# Patient Record
Sex: Male | Born: 1940
Health system: Southern US, Community
[De-identification: ages and names within clinical notes are randomized; demographics above are authoritative.]

## PROBLEM LIST (undated history)

## (undated) ENCOUNTER — Ambulatory Visit (HOSPITAL_COMMUNITY): Admission: EM | Source: Home / Self Care

## (undated) DIAGNOSIS — E785 Hyperlipidemia, unspecified: Secondary | ICD-10-CM

## (undated) DIAGNOSIS — A048 Other specified bacterial intestinal infections: Secondary | ICD-10-CM

## (undated) DIAGNOSIS — D126 Benign neoplasm of colon, unspecified: Secondary | ICD-10-CM

## (undated) DIAGNOSIS — M1990 Unspecified osteoarthritis, unspecified site: Secondary | ICD-10-CM

## (undated) DIAGNOSIS — F329 Major depressive disorder, single episode, unspecified: Secondary | ICD-10-CM

## (undated) DIAGNOSIS — F32A Depression, unspecified: Secondary | ICD-10-CM

## (undated) DIAGNOSIS — I2510 Atherosclerotic heart disease of native coronary artery without angina pectoris: Secondary | ICD-10-CM

## (undated) DIAGNOSIS — I10 Essential (primary) hypertension: Secondary | ICD-10-CM

## (undated) DIAGNOSIS — A498 Other bacterial infections of unspecified site: Secondary | ICD-10-CM

## (undated) DIAGNOSIS — I219 Acute myocardial infarction, unspecified: Secondary | ICD-10-CM

## (undated) DIAGNOSIS — K5790 Diverticulosis of intestine, part unspecified, without perforation or abscess without bleeding: Secondary | ICD-10-CM

## (undated) HISTORY — DX: Atherosclerotic heart disease of native coronary artery without angina pectoris: I25.10

## (undated) HISTORY — DX: Benign neoplasm of colon, unspecified: D12.6

## (undated) HISTORY — DX: Other bacterial infections of unspecified site: A49.8

## (undated) HISTORY — DX: Diverticulosis of intestine, part unspecified, without perforation or abscess without bleeding: K57.90

## (undated) HISTORY — PX: JOINT REPLACEMENT: SHX530

## (undated) HISTORY — PX: COLONOSCOPY: SHX174

## (undated) HISTORY — DX: Hyperlipidemia, unspecified: E78.5

## (undated) HISTORY — DX: Unspecified osteoarthritis, unspecified site: M19.90

## (undated) HISTORY — DX: Major depressive disorder, single episode, unspecified: F32.9

## (undated) HISTORY — PX: TOTAL KNEE ARTHROPLASTY: SHX125

## (undated) HISTORY — DX: Other specified bacterial intestinal infections: A04.8

## (undated) HISTORY — DX: Essential (primary) hypertension: I10

## (undated) HISTORY — DX: Depression, unspecified: F32.A

## (undated) HISTORY — DX: Acute myocardial infarction, unspecified: I21.9

## (undated) HISTORY — PX: TONSILLECTOMY: SUR1361

## (undated) HISTORY — PX: CARDIAC CATHETERIZATION: SHX172

## (undated) HISTORY — PX: CORONARY ANGIOPLASTY WITH STENT PLACEMENT: SHX49

---
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
## 1994-06-06 HISTORY — PX: OTHER SURGICAL HISTORY: SHX169

## 1998-01-02 ENCOUNTER — Ambulatory Visit (HOSPITAL_COMMUNITY): Admission: RE | Admit: 1998-01-02 | Discharge: 1998-01-02 | Payer: Self-pay

## 1998-01-22 ENCOUNTER — Ambulatory Visit (HOSPITAL_COMMUNITY): Admission: RE | Admit: 1998-01-22 | Discharge: 1998-01-22 | Payer: Self-pay

## 1998-02-17 ENCOUNTER — Ambulatory Visit (HOSPITAL_BASED_OUTPATIENT_CLINIC_OR_DEPARTMENT_OTHER): Admission: RE | Admit: 1998-02-17 | Discharge: 1998-02-17 | Payer: Self-pay | Admitting: Orthopaedic Surgery

## 1998-03-17 ENCOUNTER — Emergency Department (HOSPITAL_COMMUNITY): Admission: EM | Admit: 1998-03-17 | Discharge: 1998-03-17 | Payer: Self-pay | Admitting: Emergency Medicine

## 1998-03-17 ENCOUNTER — Encounter: Payer: Self-pay | Admitting: Emergency Medicine

## 1998-09-09 ENCOUNTER — Inpatient Hospital Stay (HOSPITAL_COMMUNITY): Admission: RE | Admit: 1998-09-09 | Discharge: 1998-09-12 | Payer: Self-pay | Admitting: Orthopaedic Surgery

## 1998-09-23 ENCOUNTER — Ambulatory Visit (HOSPITAL_COMMUNITY): Admission: RE | Admit: 1998-09-23 | Discharge: 1998-09-23 | Payer: Self-pay | Admitting: Orthopaedic Surgery

## 1998-10-19 ENCOUNTER — Ambulatory Visit (HOSPITAL_COMMUNITY): Admission: RE | Admit: 1998-10-19 | Discharge: 1998-10-19 | Payer: Self-pay | Admitting: Orthopaedic Surgery

## 2000-05-24 ENCOUNTER — Ambulatory Visit (HOSPITAL_COMMUNITY): Admission: RE | Admit: 2000-05-24 | Discharge: 2000-05-25 | Payer: Self-pay | Admitting: Cardiology

## 2000-05-24 ENCOUNTER — Encounter: Payer: Self-pay | Admitting: Cardiology

## 2000-07-07 ENCOUNTER — Ambulatory Visit (HOSPITAL_COMMUNITY): Admission: RE | Admit: 2000-07-07 | Discharge: 2000-07-07 | Payer: Self-pay | Admitting: Orthopedic Surgery

## 2000-07-07 ENCOUNTER — Encounter: Payer: Self-pay | Admitting: Orthopedic Surgery

## 2002-10-07 ENCOUNTER — Encounter: Payer: Self-pay | Admitting: Orthopedic Surgery

## 2002-10-07 ENCOUNTER — Ambulatory Visit (HOSPITAL_COMMUNITY): Admission: RE | Admit: 2002-10-07 | Discharge: 2002-10-07 | Payer: Self-pay | Admitting: Orthopedic Surgery

## 2003-12-30 ENCOUNTER — Encounter (INDEPENDENT_AMBULATORY_CARE_PROVIDER_SITE_OTHER): Payer: Self-pay | Admitting: *Deleted

## 2003-12-30 ENCOUNTER — Ambulatory Visit (HOSPITAL_COMMUNITY): Admission: RE | Admit: 2003-12-30 | Discharge: 2003-12-30 | Payer: Self-pay | Admitting: *Deleted

## 2004-06-09 ENCOUNTER — Ambulatory Visit: Payer: Self-pay | Admitting: Cardiology

## 2004-06-09 ENCOUNTER — Inpatient Hospital Stay (HOSPITAL_COMMUNITY): Admission: EM | Admit: 2004-06-09 | Discharge: 2004-06-10 | Payer: Self-pay | Admitting: Emergency Medicine

## 2004-06-21 ENCOUNTER — Ambulatory Visit: Payer: Self-pay | Admitting: Cardiology

## 2005-04-28 ENCOUNTER — Emergency Department (HOSPITAL_COMMUNITY): Admission: EM | Admit: 2005-04-28 | Discharge: 2005-04-28 | Payer: Self-pay | Admitting: Emergency Medicine

## 2005-09-22 ENCOUNTER — Ambulatory Visit: Payer: Self-pay | Admitting: Cardiology

## 2005-09-28 ENCOUNTER — Ambulatory Visit: Payer: Self-pay | Admitting: Cardiology

## 2006-05-24 ENCOUNTER — Encounter: Admission: RE | Admit: 2006-05-24 | Discharge: 2006-05-24 | Payer: Self-pay | Admitting: *Deleted

## 2006-11-30 ENCOUNTER — Ambulatory Visit: Payer: Self-pay | Admitting: Cardiology

## 2006-11-30 LAB — CONVERTED CEMR LAB
ALT: 17 units/L (ref 0–53)
AST: 18 units/L (ref 0–37)
Albumin: 3.7 g/dL (ref 3.5–5.2)
Alkaline Phosphatase: 76 units/L (ref 39–117)
Bilirubin, Direct: 0.1 mg/dL (ref 0.0–0.3)
Cholesterol: 244 mg/dL (ref 0–200)
Direct LDL: 156.7 mg/dL
HDL: 35.9 mg/dL — ABNORMAL LOW (ref >39.0)
Total Bilirubin: 1.2 mg/dL (ref 0.3–1.2)
Total CHOL/HDL Ratio: 6.8
Total Protein: 6.8 g/dL (ref 6.0–8.3)
Triglycerides: 226 mg/dL (ref 0–149)
VLDL: 45 mg/dL — ABNORMAL HIGH (ref 0–40)

## 2007-09-27 ENCOUNTER — Ambulatory Visit (HOSPITAL_COMMUNITY): Admission: RE | Admit: 2007-09-27 | Discharge: 2007-09-27 | Payer: Self-pay | Admitting: *Deleted

## 2007-09-27 ENCOUNTER — Encounter (INDEPENDENT_AMBULATORY_CARE_PROVIDER_SITE_OTHER): Payer: Self-pay | Admitting: *Deleted

## 2007-12-04 ENCOUNTER — Ambulatory Visit: Payer: Self-pay | Admitting: Cardiology

## 2007-12-10 ENCOUNTER — Ambulatory Visit: Payer: Self-pay | Admitting: Cardiology

## 2007-12-10 LAB — CONVERTED CEMR LAB
ALT: 15 units/L (ref 0–53)
AST: 20 units/L (ref 0–37)
Albumin: 3.5 g/dL (ref 3.5–5.2)
Alkaline Phosphatase: 81 units/L (ref 39–117)
Bilirubin, Direct: 0.1 mg/dL (ref 0.0–0.3)
Cholesterol: 222 mg/dL (ref 0–200)
Direct LDL: 159.1 mg/dL
HDL: 37.9 mg/dL — ABNORMAL LOW (ref >39.0)
Total Bilirubin: 0.7 mg/dL (ref 0.3–1.2)
Total CHOL/HDL Ratio: 5.9
Total Protein: 6.6 g/dL (ref 6.0–8.3)
Triglycerides: 131 mg/dL (ref 0–149)
VLDL: 26 mg/dL (ref 0–40)

## 2008-12-25 DIAGNOSIS — M129 Arthropathy, unspecified: Secondary | ICD-10-CM | POA: Insufficient documentation

## 2008-12-25 DIAGNOSIS — E785 Hyperlipidemia, unspecified: Secondary | ICD-10-CM

## 2008-12-25 DIAGNOSIS — F329 Major depressive disorder, single episode, unspecified: Secondary | ICD-10-CM

## 2008-12-25 DIAGNOSIS — I252 Old myocardial infarction: Secondary | ICD-10-CM | POA: Insufficient documentation

## 2008-12-25 DIAGNOSIS — I2510 Atherosclerotic heart disease of native coronary artery without angina pectoris: Secondary | ICD-10-CM

## 2008-12-25 DIAGNOSIS — I10 Essential (primary) hypertension: Secondary | ICD-10-CM | POA: Insufficient documentation

## 2008-12-29 ENCOUNTER — Ambulatory Visit: Payer: Self-pay | Admitting: Cardiology

## 2008-12-29 DIAGNOSIS — R519 Headache, unspecified: Secondary | ICD-10-CM | POA: Insufficient documentation

## 2008-12-29 DIAGNOSIS — R51 Headache: Secondary | ICD-10-CM

## 2008-12-31 ENCOUNTER — Telehealth (INDEPENDENT_AMBULATORY_CARE_PROVIDER_SITE_OTHER): Payer: Self-pay | Admitting: *Deleted

## 2009-01-01 ENCOUNTER — Ambulatory Visit: Payer: Self-pay

## 2009-01-01 ENCOUNTER — Encounter: Payer: Self-pay | Admitting: Internal Medicine

## 2009-01-27 ENCOUNTER — Ambulatory Visit: Payer: Self-pay | Admitting: Cardiology

## 2009-03-11 ENCOUNTER — Encounter (INDEPENDENT_AMBULATORY_CARE_PROVIDER_SITE_OTHER): Payer: Self-pay | Admitting: *Deleted

## 2009-03-12 ENCOUNTER — Ambulatory Visit: Payer: Self-pay | Admitting: Cardiology

## 2009-03-13 ENCOUNTER — Encounter: Admission: RE | Admit: 2009-03-13 | Discharge: 2009-03-13 | Payer: Self-pay | Admitting: Orthopedic Surgery

## 2009-06-23 ENCOUNTER — Ambulatory Visit: Payer: Self-pay | Admitting: Cardiology

## 2009-06-24 LAB — CONVERTED CEMR LAB
ALT: 19 units/L (ref 0–53)
AST: 21 units/L (ref 0–37)
Albumin: 3.8 g/dL (ref 3.5–5.2)
Alkaline Phosphatase: 86 units/L (ref 39–117)
Bilirubin, Direct: 0.1 mg/dL (ref 0.0–0.3)
Cholesterol: 181 mg/dL (ref 0–200)
HDL: 41.4 mg/dL (ref >39.00)
LDL Cholesterol: 116 mg/dL — ABNORMAL HIGH (ref 0–99)
Total Bilirubin: 1 mg/dL (ref 0.3–1.2)
Total CHOL/HDL Ratio: 4
Total Protein: 6.8 g/dL (ref 6.0–8.3)
Triglycerides: 117 mg/dL (ref 0.0–149.0)
VLDL: 23.4 mg/dL (ref 0.0–40.0)

## 2009-06-25 ENCOUNTER — Ambulatory Visit: Payer: Self-pay | Admitting: Cardiology

## 2009-08-29 ENCOUNTER — Ambulatory Visit (HOSPITAL_COMMUNITY): Admission: EM | Admit: 2009-08-29 | Discharge: 2009-08-29 | Payer: Self-pay | Admitting: Emergency Medicine

## 2009-12-21 ENCOUNTER — Encounter: Payer: Self-pay | Admitting: Cardiology

## 2010-03-02 ENCOUNTER — Ambulatory Visit: Payer: Self-pay | Admitting: Cardiology

## 2010-05-07 ENCOUNTER — Encounter: Payer: Self-pay | Admitting: Cardiology

## 2010-05-21 ENCOUNTER — Encounter: Payer: Self-pay | Admitting: Physician Assistant

## 2010-05-21 ENCOUNTER — Ambulatory Visit: Payer: Self-pay | Admitting: Cardiology

## 2010-06-11 ENCOUNTER — Telehealth (INDEPENDENT_AMBULATORY_CARE_PROVIDER_SITE_OTHER): Payer: Self-pay | Admitting: *Deleted

## 2010-06-17 ENCOUNTER — Inpatient Hospital Stay (HOSPITAL_COMMUNITY)
Admission: RE | Admit: 2010-06-17 | Discharge: 2010-06-19 | Payer: Self-pay | Source: Home / Self Care | Attending: Orthopedic Surgery | Admitting: Orthopedic Surgery

## 2010-06-21 LAB — DIFFERENTIAL
Basophils Absolute: 0 10*3/uL (ref 0.0–0.1)
Basophils Relative: 1 % (ref 0–1)
Eosinophils Absolute: 0.1 10*3/uL (ref 0.0–0.7)
Eosinophils Relative: 1 % (ref 0–5)
Lymphocytes Relative: 31 % (ref 12–46)
Lymphs Abs: 2 10*3/uL (ref 0.7–4.0)
Monocytes Absolute: 0.6 10*3/uL (ref 0.1–1.0)
Monocytes Relative: 10 % (ref 3–12)
Neutro Abs: 3.7 10*3/uL (ref 1.7–7.7)
Neutrophils Relative %: 57 % (ref 43–77)

## 2010-06-21 LAB — BASIC METABOLIC PANEL
BUN: 18 mg/dL (ref 6–23)
BUN: 16 mg/dL (ref 6–23)
CO2: 29 mEq/L (ref 19–32)
CO2: 27 mEq/L (ref 19–32)
Calcium: 8.1 mg/dL — ABNORMAL LOW (ref 8.4–10.5)
Calcium: 8.4 mg/dL (ref 8.4–10.5)
Chloride: 101 mEq/L (ref 96–112)
Chloride: 107 mEq/L (ref 96–112)
Creatinine, Ser: 1.15 mg/dL (ref 0.4–1.5)
Creatinine, Ser: 0.97 mg/dL (ref 0.4–1.5)
GFR calc Af Amer: >60 mL/min (ref >60)
GFR calc Af Amer: >60 mL/min (ref >60)
GFR calc non Af Amer: >60 mL/min (ref >60)
GFR calc non Af Amer: >60 mL/min (ref >60)
Glucose, Bld: 161 mg/dL — ABNORMAL HIGH (ref 70–99)
Glucose, Bld: 138 mg/dL — ABNORMAL HIGH (ref 70–99)
Potassium: 4.3 mEq/L (ref 3.5–5.1)
Potassium: 4.3 mEq/L (ref 3.5–5.1)
Sodium: 137 mEq/L (ref 135–145)
Sodium: 141 mEq/L (ref 135–145)

## 2010-06-21 LAB — CBC
HCT: 46.1 % (ref 39.0–52.0)
HCT: 33.7 % — ABNORMAL LOW (ref 39.0–52.0)
HCT: 31.6 % — ABNORMAL LOW (ref 39.0–52.0)
Hemoglobin: 15.3 g/dL (ref 13.0–17.0)
Hemoglobin: 11.1 g/dL — ABNORMAL LOW (ref 13.0–17.0)
Hemoglobin: 10.6 g/dL — ABNORMAL LOW (ref 13.0–17.0)
MCH: 30.6 pg (ref 26.0–34.0)
MCH: 30.2 pg (ref 26.0–34.0)
MCH: 30.3 pg (ref 26.0–34.0)
MCHC: 33.2 g/dL (ref 30.0–36.0)
MCHC: 32.9 g/dL (ref 30.0–36.0)
MCHC: 33.5 g/dL (ref 30.0–36.0)
MCV: 92.2 fL (ref 78.0–100.0)
MCV: 91.8 fL (ref 78.0–100.0)
MCV: 90.3 fL (ref 78.0–100.0)
Platelets: 229 10*3/uL (ref 150–400)
Platelets: 200 10*3/uL (ref 150–400)
Platelets: 211 10*3/uL (ref 150–400)
RBC: 5 MIL/uL (ref 4.22–5.81)
RBC: 3.67 MIL/uL — ABNORMAL LOW (ref 4.22–5.81)
RBC: 3.5 MIL/uL — ABNORMAL LOW (ref 4.22–5.81)
RDW: 14.4 % (ref 11.5–15.5)
RDW: 14.3 % (ref 11.5–15.5)
RDW: 14.6 % (ref 11.5–15.5)
WBC: 6.5 10*3/uL (ref 4.0–10.5)
WBC: 12.7 10*3/uL — ABNORMAL HIGH (ref 4.0–10.5)
WBC: 13.2 10*3/uL — ABNORMAL HIGH (ref 4.0–10.5)

## 2010-06-21 LAB — COMPREHENSIVE METABOLIC PANEL
ALT: 20 U/L (ref 0–53)
AST: 18 U/L (ref 0–37)
Albumin: 3.8 g/dL (ref 3.5–5.2)
Alkaline Phosphatase: 101 U/L (ref 39–117)
BUN: 16 mg/dL (ref 6–23)
CO2: 29 mEq/L (ref 19–32)
Calcium: 9.2 mg/dL (ref 8.4–10.5)
Chloride: 106 mEq/L (ref 96–112)
Creatinine, Ser: 1.02 mg/dL (ref 0.4–1.5)
GFR calc Af Amer: >60 mL/min (ref >60)
GFR calc non Af Amer: >60 mL/min (ref >60)
Glucose, Bld: 97 mg/dL (ref 70–99)
Potassium: 4.2 mEq/L (ref 3.5–5.1)
Sodium: 142 mEq/L (ref 135–145)
Total Bilirubin: 0.7 mg/dL (ref 0.3–1.2)
Total Protein: 7.4 g/dL (ref 6.0–8.3)

## 2010-06-21 LAB — URINALYSIS, ROUTINE W REFLEX MICROSCOPIC
Bilirubin Urine: NEGATIVE
Hgb urine dipstick: NEGATIVE
Ketones, ur: NEGATIVE mg/dL
Nitrite: NEGATIVE
Protein, ur: NEGATIVE mg/dL
Specific Gravity, Urine: 1.025 (ref 1.005–1.030)
Urine Glucose, Fasting: NEGATIVE mg/dL
Urobilinogen, UA: 1 mg/dL (ref 0.0–1.0)
pH: 5.5 (ref 5.0–8.0)

## 2010-06-21 LAB — PROTIME-INR
INR: 0.95 (ref 0.00–1.49)
Prothrombin Time: 12.9 seconds (ref 11.6–15.2)

## 2010-06-21 LAB — TYPE AND SCREEN
ABO/RH(D): A POS
Antibody Screen: NEGATIVE

## 2010-06-21 LAB — APTT: aPTT: 31 seconds (ref 24–37)

## 2010-06-21 LAB — SURGICAL PCR SCREEN
MRSA, PCR: NEGATIVE
Staphylococcus aureus: NEGATIVE

## 2010-06-21 LAB — ABO/RH: ABO/RH(D): A POS

## 2010-06-27 ENCOUNTER — Encounter: Payer: Self-pay | Admitting: Orthopedic Surgery

## 2010-07-04 LAB — CONVERTED CEMR LAB
ALT: 17 units/L (ref 0–53)
ALT: 19 units/L (ref 0–53)
AST: 18 units/L (ref 0–37)
AST: 21 units/L (ref 0–37)
Albumin: 3.7 g/dL (ref 3.5–5.2)
Albumin: 3.7 g/dL (ref 3.5–5.2)
Alkaline Phosphatase: 72 units/L (ref 39–117)
Alkaline Phosphatase: 86 units/L (ref 39–117)
BUN: 23 mg/dL (ref 6–23)
Basophils Absolute: 0 10*3/uL (ref 0.0–0.1)
Basophils Relative: 0.7 % (ref 0.0–3.0)
Bilirubin, Direct: 0 mg/dL (ref 0.0–0.3)
Bilirubin, Direct: 0.1 mg/dL (ref 0.0–0.3)
CO2: 28 meq/L (ref 19–32)
Calcium: 8.9 mg/dL (ref 8.4–10.5)
Chloride: 106 meq/L (ref 96–112)
Cholesterol: 166 mg/dL (ref 0–200)
Cholesterol: 179 mg/dL (ref 0–200)
Creatinine, Ser: 1 mg/dL (ref 0.4–1.5)
Eosinophils Absolute: 0.1 10*3/uL (ref 0.0–0.7)
Eosinophils Relative: 1.8 % (ref 0.0–5.0)
GFR calc non Af Amer: 79.31 mL/min (ref >60)
Glucose, Bld: 119 mg/dL — ABNORMAL HIGH (ref 70–99)
HCT: 44.3 % (ref 39.0–52.0)
HDL: 35.4 mg/dL — ABNORMAL LOW (ref >39.00)
HDL: 47.1 mg/dL (ref >39.00)
Hemoglobin: 14.8 g/dL (ref 13.0–17.0)
LDL Cholesterol: 102 mg/dL — ABNORMAL HIGH (ref 0–99)
LDL Cholesterol: 113 mg/dL — ABNORMAL HIGH (ref 0–99)
Lymphocytes Relative: 29.4 % (ref 12.0–46.0)
Lymphs Abs: 1.6 10*3/uL (ref 0.7–4.0)
MCHC: 33.3 g/dL (ref 30.0–36.0)
MCV: 91.8 fL (ref 78.0–100.0)
Monocytes Absolute: 0.6 10*3/uL (ref 0.1–1.0)
Monocytes Relative: 11 % (ref 3.0–12.0)
Neutro Abs: 3.2 10*3/uL (ref 1.4–7.7)
Neutrophils Relative %: 57.1 % (ref 43.0–77.0)
Platelets: 219 10*3/uL (ref 150.0–400.0)
Potassium: 5 meq/L (ref 3.5–5.1)
RBC: 4.83 M/uL (ref 4.22–5.81)
RDW: 13.9 % (ref 11.5–14.6)
Sodium: 141 meq/L (ref 135–145)
Total Bilirubin: 0.6 mg/dL (ref 0.3–1.2)
Total Bilirubin: 0.9 mg/dL (ref 0.3–1.2)
Total CHOL/HDL Ratio: 4
Total CHOL/HDL Ratio: 5
Total CK: 94 units/L (ref 7–232)
Total Protein: 6.9 g/dL (ref 6.0–8.3)
Total Protein: 7.1 g/dL (ref 6.0–8.3)
Triglycerides: 152 mg/dL — ABNORMAL HIGH (ref 0.0–149.0)
Triglycerides: 83 mg/dL (ref 0.0–149.0)
VLDL: 16.6 mg/dL (ref 0.0–40.0)
VLDL: 30.4 mg/dL (ref 0.0–40.0)
WBC: 5.5 10*3/uL (ref 4.5–10.5)

## 2010-07-06 NOTE — Assessment & Plan Note (Signed)
Summary: f13m   Visit Type:  Follow-up Primary Provider:  none at present   History of Present Illness: Able to exercise without difficulty.  No chest pain.  No shortness of breath.  Overall feels ok.  HIs primary raised his Lipitor.  Now on higher doses of medication.   Current Medications (verified): 1)  Meloxicam 7.5 Mg Tabs (Meloxicam) .... Take 1 Tablet By Mouth Two Times A Day 2)  Nexium 40 Mg Cpdr (Esomeprazole Magnesium) .... As Needed 3)  Aspirin 81 Mg Tbec (Aspirin) .... Take One Tablet By Mouth Daily 4)  Nitroglycerin 0.4 Mg Subl (Nitroglycerin) .... One Tablet Under Tongue Every 5 Minutes As Needed For Chest Pain---May Repeat Times Three 5)  Metoprolol Tartrate 50 Mg Tabs (Metoprolol Tartrate) .... Take  1/4 of The Tablet Two Times A Day 6)  Lipitor 80 Mg Tabs (Atorvastatin Calcium) .... Take One Tablet By Mouth Daily.  Allergies (verified): No Known Drug Allergies  Past History:  Past Medical History: Last updated: 12/25/2008 Current Problems:  CAD (ICD-414.00) HYPERTENSION (ICD-401.9) HYPERLIPIDEMIA (ICD-272.4) MYOCARDIAL INFARCTION, HX OF (ICD-412) DEPRESSION (ICD-311) ARTHRITIS (ICD-716.90)  Vital Signs:  Patient profile:   70 year old male Height:      72 inches Weight:      220 pounds BMI:     29.95 Pulse rate:   60 / minute BP sitting:   122 / 80  (left arm)  Vitals Entered By: Laurance Flatten CMA (March 02, 2010 9:12 AM)  Physical Exam  General:  Well developed, well nourished, in no acute distress. Head:  normocephalic and atraumatic Eyes:  PERRLA/EOM intact; conjunctiva and lids normal. Lungs:  Clear bilaterally to auscultation and percussion. Heart:  PMI non displaced.  Normal S1 and S2.  No murmur or rub.   Abdomen:  Bowel sounds positive; abdomen soft and non-tender without masses, organomegaly, or hernias noted. No hepatosplenomegaly. Msk:  Back normal, normal gait. Muscle strength and tone normal. Pulses:  pulses normal in all 4  extremities Extremities:  No clubbing or cyanosis. Neurologic:  Alert and oriented x 3.   EKG  Procedure date:  03/02/2010  Findings:      NSR.  LAD.  LVH.  No acute changes.  Impression & Recommendations:  Problem # 1:  CAD (ICD-414.00) Continues to remain stable.  No chest pain.  His updated medication list for this problem includes:    Aspirin 81 Mg Tbec (Aspirin) .Marland Kitchen... Take one tablet by mouth daily    Nitroglycerin 0.4 Mg Subl (Nitroglycerin) ..... One tablet under tongue every 5 minutes as needed for chest pain---may repeat times three    Metoprolol Tartrate 50 Mg Tabs (Metoprolol tartrate) .Marland Kitchen... Take  1/4 of the tablet two times a day  Problem # 2:  HYPERTENSION (ICD-401.9) controlled on fairly low dose therapy. His updated medication list for this problem includes:    Aspirin 81 Mg Tbec (Aspirin) .Marland Kitchen... Take one tablet by mouth daily    Metoprolol Tartrate 50 Mg Tabs (Metoprolol tartrate) .Marland Kitchen... Take  1/4 of the tablet two times a day  Problem # 3:  HYPERLIPIDEMIA (ICD-272.4) Lipitor was increased.  It was lowered in the setting of leg discomfort.  Will defer to Dr. Novella Olive to manage since he is seeing him more often.   His updated medication list for this problem includes:    Lipitor 80 Mg Tabs (Atorvastatin calcium) .Marland Kitchen... Take one tablet by mouth daily.  Other Orders: EKG w/ Interpretation (93000)  Patient Instructions: 1)  Your physician  recommends that you continue on your current medications as directed. Please refer to the Current Medication list given to you today. 2)  Your physician wants you to follow-up in: 1 YEAR.   You will receive a reminder letter in the mail two months in advance. If you don't receive a letter, please call our office to schedule the follow-up appointment.

## 2010-07-06 NOTE — Assessment & Plan Note (Signed)
Summary: ROV   Visit Type:  Follow-up Primary Provider:  none at present  CC:  Pt. is taking Lipitor now istead of Crestor due to side effects.  History of Present Illness: Patient could not take Crestor.  Back on Lipitor with LDL 116.  Has taken multiple meds in past including Zetia.  We discussed this in detail.  Sometimes he will have a flutter, but no chet pain.  Has had back issues and been followed at [facility] with shots, which have not helped.  They have not mentioned surgery.   Also knee is bone on bone.  Remains on Meloxicam.  Drugs reviewed in detail.  Current Medications (verified): 1)  Meloxicam 7.5 Mg Tabs (Meloxicam) .... Take 1 Tablet By Mouth Two Times A Day 2)  Nexium 40 Mg Cpdr (Esomeprazole Magnesium) .... As Needed 3)  Aspirin Ec 325 Mg Tbec (Aspirin) .... Take One Tablet By Mouth Daily 4)  Nitroglycerin 0.4 Mg Subl (Nitroglycerin) .... One Tablet Under Tongue Every 5 Minutes As Needed For Chest Pain---May Repeat Times Three 5)  Metoprolol Tartrate 50 Mg Tabs (Metoprolol Tartrate) .... Take  1/4 of The Tablet Two Times A Day 6)  Lipitor 40 Mg Tabs (Atorvastatin Calcium) .... Take 1 Tablet By Mouth Once A Day  At Bedtime  Allergies (verified): No Known Drug Allergies  Vital Signs:  Patient profile:   70 year old male Height:      72 inches Weight:      221.50 pounds BMI:     30.15 Pulse rate:   62 / minute Pulse rhythm:   regular Resp:     18 per minute BP sitting:   128 / 84  (left arm) Cuff size:   large  Vitals Entered By: Vikki Ports (June 25, 2009 9:02 AM)  Physical Exam  General:  Well developed, well nourished, in no acute distress. Head:  normocephalic and atraumatic Eyes:  PERRLA/EOM intact; conjunctiva and lids normal. Ears:  TM's intact and clear with normal canals and hearing Neck:  Neck supple, no JVD. No masses, thyromegaly or abnormal cervical nodes. Lungs:  Clear bilaterally to auscultation and percussion. Heart:  PMI  nondisplaced.  Normal S1 and S2.  No def murmur. Extremities:  No clubbing or cyanosis. Neurologic:  Alert and oriented x 3.   EKG  Procedure date:  06/25/2009  Findings:      NSR.Marland Kitchen Left axis deviation.  No acute changes  Impression & Recommendations:  Problem # 1:  CAD (ICD-414.00)  Stable.  Should reduce ASA to 81 mg.  Meloxicam discussed but patient needs.  No GI symptoms. His updated medication list for this problem includes:    Aspirin 81 Mg Tbec (Aspirin) .Marland Kitchen... Take one tablet by mouth daily    Nitroglycerin 0.4 Mg Subl (Nitroglycerin) ..... One tablet under tongue every 5 minutes as needed for chest pain---may repeat times three    Metoprolol Tartrate 50 Mg Tabs (Metoprolol tartrate) .Marland Kitchen... Take  1/4 of the tablet two times a day  Orders: EKG w/ Interpretation (93000)  Problem # 2:  HYPERLIPIDEMIA (ICD-272.4)  Tolerates only atorvastatin, so will continue.  Cannot take Zetia. The following medications were removed from the medication list:    Crestor 40 Mg Tabs (Rosuvastatin calcium) .Marland Kitchen... Take one tablet by mouth daily at bedtime His updated medication list for this problem includes:    Lipitor 40 Mg Tabs (Atorvastatin calcium) .Marland Kitchen... Take 1 tablet by mouth once a day  at bedtime  Orders: EKG w/  Interpretation (93000)  Patient Instructions: 1)  Your physician has recommended you make the following change in your medication: DECREASE Aspirin to 81mg  once a day 2)  Your physician recommends that you return for a FASTING LIPID and LIVER Profile in 6 MONTHS.  3)  Your physician wants you to follow-up in:  6 MONTHS.  You will receive a reminder letter in the mail two months in advance. If you don't receive a letter, please call our office to schedule the follow-up appointment. Prescriptions: LIPITOR 40 MG TABS (ATORVASTATIN CALCIUM) Take 1 tablet by mouth once a day  at bedtime  #30 x 11   Entered by:   Julieta Gutting, RN, BSN   Authorized by:   Ronaldo Miyamoto, MD,  [facility]   Signed by:   Julieta Gutting, RN, BSN on 06/25/2009   Method used:   Electronically to        [facility] [phone]* (retail)       [address]       [place], [place]  [address]       Ph: [phone] or [phone]       Fax: [phone]   RxID:   [id number]

## 2010-07-08 NOTE — Assessment & Plan Note (Signed)
Summary: surgical clearence/per notes on file/lg   Referring Quanta Roher:  Dr. Erasmo Leventhal Primary Cheree Fowles:  none at present  CC:  surgical clearnce knee replacement.  History of Present Illness: Primary Cardiologist:  Dr. Shawnie Pons  [name] is a 70 year old male with a history of coronary artery disease, status post anterior wall myocardial infarction in 1996 treated with stenting x2 to the LAD.  His last heart catheterization was done in January 2006 and demonstrated 40% stenosis in the proximal stent, 50% mid LAD stenosis and minimal nonobstructive disease in the circumflex and RCA.  He had apical hypokinesis on left ventriculogram with an EF of 50%.  His last Myoview study was done in July 2010.  It was overall low risk with prior distal anterior and apical infarct with trivial peri-infarct ischemia.  Ejection fraction was 50%.  He presents to the office today for surgical clearance.  He needs a total knee replacement on the right.  He denies any recent chest discomfort or shortness of breath.  He denies exertional chest heaviness or tightness.  He denies orthopnea or PND.  He denies significant pedal edema.  He denies syncope or near-syncope.  He is somewhat limited by his right knee pain.  He is able to sweep or vacuum without chest pain or shortness of breath.  He repairs lawnmowers as a hobby and is able to lift heavy machinery without chest pain or shortness of breath.  Current Medications (verified): 1)  Meloxicam 7.5 Mg Tabs (Meloxicam) .... Take 1 Tablet By Mouth Two Times A Day 2)  Nexium 40 Mg Cpdr (Esomeprazole Magnesium) .... As Needed 3)  Aspirin 81 Mg Tbec (Aspirin) .... Take One Tablet By Mouth Daily 4)  Nitroglycerin 0.4 Mg Subl (Nitroglycerin) .... One Tablet Under Tongue Every 5 Minutes As Needed For Chest Pain---May Repeat Times Three 5)  Metoprolol Tartrate 50 Mg Tabs (Metoprolol Tartrate) .... Take  1/4 of The Tablet Two Times A Day 6)  Lipitor 80 Mg Tabs  (Atorvastatin Calcium) .... Take One Tablet By Mouth Daily.  Allergies: No Known Drug Allergies  Past History:  Past Medical History: CAD (ICD-414.00) HYPERTENSION (ICD-401.9) HYPERLIPIDEMIA (ICD-272.4) MYOCARDIAL INFARCTION, HX OF (ICD-412) DEPRESSION (ICD-311) ARTHRITIS (ICD-716.90)  Past Surgical History: Reviewed history from 12/25/2008 and no changes required.  stent x2 in the LAD in 1996 with intra-aortic balloon pump support.  stent to the LAD in 1997. Knee surgery  Social History: Reviewed history from 12/25/2008 and no changes required.  He lives in [place] with his wife, he is retired, he has  one adult son alive and well.  No tobacco use, he quit in 1996.  His  exercise includes walking.  He denies any ETOH, drug or herbal medication use.  Review of Systems       He has right knee pain.  Otherwise as per  the HPI.  All other systems reviewed and negative.   Vital Signs:  Patient profile:   70 year old male Height:      72 inches Weight:      217 pounds BMI:     29.54 Pulse rate:   70 / minute Resp:     14 per minute BP sitting:   138 / 80  (left arm)  Vitals Entered By: Kem Parkinson (May 21, 2010 11:25 AM)  Physical Exam  General:  Well nourished, well developed, in no acute distress HEENT: normal Neck: no JVD Cardiac:  normal S1, S2; RRR; no murmur Lungs:  dry bibasilar crackles;  no wheezes Abd: soft, nontender, no hepatomegaly Ext: no edema Vascular: no carotid  bruits Skin: warm and dry Neuro:  CNs 2-12 intact, no focal abnormalities noted    EKG  Procedure date:  05/21/2010  Findings:      Normal Sinus Rhythm Heart rate 70 Leftward axis nonconducted PAC Poor R-wave progression Nonspecific T wave abnormality No significant change since previous tracing September 2011  Impression & Recommendations:  Problem # 1:  PRE-OPERATIVE CARDIOVASCULAR EXAMINATION (ICD-V72.81)  He is not having any unstable cardiac conditions.    He is able to achieve 4 METS or greater without angina. He had a low risk nuclear study in July 2010. According to the [facility] and AHA guidelines, he does not require further cardiac workup prior her to his noncardiac surgery.  He should be at acceptable risk.  His beta blocker should be continued throughout the perioperative period.  His aspirin should be resumed postoperatively as soon as it is felt to be safe.  Our service will certainly be available in the perioperative period as necessary.  Problem # 2:  CAD (ICD-414.00)  No angina. As above. Continue ASA.  Orders: EKG w/ Interpretation (93000)  Problem # 3:  HYPERTENSION (ICD-401.9)  Fair control.  Problem # 4:  HYPERLIPIDEMIA (ICD-272.4)  His updated medication list for this problem includes:    Lipitor 80 Mg Tabs (Atorvastatin calcium) .Marland Kitchen... Take one tablet by mouth daily.  Problem # 5:  ARTHRITIS (ICD-716.90) Right TKR pending with Dr. Priscille Kluver.  Patient Instructions: 1)  Your physician recommends that you schedule a follow-up appointment in: September 2012 with Dr. Riley Kill 2)  Your physician recommends that you continue on your current medications as directed. Please refer to the Current Medication list given to you today.

## 2010-07-08 NOTE — Letter (Signed)
Summary: [facility] - Pre-Op Clearance  [facility] - Pre-Op Clearance   Imported By: Marylou Mccoy 06/08/2010 14:06:57  _____________________________________________________________________  External Attachment:    Type:   Image     Comment:   External Document

## 2010-07-08 NOTE — Progress Notes (Signed)
  Phone Note From Other Clinic   Caller: [facility] Initial call taken by: Km    12 Lead faxed to [phone] [facility]  June 11, 2010 1:59 PM

## 2010-07-19 ENCOUNTER — Ambulatory Visit: Payer: Self-pay | Admitting: Physical Therapy

## 2010-07-23 NOTE — Discharge Summary (Signed)
[name], [name] NO.:  [id number]  MEDICAL RECORD NO.:  [id number]          PATIENT TYPE:  INP  LOCATION:  1423                         FACILITY:  [facility]  PHYSICIAN:  Naylin Burkle L. Rendall, M.D.  DATE OF BIRTH:  03/30/1941  DATE OF ADMISSION:  06/17/2010 DATE OF DISCHARGE:  06/19/2010                              DISCHARGE SUMMARY   ADMISSION DIAGNOSES: 1. End-stage osteoarthritis, right knee, status post left total knee     arthroplasty. 2. Coronary artery disease with history of myocardial infarction. 3. Depression. 4. Hypercholesterolemia.  DISCHARGE DIAGNOSES: 1. End-stage osteoarthritis, right knee, status post right total knee     arthroplasty. 2. History of left total knee arthroplasty. 3. Acute blood loss anemia secondary to surgery. 4. Coronary artery disease with history of myocardial infarction. 5. Depression. 6. Hypercholesterolemia.  SURGICAL PROCEDURES:  On June 17, 2010, Mr. [name] underwent a right total knee arthroplasty with computer navigation by Dr. Jonny Ruiz L. Rendall, assisted by Arnoldo Morale PA-C.  He had a DePuy primary femoral component cemented size large right placed with a tibial tray rotating platform, MBT keel size 4 cemented.  An LCS complete metal backed patella cemented size large and LCS complete tibial insert RP rotating platform 15 mm thickness.  COMPLICATIONS:  None.  CONSULTS:  Physical therapy consult; June 18, 2010.  HISTORY OF PRESENT ILLNESS:  This 70 year old white male patient presented with a 1 plus year history of gradual onset of progressive right knee pain.  He has had no injury or prior surgery to the knee but a history of left total knee in 1995 by Dr. Ophelia Nichols.  The right knee pain is now intermittent sharp to burning sensation over the anterior knee without radiation.  It increases with working and nothing makes it better.  The knee catches, pops, grinds, locks, gives way and keeps him up at night.   He has failed conservative treatment.  Because of that, he is presenting for right knee replacement.  HOSPITAL COURSE:  [name] tolerated his surgical procedure well but required reintubation in the operating room after he was originally extubated.  They felt it might be due to his sleep apnea.  A cardiology consult was obtained.  He was transferred to telemetry floor overnight for observation.  He did well.  They did not feel a complete cardiac eval was needed at that time, they felt probably it was more sleep apnea.  So, they recommended an oxygen at night and he was okay to go to the orthopedic floor.  At that point, he was afebrile, vitals were stable, hemoglobin 11.1, hematocrit 33.7.  He was able to get up out of bed and tolerate therapy.  He was weaned off oxygen but was to use that in the pulse ox at night.  His PCA was discontinued and also the Foley. He was started on therapy per protocol.  On postop day #2, he had had some problems with urinary retention a day before, he required an I and O cath but that did resolve.  He was afebrile, vitals were stable, hemoglobin 10.6, hematocrit 31.6. Incision was well  approximated.  Hemovac was discontinued.  It was felt he was doing well enough for discharge home and was discharged home later that day.  DISCHARGE INSTRUCTIONS:  DIET:  He is to resume his regular prehospitalization diet.  MEDICATIONS:  Please see the home patient med rec sheet for complete documentation of his medications but  we did add Celebrex to help with his pain, Robaxin 500 mg, Percocet 5/325 and Xarelto 10 mg.  He was to stop his Mobic at that time and hold the aspirin until his Xarelto was completed.  ACTIVITY:  He can be out of bed weightbearing as tolerated on the right leg with use of a walker.  No lifting or driving for 6 weeks.  Please see the white total joint discharge sheet for further activity instructions.  WOUND CARE:  Please see the white  total joint discharge sheet for further wound care instructions.  FOLLOWUP:  He is to follow up Dr. Priscille Kluver in our office on Tuesday June 29, 2010.  He needs to call [phone] for that appointment.  He is to follow up with Dr. Ronne Binning for sleep apnea study several weeks after surgery.  LABORATORY DATA:  Hemoglobin and hematocrit ranged from 15.3 and 46.1 on June 14, 2010, to 10.6 and 31.6 on June 19, 2010.  White count went from 6.5 on June 14, 2010, to 13.2 on June 19, 2010.  Glucose ranged from 97 on June 14, 2010, to 161 on June 18, 2010, to 138 on June 19, 2010.  Calcium dropped low of 8.1 on June 18, 2010.  All other laboratory studies were within normal limits.     [name], P.A.   ______________________________ [name]. Priscille Kluver, M.D.    KED/MEDQ  D:  06/29/2010  T:  06/29/2010  Job:  [id number]  Electronically Signed by Otilio Jefferson. on 06/30/2010 02:50:14 PM Electronically Signed by Erasmo Leventhal M.D. on 07/23/2010 01:23:13 PM

## 2010-08-30 LAB — POCT I-STAT, CHEM 8
Calcium, Ion: 1.11 mmol/L — ABNORMAL LOW (ref 1.12–1.32)
Chloride: 107 mEq/L (ref 96–112)
Glucose, Bld: 99 mg/dL (ref 70–99)
HCT: 45 % (ref 39.0–52.0)
Hemoglobin: 15.3 g/dL (ref 13.0–17.0)
Potassium: 3.7 mEq/L (ref 3.5–5.1)

## 2010-10-19 NOTE — Assessment & Plan Note (Signed)
[facility]                            CARDIOLOGY OFFICE NOTE   NAME:[name], [name]                   MRN:          [id number]  DATE:12/04/2007                            DOB:          03/08/1941    [name] is in for a followup visit.  Overall, he is doing well.  He has had  no chest pain, syncope, or presyncope.  He feels good.   CURRENT MEDICATIONS:  1. Enteric-coated aspirin 325 mg daily.  2. Mobic 75 b.i.d.  3. Fluoxetine 20 mg daily.  4. Lipitor 40 mg daily.  5. Metoprolol 50 mg one-fourth tablet b.i.d.   PHYSICAL EXAMINATION:  VITAL SIGNS:  The blood pressure is 122/70.  The  pulse is 60.  LUNG:  Fields are clear.  CARDIAC:  Rhythm is really quite regular.   Pulse electrocardiogram demonstrates normal sinus rhythm, borderline  interventricular conduction delay and is essentially within normal  limits.  When compared to the previous tracings from June 2008, there is  not a significant interval change.   Last catheterization in 2006 suggested patent stents in the proximal  LAD, 50% distal lesion in the LAD, 30% circ, 30% RCA and ejection  fraction estimated 50%.   IMPRESSION:  1. Coronary artery disease with prior cardiac catheterization as noted      above.  2. Hypercholesterolemia, on lipid lowering therapy.  3. Arthritis on nonsteroidal anti-inflammatory drugs with the patient      understanding potential drug issues.  4. Significant arthritis with history of knee replacement.   PLAN:  Lipid and liver profile.  Return to clinic in 6 months to 1 year.     Arturo Morton. Riley Kill, MD, [facility]  Electronically Signed    TDS/MedQ  DD: 01/19/2008  DT: 01/20/2008  Job #: [id number]

## 2010-10-19 NOTE — Letter (Signed)
November 29, 2006    Georgiana Spinner, M.D.  [address]  [place], [place] [address]   RE:  [name], [name]  MRN:  [id number]  /  DOB:  [birth year]   Dear Greggory Stallion:   I had the pleasure of seeing [name] in the office today in follow-  up.  He has seen you previously for both upper endoscopy and  colonoscopy.  He has had some fairly significant esophageal symptoms.  When he eats, his chest feels entirely full.  He says it feels like  things get stuck.  This is no different for liquids or food.  I do not  provide his primary care but given the nature of his symptoms, I thought  it would be appropriate for him to see you back in follow-up.  This  gentleman has known coronary artery disease and has been followed for  multivessel intervention.  Finally, the patient is also on a  nonsteroidal, I believe from his primary care physician.  I have  instructed him to make an appointment to see you.    Sincerely,      Arturo Morton. Riley Kill, MD, [facility]  Electronically Signed    TDS/MedQ  DD: 11/29/2006  DT: 11/30/2006  Job #: [id number]

## 2010-10-19 NOTE — Assessment & Plan Note (Signed)
[facility]                            CARDIOLOGY OFFICE NOTE   NAME:[name], [name]                   MRN:          [id number]  DATE:11/29/2006                            DOB:          10/21/1940    One year followup for lipid profile.   Mr. [name] is in for followup.  Clinically he is doing well.  He and I  had a discussion today about his nonsteroidal inflammatories.  I  mentioned to him about the potential problem associated with this.  He  remains on aspirin, but he takes really about every other day.  With  regard to his lipids his last LDL was moderately elevated on Lipitor 40,  but he did not really want to change this dose.  He has had some  problems particularly with swallowing food.  He says it lodges in his  esophagus and does not go down.  He had an endoscopy done about three  years ago with Dr. Virginia Rochester, but has not had anything in the interim.   CURRENT MEDICATIONS:  1. Enteric-coated aspirin 325 mg probably every other day.  2. Nexium 40 mg daily.  3. Mobic 7.5 daily.  4. Fluoxetine 20 mg daily.  5. Lipitor 40 daily.  6. Metoprolol 50 mg 1/4 b.i.d.   PHYSICAL EXAMINATION:  VITAL SIGNS:  Blood pressure 122/76, pulse 59.  LUNGS:  Lung fields are clear.  CARDIAC:  Rhythm is regular.  I do not appreciate a significant murmur.   The patient's electrocardiogram demonstrates normal sinus rhythm/sinus  bradycardia with nonspecific T wave abnormality.   IMPRESSION:  1. Coronary artery disease with last diagnostic cardiac      catheterization in 2006 with 40% LAD, 30% circumflex, and 30% right      coronary artery with 50% distal LAD.  2. Hypercholesterolemia.  Lipid lowering therapy but not at target.  3. Arthritis on nonsteroidal anti-inflammatory drugs.  4. Significant arthritis with knee replacement.   RECOMMENDATIONS:  1. I discussed the possibility of doing exercise tolerance test, but      he does not think he can do this.  2.  We had a discussion today about his nonsteroidal anti-      inflammatories.  He needs them, although he said note he      potentially might be able to get along without them.  They are      associated with increased risk of heart attack, and I mentioned      this to him in detail.  3. We talked about his lipid profile.  We will get the liver checked.      We will call him with results.     Arturo Morton. Riley Kill, MD, [facility]  Electronically Signed    TDS/MedQ  DD: 11/29/2006  DT: 11/30/2006  Job #: [id number]

## 2010-10-19 NOTE — Op Note (Signed)
[name], [name] NO.:  [id number]   MEDICAL RECORD NO.:  [id number]          PATIENT TYPE:  AMB   LOCATION:  ENDO                         FACILITY:  [facility]   PHYSICIAN:  Georgiana Spinner, M.D.    DATE OF BIRTH:  08/27/1940   DATE OF PROCEDURE:  09/27/2007  DATE OF DISCHARGE:                               OPERATIVE REPORT   PROCEDURE:  Upper endoscopy.   INDICATIONS:  Foreign body sensation in the throat, odynophagia.   ANESTHESIA:  Fentanyl 75 mcg, Versed 5 mg.   PROCEDURE:  With the patient mildly sedated in the left lateral  decubitus position, the Pentax videoscopic endoscope was inserted and in  the mouth and passed under direct vision through the esophagus, which  appeared normal on first view.  We did not get a good view of the  squamocolumnar junction.  We entered into the stomach, fundus, body,  antrum, duodenal bulb, second portion duodenum appeared normal.  From  this point the endoscope was slowly withdrawn, taking circumferential  views of duodenal mucosa until the endoscope had been pulled back in the  stomach and placed in retroflexion to view the stomach from below.  The  endoscope was then straightened and withdrawn, taking circumferential  views of the remaining gastric and esophageal mucosa stopping in the  distal esophagus to biopsy the squamocolumnar junction.  The endoscope  was then withdrawn taking circumferential views of the remaining  esophageal mucosa, stopping to photograph, along they way, what appeared  to be normal mucosa and a small inlet patch in the most proximal  esophagus.  The endoscope was withdrawn.  The patient's vital signs and  pulse oximeter remained stable.  The patient tolerated procedure well  without apparent complications.   FINDINGS:  Essentially negative examination.  Biopsies taken of the  squamocolumnar junction to evaluate for esophagitis.  Await biopsy  report.  The patient will call me for results and  follow-up with me as  an outpatient.           ______________________________  Georgiana Spinner, M.D.     GMO/MEDQ  D:  09/27/2007  T:  09/27/2007  Job:  [id number]

## 2010-10-22 NOTE — Cardiovascular Report (Signed)
NAME:  GRANVILLE, WHITEFIELD NO.:  [id number]   MEDICAL RECORD NO.:  [id number]          PATIENT TYPE:  INP   LOCATION:  3731                         FACILITY:  [facility]   PHYSICIAN:  Charlies Constable, M.D. [facility] DATE OF BIRTH:  [birth year]   DATE OF PROCEDURE:  06/10/2004  DATE OF DISCHARGE:                              CARDIAC CATHETERIZATION   CLINICAL HISTORY:  Mr. Archuleta is 70 years old and suffered an anterior wall  infarction in 1996 treated with PCI of the LAD by Dr. Riley Kill.  He  subsequently had two Palmaz-Schatz stents placed in the proximal LAD.  He  has done well since that time.  Over the last few weeks, he has had  shortness of breath with exertion and some associated chest pain.  These  symptoms became worse yesterday and he is admitted by Dr. Andee Lineman with a  diagnosis of a possible acute coronary syndrome.   PROCEDURE:  The procedure was performed by the right femoral artery and  arterial sheath and 6 French preformed coronary catheters.  A femoral  arterial puncture was performed and non-opaque contrast was used.  A distal  aortogram was performed to rule out abdominal aortic aneurysm.  The right  femoral artery was closed with Angio-Seal at the end of the procedure.  The  patient tolerated the procedure well and left the laboratory in satisfactory  condition.   RESULTS:  Aortic pressure was 133/78 with a mean of 101.  The left  ventricular pressure was 133/14.   The left main coronary artery:  The left main coronary artery  is free of  significant disease.   The left anterior descending artery:  The left anterior descending gave rise  to two sets of perfs and two diagonal branches.  There were tan and non-  overlying stents in the proximal LAD.  There was 4% narrowing within the  first stent and less than 10% narrowing at the second stent.  There was 30%  narrowing in the proximal LAD after the two stents and there was 50%  narrowing in the mid LAD after  the second diagonal branch.   The circumflex:  The circumflex gave rise to an atrial branch, a small  marginal branch and a large posterolateral branch.  There was 30% narrowing  in the proximal portion of the circumflex.   The right coronary artery:  The right coronary artery was a dominant vessel  giving rise to a posterior descending and posterolateral branch.  There was  30% narrowing in the proximal vessel and irregularity in the proximal mid  vessel.  There was no major obstruction.   The left ventriculogram:  The left ventriculogram was performed in the RAO  projection, showed hypokinesis of the apex.  The overall wall motion was  good and the estimated ejection fraction was 50%.   Distal aortogram:  A distal aortogram showed patent renal arteries and no  significant aortoiliac obstruction.   CONCLUSION:  Coronary artery disease status post prior anterior wall  infarction, prior placement of non-overlapping stents in the proximal left  anterior descending with  40% narrowing at the proximal stent in the proximal  left anterior descending, 50% narrowing in the mid left anterior descending,  30% narrowing in the proximal circumflex right atrium, 30% narrowing in the  proximal right coronary artery and apical wall hypokinesis with an estimated  ejection fraction of 50%.   RECOMMENDATIONS:  The patient has nonobstructive coronary disease and I  doubt his admission symptoms are related to myocardial ischemia.  His left  ventricular function is fairly well preserved and his LVDP is normal.  I  will plan to evaluate him with a D-dimer and if it is positive, will  consider a spiral CT scan to evaluate the possibility of pulmonary embolus  to explain his shortness of breath.  If the D-dimer is negative, then will  plan discharge and arrange follow up with Dr. Riley Kill who can decide further  as an outpatient.       BB/MEDQ  D:  06/10/2004  T:  06/10/2004  Job:  [id number]   cc:   Rande Lawman, M.D.   Arturo Morton. Riley Kill, M.D. [facility]

## 2010-10-22 NOTE — Discharge Summary (Signed)
[place]  Patient:    [name], [name]                        MRN: [id number] Adm. Date:  [id number] Disc. Date: 05/25/00 Attending:  Ronaldo Miyamoto Dictator:   Joellyn Rued, P.A.-C. CC:         Helene Kelp, M.D.   Discharge Summary  DATE OF BIRTH:  [birth year]  HISTORY OF PRESENT ILLNESS:  Mr. [name] is a 70 year old white male who presented to the office on May 23, 2000, complaining of mild chest discomfort, shortness of breath, and generalized weakness/fatigue.  PAST MEDICAL HISTORY:  1. Anterior myocardial infarction with stenting in 1996, complicated     by a cardiac arrest.  2. Intra-aortic balloon pump in January 1997.  3. A relook cardiac catheterization showed a stenosis.  He underwent     stenting without complications.  His ejection fraction at that time     was 36%.  4. In May 1997, a cardiac catheterization showed no significant restenosis.  5. In 1998, no restenosis.  6. Last cardiac catheterization showed an ejection fraction of 52%.  7. A recent Cardiolyte study showed anteroapical scarring with some     peri-infarction ischemia, with the ejection fraction of 44%.  Thus     Dr. Arturo Morton. [name] recommended a cardiac catheterization .  8. He also has a history of hyperlipidemia.  9. Arthritis. 10. Family history. 11. Remote tobacco use.  LABORATORY DATA:  In the office on May 19, 2000, preadmission labs showed an H&H of 15.3, and 46.4, normal indices, platelets 280, wbcs 5.4.  PTT 23.1, PT 10.9.  Sodium 135, potassium 4.7, BUN 13, creatinine 1.2, glucose 97.  It is noted that his last liver function tests were checked in February 2001. The last lipids were done at that time too.  Cholesterol 187, triglycerides 183, HDL 49.8, LDL 101.  HOSPITAL COURSE:  A cardiac catheterization performed by Dr. Riley Kill on May 24, 2000, showed a 30% proximal RCA, irregularities to the proximal mid-RCA.  The  LAD at the prior stent showed a 30% restenosis, a 20%-30% stenosis post the stent, and a 50% mid-LAD.  Anteroapical hypokinesis to akinesis, with an ejection fraction of 48%.  Post-sheath removal and bedrest, the cardiac catheterization was intact.  After reviewing the films, Dr. Riley Kill felt that continued medical treatment was in order.  In regards to his fatigue and generalized weakness, and muscle cramps, Dr. Riley Kill felt that a trial of decreasing his Lopressor to 1/4 tablet b.i.d., and holding his Lipitor until being seen in the office was in order.  DISPOSITION:  After reviewing the chart on May 25, 2000, it was felt that he could be discharged home.  DISCHARGE DIAGNOSES: 1. Nonobstructive coronary artery disease. 2. Generalized fatigue. 3. Dyspnea on exertion. 4. Muscle cramping.  DISCHARGE MEDICATIONS: 1. Coated aspirin 325 mg q.d. 2. Tagamet 400 mg q.d. 3. __________ 75 mg b.i.d. 4. Diclofenac 75 mg b.i.d. 5. Celexa 40 mg q.d. 6. Sublingual nitroglycerin p.r.n. 7. Lopressor decreased to 12.5 mg b.i.d.  INSTRUCTIONS:  He was asked not to take his Lipitor until seen in the office. He was advised no lifting, driving, sexual activity, or heavy exertion for two days.  If he has any problems with his cardiac catheterization site, he is asked to call.  DIET:  To maintain a low-fat, low-salt, low-cholesterol diet.  FOLLOWUP:  He will see Irving Burton  Wilson, P.A.-C. on June 14, 2000, at 10 a.m. in the office for followup, in regards to muscle cramping and fatigue (to discover if there has been any improvement with his medication changes). DD:  05/25/00 TD:  05/25/00 Job: [id number]

## 2010-10-22 NOTE — H&P (Signed)
NAMEARJAY, JASKIEWICZ NO.:  [id number]   MEDICAL RECORD NO.:  [id number]          PATIENT TYPE:  EMS   LOCATION:  MAJO                         FACILITY:  [facility]   PHYSICIAN:  Learta Codding, M.D. LHCDATE OF BIRTH:  May 05, 1941   DATE OF ADMISSION:  06/09/2004  DATE OF DISCHARGE:                                HISTORY & PHYSICAL   CARDIOLOGIST:  Arturo Morton. Riley Kill, M.D. [facility]   PRIMARY CARE PHYSICIAN:  Chase Picket, M.D.   CHIEF COMPLAINT:  Chest pain and shortness of breath.   HISTORY OF PRESENT ILLNESS:  [name] presents to [facility] Emergency  Room with complaints of increased shortness of breath times several weeks.  He also complains of chest discomfort.  He describes it as intermittent  tightness in the midsternal area, localized.  He rated the pain a 6 on a  scale of 1-10 initially.  He is currently rating the pain a 5 on a scale of  1-10.  He states the shortness of breath has gradually gotten worse over the  last few weeks.  Mr. Liou is a walker, he has to stop he states at about  half a mile due to increased shortness of breath.  He also complains of  decreased energy level and increased diaphoresis with any activity.  However, his chest tightness does not change with any activities.  Positive  for shortness of breath, negative for nausea, positive for lightheadedness.   ALLERGIES:  NO KNOWN DRUG ALLERGIES.   MEDICATIONS:  1.  Crestor 20 mg p.o. daily.  2.  Mobic 7.5 mg p.o. daily.  3.  Lopressor 25 mg p.o. b.i.d.  4.  Nexium 40 mg p.o. b.i.d.  5.  Prozac 20 mg p.o. b.i.d.  6.  Aspirin 325 mg daily.   Mr. Sokolowski has a prior cardiac history, his last catheterization was in 2001  and he recently had a colonoscopy with biopsy and polypectomy along with an  endoscopy that was relatively negative.  His past cardiac history includes a  myocardial infarction which led to cardiac arrest in 1996 at which time he  had three stents to the LAD.  Mr. Tribby  also required an intra-aortic  balloon pump in January 1997 with a repeat cardiac catheterization in May  1997 and in 1998.  His last cardiac catheterization in 2001 showed an  ejection fraction of 52%.  Cardiolite study in 2001 showed an EF of 44% with  anterior apical scarring and some peri-infarction ischemia.  At that time,  Mr. Sweetser had his last cardiac catheterization which showed nonobstructive  coronary artery disease.  His other history includes hyperlipidemia,  hypertension, arthritis, depression.   SOCIAL HISTORY:  He lives in [place] with his wife, he is retired, he has  one adult son alive and well.  No tobacco use, he quit in 1996.  His  exercise includes walking.  He denies any ETOH, drug or herbal medication  use.   DIET:  He states he is supposed to be following a heart smart diet but he is  not compliant with that.  FAMILY HISTORY:  Mother deceased in her 74s from MI, father deceased at age  81, unknown cause.  He had a brother who is deceased at age 35 from MI and  sister deceased from cancer.   REVIEW OF SYSTEMS:  Positive for sweats.  CARDIOPULMONARY:  Positive for  chest pain, shortness of breath, dyspnea on exertion, orthopnea  occasionally, paroxysmal nocturnal dyspnea occasionally, palpitations, and a  dry cough.  GU:  Positive for nocturia two times a night.  NEURO PSYCH:  Positive for depression, controlled with Prozac.  MS:  He complains of  aching in bilateral legs.  GI:  Complains of recent abdominal pain prior to  negative colonoscopy.   PHYSICAL EXAMINATION:  VITAL SIGNS:  Temperature 96.8, pulse 81,  respirations 20, blood pressure 144/96, he is saturating 98% on room air.  GENERAL:  He is alert and oriented and in no acute distress.  HEENT:  Pupils equal round and reactive to light.  NECK:  Supple, without lymphadenopathy, negative bruit, negative JVD.  CVS:  Heart regular rate and rhythm and S1/S2.  LUNGS:  Crackles bilateral bases.   ABDOMEN:  Soft, nontender, positive bowel sounds.  EXTREMITIES:  No clubbing, cyanosis or edema.  Negative for petechiae or  rash.  MS:  No joint deformities, no CVA tenderness.  NEUROLOGIC:  Alert and oriented x3, cranial nerves II-XII grossly intact.   Chest x-ray:  Minimal left basilar __________ Orlan Leavens.  EKG:  Rate of 62,  sinus rhythm, intervals PR 171, QRS 100, QTC 474.   LABORATORY DATA:  Pending.   Dr. Andee Lineman in to see patient, plan of care as follows:   PROBLEM LIST:  1.  Chest pain, acute coronary syndrome.  Electrocardiogram negative,      troponin and enzymes are pending.  2.  Coronary artery disease.  Status post PCI stent x3 to the left anterior      descending.  3.  Hypertension.  4.  Bilateral leg pain.  5.  Dyslipidemia.  6.  Rule out metabolic syndrome.  7.  Arthritis, on Mobic.  8.  Mild left ventricular dysfunction.  9.  Physical exam:  Patient has bilateral bibasilar crackles, however, chest      x-ray showing minimal atelectasis.   PLAN:  Admit patient to telemetry unit, cycle cardiac enzymes, we will treat  him with Plavix 600  mg now and then 75 mg daily.  We will also initiate a  heparin drip, the patient is already on a beta-blocker.  We have started IV  nitroglycerin for pain control.  Also give him low-dose morphine IV.  We  will initiate an ACE inhibitor for CHF on exam/questionable LV dysfunction,  lisinopril 10 mg p.o. daily, we will also check a BNP level.  We will also  get a venous lower extremity Doppler and check ABIs for leg pain.  The  patient is already on Crestor, we will also check a hemoglobin A1c to rule  out metabolic syndrome.  The patient has been scheduled for cardiac  catheterization to reexamine coronary artery disease, he is the first case  scheduled for tomorrow morning.   ATTENDING ADDENDUM: I have seen and examined the patient. I agree with above  evaluation  and plan. Peyton Bottoms, MD, [facility]   MB/MEDQ  D:   06/09/2004  T:  06/09/2004  Job:  [id number]   cc:   Arturo Morton. Riley Kill, M.D. [facility]   Chase Picket, M.D.

## 2010-10-22 NOTE — Op Note (Signed)
NAME:  [name], [name] NO.:  [id number]   MEDICAL RECORD NO.:  [id number]                   PATIENT TYPE:  AMB   LOCATION:  ENDO                                 FACILITY:  [facility]   PHYSICIAN:  Georgiana Spinner, M.D.                 DATE OF BIRTH:  November 20, 1940   DATE OF PROCEDURE:  DATE OF DISCHARGE:                                 OPERATIVE REPORT   PROCEDURE:  Colonoscopy with biopsy and polypectomy.   INDICATIONS:  Colon polyps.   ANESTHESIA:  Demerol 20, Versed 2 mg.   DESCRIPTION OF PROCEDURE:  With the patient mildly sedated in the left  lateral decubitus position, the Olympus video scopic colonoscope was  inserted in the rectum and passed under direct vision, eventually with the  second scope.  The first scope, we could only get to the ascending colon  despite multiple positioning changes and pressure applied to the abdomen.  We had to withdraw this colonoscope.  It was a CF-140 and subsequently the  CF-160 AL variable stiffness scope was inserted in the rectum and passed  under direct vision with pressure applied and the patient turned multiply.  Finally, on her right side were able to reach the cecum identified by the  ileocecal valve and appendiceal orifice, both of which were photographed.  About one fold adjacent to the ileocecal valve was a polyp which was  photographed and biopsied or removed using hot biopsy forceps technique,  setting of 20/200 with the Erby pulse generator.  Tissue was retrieved.  The  endoscope was withdrawn, taking circumferential views of the remaining  colonic mucosa as we withdrew to the rectum, stopping at the sigmoid colon  to photograph diverticula along the way until we saw a polyp at 25 cm from  the anal verge which was photographed and removed using snare cautery  technique in a setting of 20/200 blended current.  In the rectum, the  endoscope was placed on retroflexion to view the anal canal from above.  Internal hemorrhoids were seen and photographed.  The endoscope was  straightened and withdrawn.  The patient's vital signs and pulse oximeter  remained stable.  The patient tolerated the procedure well without apparent  complications.   FINDINGS:  Polyps at 25 cm from the anal verge and in the cecum.  Diverticulosis of the sigmoid colon.  Internal hemorrhoids.   PLAN:  Await biopsy report.  The patient will call me for results and follow  up with me as an outpatient.                                               Georgiana Spinner, M.D.    GMO/MEDQ  D:  12/30/2003  T:  12/30/2003  Job:  [id number]

## 2010-10-22 NOTE — Op Note (Signed)
NAME:  DRAGO, HAMMONDS NO.:  [id number]   MEDICAL RECORD NO.:  [id number]                   PATIENT TYPE:  AMB   LOCATION:  ENDO                                 FACILITY:  [facility]   PHYSICIAN:  Georgiana Spinner, M.D.                 DATE OF BIRTH:  12-10-1940   DATE OF PROCEDURE:  DATE OF DISCHARGE:                                 OPERATIVE REPORT   PROCEDURE:  Upper endoscopy with biopsy.   INDICATIONS FOR PROCEDURE:  GERD.   ANESTHESIA:  Demerol 50, Versed 5 mg.   DESCRIPTION OF PROCEDURE:  With the patient mildly sedated in the left  lateral decubitus position, the Olympus videoscopic endoscope was inserted  in the mouth, passed under direct vision through the esophagus into the  stomach.  Fundus, body, antrum, duodenal bulb, and second portion of  duodenum were visualized.  From this point, the endoscope was slowly  withdrawn, taking circumferential views of the duodenal mucosa until the  endoscope was pulled back into the stomach, placed in retroflexion to view  the stomach from below.  The endoscope was then straightened and withdrawn,  taking circumferential views of the remaining gastric and esophageal mucosa.  Distal esophagus could never be well seen because of the folds.  I could not  tell for certain if there was Barrett's here so I elected to biopsy this  area as best I could around the squamocolumnar junction.  The endoscope was  then withdrawn.  The patient's vital signs and pulse still remained stable.  The patient tolerated the procedure well without apparent complications.   FINDINGS:  Unremarkable examination, limited at the squamocolumnar junction  because of the thickness of the folds.  Biopsies taken.  Await biopsy  report.  The patient will call me with results and follow up with me as an  outpatient.  Proceed to colonoscopy as planned.                                               Georgiana Spinner, M.D.    GMO/MEDQ  D:   12/30/2003  T:  12/30/2003  Job:  [id number]

## 2010-10-22 NOTE — Discharge Summary (Signed)
[name], [name] NO.:  [id number]   MEDICAL RECORD NO.:  [id number]          PATIENT TYPE:  INP   LOCATION:  3731                         FACILITY:  [facility]   PHYSICIAN:  [name], P.A. DATE OF BIRTH:  07/07/1940   DATE OF ADMISSION:  06/09/2004  DATE OF DISCHARGE:  06/10/2004                                 DISCHARGE SUMMARY   DISCHARGE DIAGNOSES:  1.  Admitted with chest pain and progressive, marked dyspnea which has been      intermittent for the last several weeks accompanied by diaphoresis      (There is no increase in dyspnea with activity.).  The patient has a      history of ventricular fibrillation cardiac arrest in 1996.  2.  Status post stent x2 in the LAD in 1996 with intra-aortic balloon pump      support.  3.  Status post stent to the LAD in 1997.  4.  Patient was ruled out for myocardial infarction by serial cardiac      enzymes this admission, 0.01, then less than 0.01, then less than 0.01.  5.  Left heart catheterization showing no source of ischemia.  There is a      40% instant stenosis in the first and second stent in the LAD.  There is      less than 105 stenosis in the third stent of the LAD.  6.  Ejection fraction 50% by left heart catheterization June 10, 2004.  7.  D. dimer this admission less than 0.22.  8.  Chest x-ray shows left lower lobe atelectasis.  9.  Possibly add ACE-1 as an outpatient.  10. Ankle brachial indexes greater than 1.0 bilaterally.   SECONDARY DIAGNOSES:  1.  History of myocardial infarction with ventricular fibrillation at rest,      placement of intra-aortic balloon pump in 1996.  2.  Status post stents x2 to the LAD in 1996.  3.  Status post stent to the LAD x1 in 1997.  4.  Hypertension.  5.  Dyslipidemia.  6.  Arthritis.  7.  Depression.   PROCEDURES:  1.  June 10, 2004, left heart catheterization.  Study shows that the LAD      had a 40% stenosis at the first stent placed in the LAD and  then a 50%      mid point stenosis after the third stent which itself has less than 10%      stenosis.  Left circumflex had a 30% mid point stenosis.  The right      coronary artery had a 30% proximal stenosis.  There is a 50% distal      stenosis in the LAD after the second diagonal, and the left ventricular      ejection fraction is about 50% with anterior hypokinesis.  2.  Ankle brachial indexes greater than 1.0 bilaterally.  This study was      done June 10, 2004.   DISPOSITION:  This patient has been ruled out for myocardial infarction.  On  admission, he has undergone left heart  catheterization which shows no  significant obstruction.  No source of ischemia in this patient who has had  prior stenting.  The D. dimer is less than 0.22 and the chest x-ray shows  only a left lower lobe atelectasis.  The patient does claim that he gets  short of breath at times with paroxysmal effect.  His dyspnea will come on  very suddenly and will last transiently, but during that time it is quite  worrisome.  He also has gotten to the point where he can only walk about one  half mile before becoming short of breath.  He stated that on admission he  was unable to walk from his bed to the bathroom in his hospital room without  feeling some dyspnea.  At this hospitalization, a cardiac source and an  acute pulmonary source have been ruled out.  If the patient has further  difficulties, these will be addressed as an outpatient, either when he sees  Dr. Riley Nichols in follow up or with his primary care giver.   DISCHARGE MEDICATIONS:  1.  Crestor 20 mg q.h.s.  2.  Mobic 7.5 mg daily.  3.  Metoprolol 50 mg tablet one half tablet in the morning, one half tablet      in the evening.  4.  Nexium 40 mg b.i.d.  5.  Prozac 20 mg daily.  6.  Enteric coated aspirin 325 mg daily.  7.  If he has pain at the catheterization site, Tylenol 325 mg 1-2 tablets      q.4-6h. p.r.n. pain.   DISCHARGE INSTRUCTIONS:  1.   Activity:  He is asked not to drive for the next two days.  He can      recommence driving Sunday, June 13, 2004.  2.  Diet:  Low-salt, low-cholesterol diet.  3.  He may shower.  4.  He is to call [phone] if he experiences pain or swelling at the      catheterization site.   FOLLOWUP:  Follow up at [facility] with Dr. Riley Nichols, Monday, June 21, 2004, at 3:15 in the afternoon.   BRIEF HISTORY:  [name] presents to [facility] Emergency Room with  complaint of increasing shortness of breath which has been ongoing for  several weeks.  He also complains of chest discomfort.  His symptoms he  described as intermittent tightness in the mid sternal area which are  localized there.  He rates his pain as a 6 on a scale of 1-10.  He is  currently rating the pain as a 5/1-10.  He states that his shortness of  breath has gradually gotten worse over the last few weeks.  [name] walks  for his health.  He says that he has to stop at about half a mile due to the  increased shortness of breath.  This is a fairly new onset for him.  He also  complains of decreased energy levels and also increasing diaphoresis with  any activity.  However, his chest tightness does not increase or change with  any activities.  It is not exacerbated by deep breathing.  Once again, he is  positive for shortness of breath, negative for nausea, posterior for  lightheadedness.  The patient will be admitted to a telemetry unit, will be  started on Plavix and heparin, continuing his beta blocker.  He will also be  started on IV nitroglycerin for pain control.  He will also have a low-dose  morphine IV.  He will also be started on ACE inhibitor for congestive heart  failure.  On examination, question left ventricular dysfunction.  This will  take the form of Lisinopril 10 mg daily.  A BNP will also be obtained.  In  addition, lower extremity Doppler studies with ABI's will be checked since the patient complains  of symptoms which may point toward claudication.  Because of the patient's past medical history including extensive stenting  of the culprit lesions in the left anterior descending, the patient will be  scheduled for left heart catheterization to be done on January 5 this  following day.   HOSPITAL COURSE:  Mr. Danielski was admitted through the emergency room at  [facility] with a several week experience with chest discomfort and  manifested as intermittent chest tightness as well as increasing dyspnea  which is becoming quite marked.  This is interfering with his activities of  daily living at the current time.  As stated before, the patient was placed  on IV heparin and also given Plavix orally 600 mg, continued on beta  blockers, started on IV nitroglycerin and scheduled for catheterization  study.  This study was done January 5 and demonstrated that there was no  ischemic source for his chest pain as dictated above.  A follow up D. dimer  was less than 0.22 effectively ruling out pulmonary embolus.  He also had  ankle brachial indexes which were greater than 1.0 bilaterally.  His BNP on  admission was less than 30.  His admission complete blood count on January  5:  White cells 7.4, hemoglobin 13.5, hematocrit 40.7, platelets 217,000.  Thyroid stimulating hormone 1.439.  As mentioned above, his troponin I  studies were 0.01 and less than 0.01 then less than 0.01.  His serum  electrolytes this admission:  Sodium 138, potassium 3.9, chloride 106, CO2  25, glucose 105, BUN 22, creatinine 1.1.  AS mentioned above, ejection  fraction was preserved.  This study at 69.  He also had no aortoiliac  occlusive disease.  Renal arteries were patent.  Mr. Hatchel goes home with  follow up and medications as dictated above.  Also medication will include  nitroglycerin 0.4 mg one tablet under the tongue every 5 minutes x3 doses as  needed for chest pain.       GM/MEDQ  D:  06/10/2004  T:   06/10/2004  Job:  [id number]   cc:   Arturo Morton. [name], M.D. [facility]   Areatha Keas, M.D.  [address]  [address]  [place]  [place] [address]  Fax: [phone]

## 2011-01-05 ENCOUNTER — Telehealth: Payer: Self-pay | Admitting: Cardiology

## 2011-01-05 NOTE — Telephone Encounter (Signed)
Per pt call, pt was called to jury duty. Pt had both knees replaced and a recent heart attack and cannot sit for longer than 45 mins. Pt would like MD to send note to pt to forward on to court to excuse pt from jury duty. Please return pt call with any questions.

## 2011-01-05 NOTE — Telephone Encounter (Signed)
Patient had an MI in 1996. He had knee surgery in January. Advised him to contact his orthopedic doctor to obtain a note for Jury Duty since his MI in 1996 would probably not keep him out of [employer].

## 2011-01-10 ENCOUNTER — Encounter: Payer: Self-pay | Admitting: Cardiology

## 2011-02-09 ENCOUNTER — Ambulatory Visit (INDEPENDENT_AMBULATORY_CARE_PROVIDER_SITE_OTHER): Payer: Medicare Other | Admitting: Cardiology

## 2011-02-09 ENCOUNTER — Encounter: Payer: Self-pay | Admitting: Cardiology

## 2011-02-09 DIAGNOSIS — I2510 Atherosclerotic heart disease of native coronary artery without angina pectoris: Secondary | ICD-10-CM

## 2011-02-09 DIAGNOSIS — I10 Essential (primary) hypertension: Secondary | ICD-10-CM

## 2011-02-09 DIAGNOSIS — M129 Arthropathy, unspecified: Secondary | ICD-10-CM

## 2011-02-09 DIAGNOSIS — E785 Hyperlipidemia, unspecified: Secondary | ICD-10-CM

## 2011-02-09 NOTE — Progress Notes (Signed)
HPI:  Doing well.  No chest pain.  Getting along without difficulty.  Denies any symptoms.  Primary is doing his lipid levels, and a monitoring.   Current Outpatient Prescriptions  Medication Sig Dispense Refill  . aspirin 81 MG tablet Take 81 mg by mouth daily.        Marland Kitchen atorvastatin (LIPITOR) 80 MG tablet Take 80 mg by mouth daily.        Marland Kitchen esomeprazole (NEXIUM) 40 MG capsule Take 40 mg by mouth as needed.        . meloxicam (MOBIC) 7.5 MG tablet Take 7.5 mg by mouth 2 (two) times daily.        . metoprolol (LOPRESSOR) 50 MG tablet Take 12.5 mg by mouth 2 (two) times daily.        . nitroGLYCERIN (NITROSTAT) 0.4 MG SL tablet Place 0.4 mg under the tongue every 5 (five) minutes as needed.          Allergies not on file  Past Medical History  Diagnosis Date  . CAD (coronary artery disease)   . Hyperlipidemia   . HTN (hypertension)   . MI (myocardial infarction)   . Depression   . Arthritis     Past Surgical History  Procedure Date  . Stent x2 in the lad  with intra-aortic balloon pump support 1996  . Stent to the lad 1997  . Knee surgery     Family History  Problem Relation Age of Onset  . Heart attack Mother     57s  . Heart attack Brother 72  . Cancer Sister     History   Social History  . Marital Status: Married    Spouse Name: N/A    Number of Children: 1  . Years of Education: N/A   Occupational History  . Retired    Social History Main Topics  . Smoking status: Former Smoker    Quit date: 06/06/1994  . Smokeless tobacco: Not on file  . Alcohol Use: No  . Drug Use: No  . Sexually Active: Not on file   Other Topics Concern  . Not on file   Social History Narrative  . No narrative on file    ROS: Please see the HPI.  All other systems reviewed and negative.  PHYSICAL EXAM:  BP 124/82  Pulse 56  Ht 5\' 9"  (1.753 m)  Wt 218 lb 12.8 oz (99.247 kg)  BMI 32.31 kg/m2  General: Well developed, well nourished, in no acute distress. Head:   Normocephalic and atraumatic. Neck: no JVD Lungs: Clear to auscultation and percussion. Heart: Normal S1 and S2.  No murmur, rubs or gallops.  Abdomen:  Normal bowel sounds; soft; non tender; no organomegaly Pulses: Pulses normal in all 4 extremities. Extremities: No clubbing or cyanosis. No edema. Neurologic: Alert and oriented x 3.  EKG:  NSR.  Nonspecific ST and T changes.   ASSESSMENT AND PLAN:f

## 2011-02-09 NOTE — Patient Instructions (Signed)
Your physician wants you to follow-up in:  12 months.  You will receive a reminder letter in the mail two months in advance. If you don't receive a letter, please call our office to schedule the follow-up appointment.   

## 2011-02-09 NOTE — Assessment & Plan Note (Signed)
On meloxicam.  Understands role of COX inhibition in CAD.  Reviewed but would not do well without it.

## 2011-02-09 NOTE — Assessment & Plan Note (Signed)
Followed by primary MD.  On lipitor.

## 2011-02-09 NOTE — Assessment & Plan Note (Signed)
No current symptoms

## 2011-02-09 NOTE — Assessment & Plan Note (Signed)
Controlled.  

## 2011-08-30 ENCOUNTER — Ambulatory Visit (INDEPENDENT_AMBULATORY_CARE_PROVIDER_SITE_OTHER): Payer: Medicare Other | Admitting: Gastroenterology

## 2011-08-30 ENCOUNTER — Encounter: Payer: Self-pay | Admitting: Gastroenterology

## 2011-08-30 DIAGNOSIS — K5900 Constipation, unspecified: Secondary | ICD-10-CM | POA: Diagnosis not present

## 2011-08-30 DIAGNOSIS — R198 Other specified symptoms and signs involving the digestive system and abdomen: Secondary | ICD-10-CM | POA: Diagnosis not present

## 2011-08-30 DIAGNOSIS — R194 Change in bowel habit: Secondary | ICD-10-CM

## 2011-08-30 MED ORDER — MOVIPREP 100 G PO SOLR: 1.0000 | ORAL | Status: DC

## 2011-08-30 NOTE — Progress Notes (Signed)
HPI: This is a    very pleasant 71 year old man whom I am meeting for the first time today.  he had colonoscopy July 2005 with Dr. Virginia Rochester, he found 2 small adenomas, diverticulosis, hemorrhoids.  No colonoscopy since then.  He tends to be constipated, especially the past 2-3 months.  Has a snsation to move his bowels but when he sits down to have a BM not much comes out. Has felt tight abdomen. He has gained small amount of weight.  No overt bleeding.  No dietary changes, no med changes recently.  Used to have BM every day, now he is about every 2-3 days.  Was started on a pain med for his low back recently,  Cyclobenzaprine   Review of systems: Pertinent positive and negative review of systems were noted in the above HPI section. Complete review of systems was performed and was otherwise normal.    Past Medical History  Diagnosis Date  . CAD (coronary artery disease)   . Hyperlipidemia   . HTN (hypertension)   . MI (myocardial infarction)   . Depression   . Arthritis     Past Surgical History  Procedure Date  . Stent x2 in the lad  with intra-aortic balloon pump support 1996  . Stent to the lad 1997  . Knee surgery     Current Outpatient Prescriptions  Medication Sig Dispense Refill  . aspirin 81 MG tablet Take 81 mg by mouth daily.        Marland Kitchen atorvastatin (LIPITOR) 80 MG tablet Take 80 mg by mouth daily.        . cyclobenzaprine (FLEXERIL) 10 MG tablet Take 10 mg by mouth 3 (three) times daily as needed.      Marland Kitchen esomeprazole (NEXIUM) 40 MG capsule Take 40 mg by mouth as needed.        . meloxicam (MOBIC) 7.5 MG tablet Take 7.5 mg by mouth 2 (two) times daily.        . metoprolol (LOPRESSOR) 50 MG tablet Take 12.5 mg by mouth 2 (two) times daily.        . nitroGLYCERIN (NITROSTAT) 0.4 MG SL tablet Place 0.4 mg under the tongue every 5 (five) minutes as needed.          Allergies as of 08/30/2011  . (No Known Allergies)    Family History  Problem Relation Age of Onset  .  Heart attack Mother     83s  . Heart attack Brother 72  . Cancer Sister     History   Social History  . Marital Status: Married    Spouse Name: N/A    Number of Children: 1  . Years of Education: N/A   Occupational History  . Retired    Social History Main Topics  . Smoking status: Former Smoker    Quit date: 06/06/1994  . Smokeless tobacco: Never Used  . Alcohol Use: No  . Drug Use: No  . Sexually Active: Not on file   Other Topics Concern  . Not on file   Social History Narrative  . No narrative on file       Physical Exam: BP 152/100  Pulse 60  Ht 5\' 9"  (1.753 m)  Wt 224 lb (101.606 kg)  BMI 33.08 kg/m2 Constitutional: generally well-appearing Psychiatric: alert and oriented x3 Eyes: extraocular movements intact Mouth: oral pharynx moist, no lesions Neck: supple no lymphadenopathy Cardiovascular: heart regular rate and rhythm Lungs: clear to auscultation bilaterally Abdomen: soft, nontender, nondistended,  no obvious ascites, no peritoneal signs, normal bowel sounds Extremities: no lower extremity edema bilaterally Skin: no lesions on visible extremities    Assessment and plan: 71 y.o. male with  change in bowel habits, relative constipation, personal history of adenomatous polyps  He will add fiber to his diet with Citrucel. We will proceed with colonoscopy at his soonest convenience.

## 2011-08-30 NOTE — Patient Instructions (Signed)
You will be set up for a colonoscopy for change in bowels, history of adenomatous polyps. Please start taking citrucel (orange flavored) powder fiber supplement.  This may cause some bloating at first but that usually goes away. Begin with a small spoonful and work your way up to a large, heaping spoonful daily over a week. Drink extra 20oz water once daily.

## 2011-09-16 ENCOUNTER — Encounter: Payer: Self-pay | Admitting: Gastroenterology

## 2011-09-16 ENCOUNTER — Ambulatory Visit (AMBULATORY_SURGERY_CENTER): Payer: Medicare Other | Admitting: Gastroenterology

## 2011-09-16 VITALS — BP 123/78 | HR 71 | Temp 96.7°F | Resp 16 | Ht 69.0 in | Wt 224.0 lb

## 2011-09-16 DIAGNOSIS — K5730 Diverticulosis of large intestine without perforation or abscess without bleeding: Secondary | ICD-10-CM

## 2011-09-16 DIAGNOSIS — Z860100 Personal history of colon polyps, unspecified: Secondary | ICD-10-CM

## 2011-09-16 DIAGNOSIS — R198 Other specified symptoms and signs involving the digestive system and abdomen: Secondary | ICD-10-CM | POA: Diagnosis not present

## 2011-09-16 MED ORDER — SODIUM CHLORIDE 0.9 % IV SOLN: 500.0000 mL | INTRAVENOUS | Status: DC

## 2011-09-16 NOTE — Patient Instructions (Signed)

## 2011-09-16 NOTE — Progress Notes (Signed)
Patient did not experience any of the following events: a burn prior to discharge; a fall within the facility; wrong site/side/patient/procedure/implant event; or a hospital transfer or hospital admission upon discharge from the facility. (G8907) Patient did not have preoperative order for IV antibiotic SSI prophylaxis. (G8918)  

## 2011-09-16 NOTE — Op Note (Signed)
[facility] [address] [place], [place]  [address]  COLONOSCOPY PROCEDURE REPORT  PATIENT:  Timothy, Nichols  MR#:  [id number] BIRTHDATE:  1941-04-30, 70 yrs. old  GENDER:  male ENDOSCOPIST:  Rachael Fee, MD REF. BY:  Vernie Ammons, M.D. PROCEDURE DATE:  09/16/2011 PROCEDURE:  Colonoscopy [address] ASA CLASS:  Class II INDICATIONS:  colonoscopy July 2005 with Dr. Virginia Rochester, he found 2 small adenomas, diverticulosis, hemorrhoids; now with change in bowel habits MEDICATIONS:   Fentanyl 37.5 mcg IV, These medications were titrated to patient response per physician's verbal order, Versed 5 mg IV  DESCRIPTION OF PROCEDURE:   After the risks benefits and alternatives of the procedure were thoroughly explained, informed consent was obtained.  Digital rectal exam was performed and revealed no rectal masses.   The LB 180AL [id number] endoscope was introduced through the anus and advanced to the cecum, which was identified by both the appendix and ileocecal valve, without limitations.  The quality of the prep was good..  The instrument was then slowly withdrawn as the colon was fully examined. <<PROCEDUREIMAGES>> FINDINGS:  Mild diverticulosis was found in the sigmoid to descending colon segments (see image5).  This was otherwise a normal examination of the colon (see image6, image4, and image3). Retroflexed views in the rectum revealed no abnormalities. COMPLICATIONS:  None  ENDOSCOPIC IMPRESSION: 1) Mild diverticulosis in the sigmoid to descending colon segments 2) Otherwise normal examination; no polyps or cancers  RECOMMENDATIONS: 1) Given your personal history of adenomatous (pre-cancerous) polyps, you will need a repeat colonoscopy in 5 years.  REPEAT EXAM:  5 years  ______________________________ Rachael Fee, MD  n. eSIGNED:   Rachael Fee at 09/16/2011 10:08 AM  Heriberto [place], [id number]

## 2011-09-19 ENCOUNTER — Telehealth: Payer: Self-pay | Admitting: *Deleted

## 2011-09-19 NOTE — Telephone Encounter (Signed)
  Follow up Call-  Call back number 09/16/2011  Post procedure Call Back phone  # [phone]  Permission to leave phone message Yes     Left message

## 2011-09-24 ENCOUNTER — Other Ambulatory Visit: Payer: Self-pay | Admitting: Cardiology

## 2011-11-04 DIAGNOSIS — E1129 Type 2 diabetes mellitus with other diabetic kidney complication: Secondary | ICD-10-CM | POA: Diagnosis not present

## 2011-11-04 DIAGNOSIS — J312 Chronic pharyngitis: Secondary | ICD-10-CM | POA: Diagnosis not present

## 2011-11-04 DIAGNOSIS — I10 Essential (primary) hypertension: Secondary | ICD-10-CM | POA: Diagnosis not present

## 2011-11-04 DIAGNOSIS — N182 Chronic kidney disease, stage 2 (mild): Secondary | ICD-10-CM | POA: Diagnosis not present

## 2011-11-07 DIAGNOSIS — K219 Gastro-esophageal reflux disease without esophagitis: Secondary | ICD-10-CM | POA: Diagnosis not present

## 2011-11-07 DIAGNOSIS — J3800 Paralysis of vocal cords and larynx, unspecified: Secondary | ICD-10-CM | POA: Diagnosis not present

## 2011-11-08 ENCOUNTER — Other Ambulatory Visit: Payer: Self-pay | Admitting: Otolaryngology

## 2011-11-08 DIAGNOSIS — J3800 Paralysis of vocal cords and larynx, unspecified: Secondary | ICD-10-CM

## 2011-11-09 ENCOUNTER — Ambulatory Visit
Admission: RE | Admit: 2011-11-09 | Discharge: 2011-11-09 | Disposition: A | Discharge status: Home / Self Care | Payer: Medicare Other | Source: Ambulatory Visit | Attending: Otolaryngology | Admitting: Otolaryngology

## 2011-11-09 DIAGNOSIS — I6529 Occlusion and stenosis of unspecified carotid artery: Secondary | ICD-10-CM | POA: Diagnosis not present

## 2011-11-09 DIAGNOSIS — M47812 Spondylosis without myelopathy or radiculopathy, cervical region: Secondary | ICD-10-CM | POA: Diagnosis not present

## 2011-11-09 DIAGNOSIS — J3800 Paralysis of vocal cords and larynx, unspecified: Secondary | ICD-10-CM

## 2011-11-09 MED ORDER — IOHEXOL 300 MG/ML  SOLN
75.0000 mL | Freq: Once | INTRAMUSCULAR | Status: AC | PRN
  Administered 2011-11-09: 75 mL via INTRAVENOUS

## 2011-12-13 ENCOUNTER — Ambulatory Visit (INDEPENDENT_AMBULATORY_CARE_PROVIDER_SITE_OTHER): Payer: Medicare Other | Admitting: Gastroenterology

## 2011-12-13 ENCOUNTER — Encounter: Payer: Self-pay | Admitting: Gastroenterology

## 2011-12-13 VITALS — BP 138/90 | HR 64 | Ht 69.0 in | Wt 218.6 lb

## 2011-12-13 DIAGNOSIS — R490 Dysphonia: Secondary | ICD-10-CM

## 2011-12-13 DIAGNOSIS — J029 Acute pharyngitis, unspecified: Secondary | ICD-10-CM

## 2011-12-13 NOTE — Patient Instructions (Addendum)
You will be set up for an upper endoscopy for possible GERD damage We will get records from [facility] to review the "abnormality on vocal cord." Continue nexium twice daily for now. Take pepcid or zantac, one pill every night at bedtime.

## 2011-12-13 NOTE — Progress Notes (Signed)
Review of pertinent gastrointestinal problems: 1. adenomatous colon polyps, 2005 by colonoscopy, Dr. Virginia Rochester.  Repeat colonoscopy April 2013 showed diverticulosis but was otherwise normal. Recall colonoscopy at 5 year interval   HPI: This is a    very pleasant 71 year old man whom I saw 2-3 months ago for history of colon polyps. He is here today for a new problem.   Has has been having problems with his throat, rough feeling.  He went to pcp, increased nexium to 40mg  bid.  He thinks he is 80% better. Went to ENT, larngoscopy found something on vocal cords, went to get an xray at [facility] imaging.  ENT [facility], does not recall the name of MD and we have no record.  He does not have a follow up appt with ENT currently.  Has globus sensation.    Was told he had peptic ulcer years ago.  He does not have dyspepsia or pyrosis regularly.  He feels his chronic intermittent sore throat irritiation has been acid related.  2-3 caffinated sodas a day.   Non-smoker.  No etoh.  Very hoarse.   Review of systems: Pertinent positive and negative review of systems were noted in the above HPI section. Complete review of systems was performed and was otherwise normal.    Past Medical History  Diagnosis Date  . CAD (coronary artery disease)   . Hyperlipidemia   . HTN (hypertension)   . MI (myocardial infarction)   . Depression   . Arthritis     Past Surgical History  Procedure Date  . Stent x2 in the lad  with intra-aortic balloon pump support 1996  . Stent to the lad 1997  . Knee surgery   . Colonoscopy     hx of polyps  . Tonsillectomy   . Brain tumor excision 1954    benign tumor in back of head, done at  [facility]    Current Outpatient Prescriptions  Medication Sig Dispense Refill  . aspirin 81 MG tablet Take 81 mg by mouth daily.        Marland Kitchen atorvastatin (LIPITOR) 80 MG tablet Take 80 mg by mouth daily.        . cyclobenzaprine (FLEXERIL) 10 MG tablet Take 10 mg by mouth 3 (three)  times daily as needed.      Marland Kitchen esomeprazole (NEXIUM) 40 MG capsule Take 40 mg by mouth 2 (two) times daily.       . meloxicam (MOBIC) 7.5 MG tablet Take 7.5 mg by mouth 2 (two) times daily.        . metoprolol (LOPRESSOR) 50 MG tablet TAKE 1/4 OF THE TABLET TWO TIMES A DAY  15 tablet  6  . nitroGLYCERIN (NITROSTAT) 0.4 MG SL tablet Place 0.4 mg under the tongue every 5 (five) minutes as needed.          Allergies as of 12/13/2011  . (No Known Allergies)    Family History  Problem Relation Age of Onset  . Heart attack Mother     31s  . Heart attack Brother 72  . Cancer Sister     History   Social History  . Marital Status: Married    Spouse Name: N/A    Number of Children: 1  . Years of Education: N/A   Occupational History  . Retired    Social History Main Topics  . Smoking status: Former Smoker    Quit date: 06/06/1994  . Smokeless tobacco: Never Used  . Alcohol Use: No  .  Drug Use: No  . Sexually Active: Yes   Other Topics Concern  . Not on file   Social History Narrative  . No narrative on file       Physical Exam: BP 138/90  Pulse 64  Ht 5\' 9"  (1.753 m)  Wt 218 lb 9.6 oz (99.156 kg)  BMI 32.28 kg/m2 Constitutional: generally well-appearing Psychiatric: alert and oriented x3 Eyes: extraocular movements intact Mouth: oral pharynx moist, no lesions Neck: supple no lymphadenopathy Cardiovascular: heart regular rate and rhythm Lungs: clear to auscultation bilaterally Abdomen: soft, nontender, nondistended, no obvious ascites, no peritoneal signs, normal bowel sounds Extremities: no lower extremity edema bilaterally Skin: no lesions on visible extremities    Assessment and plan: 71 y.o. male with  hoarseness, burning in throat, question relation to GERD  His throat symptoms are perhaps related to GERD. It is however unusual for GERD caused such significant throat, voice symptoms without any more typical esophageal symptoms like pyrosis or even  nausea. He had something noted on his vocal cord recently by ENT and we will work to get those records sent over here for review. I am going to proceed with EGD at his soonest convenience. He is already taking proton pump inhibitor twice daily and he will add H2 blocker at bedtime for maximum medical acid control.

## 2011-12-14 ENCOUNTER — Ambulatory Visit (INDEPENDENT_AMBULATORY_CARE_PROVIDER_SITE_OTHER): Payer: Medicare Other | Admitting: Physician Assistant

## 2011-12-14 ENCOUNTER — Encounter: Payer: Self-pay | Admitting: Physician Assistant

## 2011-12-14 VITALS — BP 137/93 | HR 81 | Ht 69.0 in | Wt 217.0 lb

## 2011-12-14 DIAGNOSIS — I2510 Atherosclerotic heart disease of native coronary artery without angina pectoris: Secondary | ICD-10-CM

## 2011-12-14 DIAGNOSIS — R42 Dizziness and giddiness: Secondary | ICD-10-CM | POA: Insufficient documentation

## 2011-12-14 NOTE — Progress Notes (Signed)
HPI:  This is a 70 year old male patient of Dr. Nancy Marus who comes in today for evaluation of dizziness. He says for the past several weeks when he stands up he tends to lean to the left and become dizzy for 10-15 seconds. If he holds onto something he is fine and can go on. He recently saw ENT for vocal cord problem but did not discuss this with him. He denies any syncope. He denies chest pain, palpitations, dyspnea, or other cardiac complaints. He has history of coronary artery disease status post stenting of the LAD x2.  No Known Allergies  Current Outpatient Prescriptions on File Prior to Visit: aspirin 81 MG tablet, Take 81 mg by mouth daily.  , Disp: , Rfl:  atorvastatin (LIPITOR) 80 MG tablet, Take 80 mg by mouth daily.  , Disp: , Rfl:  cyclobenzaprine (FLEXERIL) 10 MG tablet, Take 10 mg by mouth 3 (three) times daily as needed., Disp: , Rfl:  esomeprazole (NEXIUM) 40 MG capsule, Take 40 mg by mouth 2 (two) times daily. , Disp: , Rfl:  meloxicam (MOBIC) 7.5 MG tablet, Take 7.5 mg by mouth 2 (two) times daily.  , Disp: , Rfl:  metoprolol (LOPRESSOR) 50 MG tablet, TAKE 1/4 OF THE TABLET TWO TIMES A DAY, Disp: 15 tablet, Rfl: 6 nitroGLYCERIN (NITROSTAT) 0.4 MG SL tablet, Place 0.4 mg under the tongue every 5 (five) minutes as needed.  , Disp: , Rfl:     Past Medical History:   CAD (coronary artery disease)                                Hyperlipidemia                                               HTN (hypertension)                                           MI (myocardial infarction)                                   Depression                                                   Arthritis                                                   Past Surgical History:   stent x2 in the LAD  with intra-aortic balloon* 1996         stent to the LAD                                1997         KNEE SURGERY  COLONOSCOPY                                                     Comment:hx of polyps   TONSILLECTOMY                                                BRAIN TUMOR EXCISION                            1954           Comment:benign tumor in back of head, done at  [facility]  Review of patient's family history indicates:   Heart attack                   Mother                     Comment: 49s   Heart attack                   Brother                  Cancer                         Sister                   Social History   Marital Status: Married             Spouse Name:                      Years of Education:                 Number of children: 1           Occupational History Occupation          Associate Professor            Comment              Retired                                   Social History Main Topics   Smoking Status: Former Smoker                   Packs/Day:       Years:           Quit date: 06/06/1994   Smokeless Status: Never Used                       Alcohol Use: No             Drug Use: No             Sexual Activity: Yes                Other Topics            Concern   None on file  Social History Narrative   None on file    ROS:See history of  present illness otherwise negative   PHYSICAL EXAM: Well-nournished, in no acute distress. Neck: No JVD, HJR, Bruit, or thyroid enlargement  Lungs: No tachypnea, clear without wheezing, rales, or rhonchi  Cardiovascular: RRR, PMI not displaced, heart sounds normal, no murmurs, gallops, bruit, thrill, or heave.  Abdomen: BS normal. Soft without organomegaly, masses, lesions or tenderness.  Extremities: without cyanosis, clubbing or edema. Good distal pulses bilateral  SKin: Warm, no lesions or rashes   Musculoskeletal: No deformities  Neuro: no focal signs  BP 137/93  Pulse 81  Ht 5\' 9"  (1.753 m)  Wt 217 lb (98.431 kg)  BMI 32.05 kg/m2   ZOX:WRUEAV sinus rhythm with nonspecific ST-T wave changes no acute change

## 2011-12-14 NOTE — Patient Instructions (Addendum)
Your physician recommends that you schedule a follow-up appointment in: September or October with Dr Riley Kill

## 2011-12-14 NOTE — Assessment & Plan Note (Signed)
Patient complains of several week history of swelling to the left and becoming dizzy when he stands up. He is not orthostatic in the office today. He is on very low dose metoprolol. I do not think this is cardiac related. I have asked him to discuss this with ENT on his followup this week. He may need to see a neurologist. He already has a scheduled followup appointment with Dr. Riley Kill.

## 2011-12-15 ENCOUNTER — Other Ambulatory Visit: Payer: Self-pay | Admitting: Internal Medicine

## 2011-12-15 ENCOUNTER — Ambulatory Visit
Admission: RE | Admit: 2011-12-15 | Discharge: 2011-12-15 | Disposition: A | Discharge status: Home / Self Care | Payer: Medicare Other | Source: Ambulatory Visit | Attending: Internal Medicine | Admitting: Internal Medicine

## 2011-12-15 DIAGNOSIS — G9389 Other specified disorders of brain: Secondary | ICD-10-CM | POA: Diagnosis not present

## 2011-12-15 DIAGNOSIS — R42 Dizziness and giddiness: Secondary | ICD-10-CM

## 2011-12-15 DIAGNOSIS — G319 Degenerative disease of nervous system, unspecified: Secondary | ICD-10-CM | POA: Diagnosis not present

## 2011-12-16 ENCOUNTER — Other Ambulatory Visit: Payer: Self-pay | Admitting: Internal Medicine

## 2011-12-16 DIAGNOSIS — R42 Dizziness and giddiness: Secondary | ICD-10-CM

## 2011-12-26 ENCOUNTER — Telehealth: Payer: Self-pay | Admitting: Gastroenterology

## 2011-12-26 NOTE — Telephone Encounter (Signed)
Reviewed outside records;  Ear nose and throat evaluation June 2013 suggested that patient had a paralyzed vocal cord on the left, unclear cause. A CT scan was performed with "negative CT scan results". He was recommended to have a repeat visit At his ear nose and throat doctors office in early to mid July. He never went through with that.

## 2011-12-27 ENCOUNTER — Encounter: Payer: Self-pay | Admitting: Gastroenterology

## 2011-12-27 ENCOUNTER — Ambulatory Visit (AMBULATORY_SURGERY_CENTER): Payer: Medicare Other | Admitting: Gastroenterology

## 2011-12-27 VITALS — BP 155/99 | HR 72 | Temp 97.6°F | Resp 16 | Ht 69.0 in | Wt 218.0 lb

## 2011-12-27 DIAGNOSIS — K209 Esophagitis, unspecified without bleeding: Secondary | ICD-10-CM

## 2011-12-27 DIAGNOSIS — J029 Acute pharyngitis, unspecified: Secondary | ICD-10-CM | POA: Diagnosis not present

## 2011-12-27 DIAGNOSIS — R933 Abnormal findings on diagnostic imaging of other parts of digestive tract: Secondary | ICD-10-CM

## 2011-12-27 DIAGNOSIS — R490 Dysphonia: Secondary | ICD-10-CM

## 2011-12-27 DIAGNOSIS — K2970 Gastritis, unspecified, without bleeding: Secondary | ICD-10-CM

## 2011-12-27 DIAGNOSIS — K2990 Gastroduodenitis, unspecified, without bleeding: Secondary | ICD-10-CM

## 2011-12-27 MED ORDER — SODIUM CHLORIDE 0.9 % IV SOLN: 500.0000 mL | INTRAVENOUS | Status: DC

## 2011-12-27 NOTE — Progress Notes (Signed)
Patient did not experience any of the following events: a burn prior to discharge; a fall within the facility; wrong site/side/patient/procedure/implant event; or a hospital transfer or hospital admission upon discharge from the facility. (G8907) Patient did not have preoperative order for IV antibiotic SSI prophylaxis. (G8918)  

## 2011-12-27 NOTE — Patient Instructions (Addendum)
YOU HAD AN ENDOSCOPIC PROCEDURE TODAY AT THE [facility]: Refer to the procedure report that was given to you for any specific questions about what was found during the examination.  If the procedure report does not answer your questions, please call your gastroenterologist to clarify.  If you requested that your care partner not be given the details of your procedure findings, then the procedure report has been included in a sealed envelope for you to review at your convenience later.  YOU SHOULD EXPECT: Some feelings of bloating in the abdomen. Passage of more gas than usual.  Walking can help get rid of the air that was put into your GI tract during the procedure and reduce the bloating. If you had a lower endoscopy (such as a colonoscopy or flexible sigmoidoscopy) you may notice spotting of blood in your stool or on the toilet paper. If you underwent a bowel prep for your procedure, then you may not have a normal bowel movement for a few days.  DIET: Your first meal following the procedure should be a light meal and then it is ok to progress to your normal diet.  A half-sandwich or bowl of soup is an example of a good first meal.  Heavy or fried foods are harder to digest and may make you feel nauseous or bloated.  Likewise meals heavy in dairy and vegetables can cause extra gas to form and this can also increase the bloating.  Drink plenty of fluids but you should avoid alcoholic beverages for 24 hours.  ACTIVITY: Your care partner should take you home directly after the procedure.  You should plan to take it easy, moving slowly for the rest of the day.  You can resume normal activity the day after the procedure however you should NOT DRIVE or use heavy machinery for 24 hours (because of the sedation medicines used during the test).    SYMPTOMS TO REPORT IMMEDIATELY: A gastroenterologist can be reached at any hour.  During normal business hours, 8:30 AM to 5:00 PM Monday through Friday,  call [phone].  After hours and on weekends, please call the GI answering service at [phone] who will take a message and have the physician on call contact you.    Following upper endoscopy (EGD)  Vomiting of blood or coffee ground material  New chest pain or pain under the shoulder blades  Painful or persistently difficult swallowing  New shortness of breath  Fever of 100F or higher  Black, tarry-looking stools  FOLLOW UP: If any biopsies were taken you will be contacted by phone or by letter within the next 1-3 weeks.  Call your gastroenterologist if you have not heard about the biopsies in 3 weeks.  Our staff will call the home number listed on your records the next business day following your procedure to check on you and address any questions or concerns that you may have at that time regarding the information given to you following your procedure. This is a courtesy call and so if there is no answer at the home number and we have not heard from you through the emergency physician on call, we will assume that you have returned to your regular daily activities without incident.  SIGNATURES/CONFIDENTIALITY: You and/or your care partner have signed paperwork which will be entered into your electronic medical record.  These signatures attest to the fact that that the information above on your After Visit Summary has been reviewed and is understood.  Full   responsibility of the confidentiality of this discharge information lies with you and/or your care-partner.   Resume medications. Information on gastritis given with discharge instructions.

## 2011-12-27 NOTE — Op Note (Signed)
[facility] [address] [place], [place]  [address]  ENDOSCOPY PROCEDURE REPORT  PATIENT:  [name], [name]  MR#:  [id number] BIRTHDATE:  1940-08-01, 70 yrs. old  GENDER:  male ENDOSCOPIST:  Rachael Fee, MD PROCEDURE DATE:  12/27/2011 PROCEDURE:  EGD with biopsy, 432[address] ASA CLASS:  Class II INDICATIONS:  sore throat, dyspepsia MEDICATIONS:   Fentanyl 50 mcg IV, These medications were titrated to patient response per physician's verbal order, Versed 6 mg IV TOPICAL ANESTHETIC:  Cetacaine Spray DESCRIPTION OF PROCEDURE:   After the risks and benefits of the procedure were explained, informed consent was obtained.  The LB GIF-H180 [id number] endoscope was introduced through the mouth and advanced to the second portion of the duodenum.  The instrument was slowly withdrawn as the mucosa was fully examined.  <<PROCEDUREIMAGES>> There was moderate, non-specific gastritis. Biopsies taken and sent to pathology (jar 1) (see image2).  irregular Z-line. There was a non-nodular, slightly irregular z-line. Biopsies taken and sent to pathology (jar 2) (see image5).  Otherwise the examination was normal (see image4 and image1).    Retroflexed views revealed no abnormalities.    The scope was then withdrawn from the patient and the procedure completed. COMPLICATIONS:  None  ENDOSCOPIC IMPRESSION: 1) Moderate gastritis, biopsied to check for H. pylori 2) Irregular Z-line; biopsied to check for Barrett's change 3) Otherwise normal examination; no clear acid reflux damage  RECOMMENDATIONS: 1) Await biopsy results.  For now stay on maximum acid suppression with PPI twice daily (20-30 min prior to breakfast and dinner meals) and H2 blocker at bedtime 2) You should return to see your ENT physician for the vocal cord paralysis as well as the positional dizziness (as recommended by cardiology as well).  ______________________________ Rachael Fee, MD  cc: Vernie Ammons,  MD  n. eSIGNED:   Rachael Fee at 12/27/2011 09:08 AM  Heriberto Antigua, [id number]

## 2011-12-28 ENCOUNTER — Telehealth: Payer: Self-pay | Admitting: *Deleted

## 2011-12-28 ENCOUNTER — Ambulatory Visit: Payer: Medicare Other | Admitting: Cardiology

## 2011-12-28 NOTE — Telephone Encounter (Signed)
Telephone number provided has been disconnected. Attempts x 2.

## 2011-12-29 DIAGNOSIS — R269 Unspecified abnormalities of gait and mobility: Secondary | ICD-10-CM | POA: Diagnosis not present

## 2011-12-29 DIAGNOSIS — E785 Hyperlipidemia, unspecified: Secondary | ICD-10-CM | POA: Diagnosis not present

## 2011-12-29 DIAGNOSIS — I10 Essential (primary) hypertension: Secondary | ICD-10-CM | POA: Diagnosis not present

## 2011-12-29 DIAGNOSIS — D496 Neoplasm of unspecified behavior of brain: Secondary | ICD-10-CM | POA: Diagnosis not present

## 2012-01-03 ENCOUNTER — Encounter: Payer: Self-pay | Admitting: Gastroenterology

## 2012-01-03 ENCOUNTER — Other Ambulatory Visit: Payer: Self-pay | Admitting: Neurology

## 2012-01-03 DIAGNOSIS — E785 Hyperlipidemia, unspecified: Secondary | ICD-10-CM

## 2012-01-03 DIAGNOSIS — D496 Neoplasm of unspecified behavior of brain: Secondary | ICD-10-CM

## 2012-01-03 DIAGNOSIS — I10 Essential (primary) hypertension: Secondary | ICD-10-CM

## 2012-01-03 DIAGNOSIS — R269 Unspecified abnormalities of gait and mobility: Secondary | ICD-10-CM

## 2012-01-04 ENCOUNTER — Other Ambulatory Visit: Payer: Self-pay | Admitting: Neurology

## 2012-01-04 DIAGNOSIS — D496 Neoplasm of unspecified behavior of brain: Secondary | ICD-10-CM

## 2012-01-04 DIAGNOSIS — R269 Unspecified abnormalities of gait and mobility: Secondary | ICD-10-CM

## 2012-01-04 DIAGNOSIS — I10 Essential (primary) hypertension: Secondary | ICD-10-CM | POA: Diagnosis not present

## 2012-01-04 DIAGNOSIS — E785 Hyperlipidemia, unspecified: Secondary | ICD-10-CM | POA: Diagnosis not present

## 2012-01-05 ENCOUNTER — Ambulatory Visit
Admission: RE | Admit: 2012-01-05 | Discharge: 2012-01-05 | Disposition: A | Discharge status: Home / Self Care | Payer: Medicare Other | Source: Ambulatory Visit | Attending: Neurology | Admitting: Neurology

## 2012-01-05 DIAGNOSIS — I6529 Occlusion and stenosis of unspecified carotid artery: Secondary | ICD-10-CM | POA: Diagnosis not present

## 2012-01-05 DIAGNOSIS — E785 Hyperlipidemia, unspecified: Secondary | ICD-10-CM

## 2012-01-05 DIAGNOSIS — I10 Essential (primary) hypertension: Secondary | ICD-10-CM

## 2012-01-05 DIAGNOSIS — R269 Unspecified abnormalities of gait and mobility: Secondary | ICD-10-CM

## 2012-01-05 DIAGNOSIS — D496 Neoplasm of unspecified behavior of brain: Secondary | ICD-10-CM

## 2012-01-05 MED ORDER — IOHEXOL 350 MG/ML SOLN
100.0000 mL | Freq: Once | INTRAVENOUS | Status: AC | PRN
  Administered 2012-01-05: 100 mL via INTRAVENOUS

## 2012-01-11 ENCOUNTER — Other Ambulatory Visit: Payer: Medicare Other | Admitting: Gastroenterology

## 2012-01-11 DIAGNOSIS — K219 Gastro-esophageal reflux disease without esophagitis: Secondary | ICD-10-CM | POA: Diagnosis not present

## 2012-01-11 DIAGNOSIS — R42 Dizziness and giddiness: Secondary | ICD-10-CM | POA: Diagnosis not present

## 2012-01-11 DIAGNOSIS — J3800 Paralysis of vocal cords and larynx, unspecified: Secondary | ICD-10-CM | POA: Diagnosis not present

## 2012-01-11 DIAGNOSIS — R070 Pain in throat: Secondary | ICD-10-CM | POA: Diagnosis not present

## 2012-01-12 ENCOUNTER — Other Ambulatory Visit: Payer: Self-pay | Admitting: Otolaryngology

## 2012-01-12 DIAGNOSIS — R070 Pain in throat: Secondary | ICD-10-CM

## 2012-01-17 DIAGNOSIS — R42 Dizziness and giddiness: Secondary | ICD-10-CM | POA: Diagnosis not present

## 2012-01-17 DIAGNOSIS — I10 Essential (primary) hypertension: Secondary | ICD-10-CM | POA: Diagnosis not present

## 2012-01-17 DIAGNOSIS — E1129 Type 2 diabetes mellitus with other diabetic kidney complication: Secondary | ICD-10-CM | POA: Diagnosis not present

## 2012-01-17 DIAGNOSIS — Z125 Encounter for screening for malignant neoplasm of prostate: Secondary | ICD-10-CM | POA: Diagnosis not present

## 2012-01-17 DIAGNOSIS — E785 Hyperlipidemia, unspecified: Secondary | ICD-10-CM | POA: Diagnosis not present

## 2012-01-19 ENCOUNTER — Ambulatory Visit
Admission: RE | Admit: 2012-01-19 | Discharge: 2012-01-19 | Disposition: A | Discharge status: Home / Self Care | Payer: Medicare Other | Source: Ambulatory Visit | Attending: Otolaryngology | Admitting: Otolaryngology

## 2012-01-19 DIAGNOSIS — R1310 Dysphagia, unspecified: Secondary | ICD-10-CM | POA: Diagnosis not present

## 2012-01-19 DIAGNOSIS — R070 Pain in throat: Secondary | ICD-10-CM | POA: Diagnosis not present

## 2012-02-03 DIAGNOSIS — N182 Chronic kidney disease, stage 2 (mild): Secondary | ICD-10-CM | POA: Diagnosis not present

## 2012-02-03 DIAGNOSIS — E785 Hyperlipidemia, unspecified: Secondary | ICD-10-CM | POA: Diagnosis not present

## 2012-02-03 DIAGNOSIS — E1129 Type 2 diabetes mellitus with other diabetic kidney complication: Secondary | ICD-10-CM | POA: Diagnosis not present

## 2012-02-03 DIAGNOSIS — I10 Essential (primary) hypertension: Secondary | ICD-10-CM | POA: Diagnosis not present

## 2012-02-03 DIAGNOSIS — I2510 Atherosclerotic heart disease of native coronary artery without angina pectoris: Secondary | ICD-10-CM | POA: Diagnosis not present

## 2012-02-09 DIAGNOSIS — Z23 Encounter for immunization: Secondary | ICD-10-CM | POA: Diagnosis not present

## 2012-03-01 DIAGNOSIS — E785 Hyperlipidemia, unspecified: Secondary | ICD-10-CM | POA: Diagnosis not present

## 2012-03-01 DIAGNOSIS — D496 Neoplasm of unspecified behavior of brain: Secondary | ICD-10-CM | POA: Diagnosis not present

## 2012-03-01 DIAGNOSIS — I10 Essential (primary) hypertension: Secondary | ICD-10-CM | POA: Diagnosis not present

## 2012-03-01 DIAGNOSIS — R269 Unspecified abnormalities of gait and mobility: Secondary | ICD-10-CM | POA: Diagnosis not present

## 2012-03-12 ENCOUNTER — Ambulatory Visit: Payer: Medicare Other | Admitting: Cardiology

## 2012-06-20 DIAGNOSIS — H01009 Unspecified blepharitis unspecified eye, unspecified eyelid: Secondary | ICD-10-CM | POA: Diagnosis not present

## 2012-06-20 DIAGNOSIS — H04129 Dry eye syndrome of unspecified lacrimal gland: Secondary | ICD-10-CM | POA: Diagnosis not present

## 2012-06-20 DIAGNOSIS — H31019 Macula scars of posterior pole (postinflammatory) (post-traumatic), unspecified eye: Secondary | ICD-10-CM | POA: Diagnosis not present

## 2012-06-20 DIAGNOSIS — Z961 Presence of intraocular lens: Secondary | ICD-10-CM | POA: Diagnosis not present

## 2012-08-06 DIAGNOSIS — I10 Essential (primary) hypertension: Secondary | ICD-10-CM | POA: Diagnosis not present

## 2012-08-06 DIAGNOSIS — E785 Hyperlipidemia, unspecified: Secondary | ICD-10-CM | POA: Diagnosis not present

## 2012-08-06 DIAGNOSIS — E1129 Type 2 diabetes mellitus with other diabetic kidney complication: Secondary | ICD-10-CM | POA: Diagnosis not present

## 2012-08-13 DIAGNOSIS — E785 Hyperlipidemia, unspecified: Secondary | ICD-10-CM | POA: Diagnosis not present

## 2012-08-13 DIAGNOSIS — N182 Chronic kidney disease, stage 2 (mild): Secondary | ICD-10-CM | POA: Diagnosis not present

## 2012-08-13 DIAGNOSIS — I2510 Atherosclerotic heart disease of native coronary artery without angina pectoris: Secondary | ICD-10-CM | POA: Diagnosis not present

## 2012-08-13 DIAGNOSIS — E1129 Type 2 diabetes mellitus with other diabetic kidney complication: Secondary | ICD-10-CM | POA: Diagnosis not present

## 2012-09-13 DIAGNOSIS — Z96659 Presence of unspecified artificial knee joint: Secondary | ICD-10-CM | POA: Diagnosis not present

## 2012-09-13 DIAGNOSIS — M25569 Pain in unspecified knee: Secondary | ICD-10-CM | POA: Insufficient documentation

## 2012-11-26 ENCOUNTER — Emergency Department (HOSPITAL_COMMUNITY)
Admission: EM | Admit: 2012-11-26 | Discharge: 2012-11-26 | Disposition: A | Discharge status: Home / Self Care | Payer: Medicare Other | Attending: Emergency Medicine | Admitting: Emergency Medicine

## 2012-11-26 DIAGNOSIS — I2510 Atherosclerotic heart disease of native coronary artery without angina pectoris: Secondary | ICD-10-CM | POA: Insufficient documentation

## 2012-11-26 DIAGNOSIS — I10 Essential (primary) hypertension: Secondary | ICD-10-CM | POA: Diagnosis not present

## 2012-11-26 DIAGNOSIS — Y9389 Activity, other specified: Secondary | ICD-10-CM | POA: Insufficient documentation

## 2012-11-26 DIAGNOSIS — Y929 Unspecified place or not applicable: Secondary | ICD-10-CM | POA: Insufficient documentation

## 2012-11-26 DIAGNOSIS — Z79899 Other long term (current) drug therapy: Secondary | ICD-10-CM | POA: Diagnosis not present

## 2012-11-26 DIAGNOSIS — Z87891 Personal history of nicotine dependence: Secondary | ICD-10-CM | POA: Diagnosis not present

## 2012-11-26 DIAGNOSIS — I252 Old myocardial infarction: Secondary | ICD-10-CM | POA: Diagnosis not present

## 2012-11-26 DIAGNOSIS — E785 Hyperlipidemia, unspecified: Secondary | ICD-10-CM | POA: Diagnosis not present

## 2012-11-26 DIAGNOSIS — M129 Arthropathy, unspecified: Secondary | ICD-10-CM | POA: Diagnosis not present

## 2012-11-26 DIAGNOSIS — S61409A Unspecified open wound of unspecified hand, initial encounter: Secondary | ICD-10-CM | POA: Insufficient documentation

## 2012-11-26 DIAGNOSIS — Z9861 Coronary angioplasty status: Secondary | ICD-10-CM | POA: Diagnosis not present

## 2012-11-26 DIAGNOSIS — Z8659 Personal history of other mental and behavioral disorders: Secondary | ICD-10-CM | POA: Diagnosis not present

## 2012-11-26 DIAGNOSIS — W278XXA Contact with other nonpowered hand tool, initial encounter: Secondary | ICD-10-CM | POA: Insufficient documentation

## 2012-11-26 DIAGNOSIS — Z7982 Long term (current) use of aspirin: Secondary | ICD-10-CM | POA: Insufficient documentation

## 2012-11-26 DIAGNOSIS — S61412A Laceration without foreign body of left hand, initial encounter: Secondary | ICD-10-CM

## 2012-11-26 NOTE — ED Notes (Signed)
Pt reports cutting his left hand while working on a pressure washer.

## 2012-11-26 NOTE — ED Provider Notes (Addendum)
History     CSN: [id number]  Arrival date & time 11/26/12  1322   First MD Initiated Contact with Patient 11/26/12 1403      Chief Complaint  Patient presents with  . Left Hand Laceration     (Consider location/radiation/quality/duration/timing/severity/associated sxs/prior treatment) Patient is a 72 y.o. male presenting with skin laceration. The history is provided by the patient.  Laceration Location:  Hand Hand laceration location:  L hand Length (cm):  3 Depth:  Through dermis Quality: straight   Bleeding: arterial and controlled with pressure   Time since incident:  1 hour Laceration mechanism:  Metal edge Pain details:    Quality:  Aching   Severity:  Mild   Timing:  Constant Foreign body present:  No foreign bodies Relieved by:  Pressure Worsened by:  Movement Tetanus status:  Up to date   Past Medical History  Diagnosis Date  . CAD (coronary artery disease)   . Hyperlipidemia   . HTN (hypertension)   . MI (myocardial infarction)   . Depression   . Arthritis     Past Surgical History  Procedure Laterality Date  . Stent x2 in the lad  with intra-aortic balloon pump support  1996  . Stent to the lad  1997  . Knee surgery    . Colonoscopy      hx of polyps  . Tonsillectomy    . Brain tumor excision  1954    benign tumor in back of head, done at  [facility] History  Problem Relation Age of Onset  . Heart attack Mother     69s  . Heart attack Brother 72  . Cancer Sister   . Colon cancer Neg Hx   . Stomach cancer Neg Hx   . Rectal cancer Neg Hx   . Esophageal cancer Neg Hx     History  Substance Use Topics  . Smoking status: Former Smoker    Quit date: 06/06/1994  . Smokeless tobacco: Never Used  . Alcohol Use: No      Review of Systems  Neurological: Negative for weakness and numbness.  All other systems reviewed and are negative.    Allergies  Review of patient's allergies indicates no known allergies.  Home Medications    Current Outpatient Rx  Name  Route  Sig  Dispense  Refill  . aspirin 81 MG tablet   Oral   Take 81 mg by mouth daily.           Marland Kitchen atorvastatin (LIPITOR) 80 MG tablet   Oral   Take 80 mg by mouth daily.           . cyclobenzaprine (FLEXERIL) 10 MG tablet   Oral   Take 10 mg by mouth 3 (three) times daily as needed.         Marland Kitchen esomeprazole (NEXIUM) 40 MG capsule   Oral   Take 40 mg by mouth daily.          . meloxicam (MOBIC) 7.5 MG tablet   Oral   Take 7.5 mg by mouth 2 (two) times daily.           . metoprolol (LOPRESSOR) 50 MG tablet   Oral   Take 50 mg by mouth daily.         . nitroGLYCERIN (NITROSTAT) 0.4 MG SL tablet   Sublingual   Place 0.4 mg under the tongue every 5 (five) minutes as needed.  BP 136/85  Pulse 65  Temp(Src) 98.1 F (36.7 C) (Oral)  Resp 16  Ht 5\' 11"  (1.803 m)  SpO2 96%  Physical Exam  Nursing note and vitals reviewed. Constitutional: He is oriented to person, place, and time. He appears well-developed and well-nourished. No distress.  HENT:  Head: Normocephalic and atraumatic.  Mouth/Throat: Oropharynx is clear and moist.  Eyes: Conjunctivae and EOM are normal. Pupils are equal, round, and reactive to light.  Neck: Normal range of motion. Neck supple.  Cardiovascular: Normal rate and intact distal pulses.   Pulmonary/Chest: Effort normal.  Musculoskeletal: Normal range of motion. He exhibits no edema and no tenderness.       Left hand: He exhibits laceration. He exhibits normal two-point discrimination, normal capillary refill and no deformity. Normal sensation noted. Normal strength noted.       Hands: Neurological: He is alert and oriented to person, place, and time.  Skin: Skin is warm and dry. No rash noted. No erythema.  Psychiatric: He has a normal mood and affect. His behavior is normal.    ED Course  Procedures (including critical care time)  Labs Reviewed - No data to display No results  found.  LACERATION REPAIR Performed by: Gwyneth Sprout Authorized byGwyneth Sprout Consent: Verbal consent obtained. Risks and benefits: risks, benefits and alternatives were discussed Consent given by: patient Patient identity confirmed: provided demographic data Prepped and Draped in normal sterile fashion Wound explored  Laceration Location: left hand  Laceration Length: 3cm  No Foreign Bodies seen or palpated  Anesthesia: local infiltration  Local anesthetic: lidocaine 1% with epinephrine  Anesthetic total: 2 ml  Irrigation method: syringe Amount of cleaning: standard  Skin closure: 4.0 prolene  Number of sutures: 5  Technique: simple interrupted  Patient tolerance: Patient tolerated the procedure well with no immediate complications.  1. Hand laceration, left, initial encounter       MDM   Pt with hand laceration without tendon involvement or neurologic compromise.  Wound repaired.  Tetanus UTD.        Gwyneth Sprout, MD 11/26/12 1437  Gwyneth Sprout, MD 11/26/12 1438

## 2012-12-03 DIAGNOSIS — S61409A Unspecified open wound of unspecified hand, initial encounter: Secondary | ICD-10-CM | POA: Diagnosis not present

## 2013-01-14 DIAGNOSIS — M5126 Other intervertebral disc displacement, lumbar region: Secondary | ICD-10-CM | POA: Diagnosis not present

## 2013-01-14 DIAGNOSIS — M5137 Other intervertebral disc degeneration, lumbosacral region: Secondary | ICD-10-CM | POA: Diagnosis not present

## 2013-01-14 DIAGNOSIS — M47817 Spondylosis without myelopathy or radiculopathy, lumbosacral region: Secondary | ICD-10-CM | POA: Diagnosis not present

## 2013-01-14 DIAGNOSIS — M545 Low back pain: Secondary | ICD-10-CM | POA: Diagnosis not present

## 2013-01-23 DIAGNOSIS — M545 Low back pain: Secondary | ICD-10-CM | POA: Diagnosis not present

## 2013-01-24 DIAGNOSIS — M549 Dorsalgia, unspecified: Secondary | ICD-10-CM | POA: Diagnosis not present

## 2013-01-25 DIAGNOSIS — Z23 Encounter for immunization: Secondary | ICD-10-CM | POA: Diagnosis not present

## 2013-01-30 ENCOUNTER — Other Ambulatory Visit: Payer: Self-pay | Admitting: Orthopedic Surgery

## 2013-01-30 DIAGNOSIS — M549 Dorsalgia, unspecified: Secondary | ICD-10-CM

## 2013-02-06 DIAGNOSIS — I10 Essential (primary) hypertension: Secondary | ICD-10-CM | POA: Diagnosis not present

## 2013-02-06 DIAGNOSIS — Z0000 Encounter for general adult medical examination without abnormal findings: Secondary | ICD-10-CM | POA: Diagnosis not present

## 2013-02-06 DIAGNOSIS — Z125 Encounter for screening for malignant neoplasm of prostate: Secondary | ICD-10-CM | POA: Diagnosis not present

## 2013-02-06 DIAGNOSIS — Z1331 Encounter for screening for depression: Secondary | ICD-10-CM | POA: Diagnosis not present

## 2013-02-06 DIAGNOSIS — E1129 Type 2 diabetes mellitus with other diabetic kidney complication: Secondary | ICD-10-CM | POA: Diagnosis not present

## 2013-02-07 ENCOUNTER — Ambulatory Visit
Admission: RE | Admit: 2013-02-07 | Discharge: 2013-02-07 | Disposition: A | Discharge status: Home / Self Care | Payer: Medicare Other | Source: Ambulatory Visit | Attending: Orthopedic Surgery | Admitting: Orthopedic Surgery

## 2013-02-07 VITALS — BP 142/88 | HR 74

## 2013-02-07 DIAGNOSIS — M549 Dorsalgia, unspecified: Secondary | ICD-10-CM

## 2013-02-07 MED ORDER — DIAZEPAM 5 MG PO TABS
5.0000 mg | ORAL_TABLET | Freq: Once | ORAL | Status: AC
  Administered 2013-02-07: 5 mg via ORAL

## 2013-02-07 MED ORDER — IOHEXOL 180 MG/ML  SOLN
15.0000 mL | Freq: Once | INTRAMUSCULAR | Status: AC | PRN
  Administered 2013-02-07: 15 mL via INTRATHECAL

## 2013-02-15 DIAGNOSIS — M48061 Spinal stenosis, lumbar region without neurogenic claudication: Secondary | ICD-10-CM | POA: Diagnosis not present

## 2013-02-15 DIAGNOSIS — M5126 Other intervertebral disc displacement, lumbar region: Secondary | ICD-10-CM | POA: Diagnosis not present

## 2013-02-26 DIAGNOSIS — M48061 Spinal stenosis, lumbar region without neurogenic claudication: Secondary | ICD-10-CM | POA: Diagnosis not present

## 2013-03-06 DIAGNOSIS — I10 Essential (primary) hypertension: Secondary | ICD-10-CM | POA: Diagnosis not present

## 2013-03-06 DIAGNOSIS — E1129 Type 2 diabetes mellitus with other diabetic kidney complication: Secondary | ICD-10-CM | POA: Diagnosis not present

## 2013-03-06 DIAGNOSIS — I2510 Atherosclerotic heart disease of native coronary artery without angina pectoris: Secondary | ICD-10-CM | POA: Diagnosis not present

## 2013-03-06 DIAGNOSIS — N182 Chronic kidney disease, stage 2 (mild): Secondary | ICD-10-CM | POA: Diagnosis not present

## 2013-07-04 DIAGNOSIS — H5500 Unspecified nystagmus: Secondary | ICD-10-CM | POA: Diagnosis not present

## 2013-07-04 DIAGNOSIS — H524 Presbyopia: Secondary | ICD-10-CM | POA: Diagnosis not present

## 2013-07-04 DIAGNOSIS — H31019 Macula scars of posterior pole (postinflammatory) (post-traumatic), unspecified eye: Secondary | ICD-10-CM | POA: Diagnosis not present

## 2013-07-04 DIAGNOSIS — H04129 Dry eye syndrome of unspecified lacrimal gland: Secondary | ICD-10-CM | POA: Diagnosis not present

## 2013-07-04 DIAGNOSIS — Z961 Presence of intraocular lens: Secondary | ICD-10-CM | POA: Diagnosis not present

## 2013-09-04 DIAGNOSIS — E785 Hyperlipidemia, unspecified: Secondary | ICD-10-CM | POA: Diagnosis not present

## 2013-09-04 DIAGNOSIS — I10 Essential (primary) hypertension: Secondary | ICD-10-CM | POA: Diagnosis not present

## 2013-09-04 DIAGNOSIS — I2510 Atherosclerotic heart disease of native coronary artery without angina pectoris: Secondary | ICD-10-CM | POA: Diagnosis not present

## 2013-09-04 DIAGNOSIS — E1129 Type 2 diabetes mellitus with other diabetic kidney complication: Secondary | ICD-10-CM | POA: Diagnosis not present

## 2013-09-16 DIAGNOSIS — S6990XA Unspecified injury of unspecified wrist, hand and finger(s), initial encounter: Secondary | ICD-10-CM | POA: Diagnosis not present

## 2013-09-16 DIAGNOSIS — S59909A Unspecified injury of unspecified elbow, initial encounter: Secondary | ICD-10-CM | POA: Diagnosis not present

## 2013-09-16 DIAGNOSIS — M25529 Pain in unspecified elbow: Secondary | ICD-10-CM | POA: Diagnosis not present

## 2013-09-17 DIAGNOSIS — M25539 Pain in unspecified wrist: Secondary | ICD-10-CM | POA: Diagnosis not present

## 2013-09-17 DIAGNOSIS — S52123A Displaced fracture of head of unspecified radius, initial encounter for closed fracture: Secondary | ICD-10-CM | POA: Diagnosis not present

## 2013-09-24 DIAGNOSIS — M79609 Pain in unspecified limb: Secondary | ICD-10-CM | POA: Diagnosis not present

## 2013-09-25 ENCOUNTER — Ambulatory Visit (HOSPITAL_COMMUNITY)
Admission: RE | Admit: 2013-09-25 | Discharge: 2013-09-25 | Disposition: A | Discharge status: Home / Self Care | Payer: Medicare Other | Source: Ambulatory Visit | Attending: Family Medicine | Admitting: Family Medicine

## 2013-09-25 ENCOUNTER — Other Ambulatory Visit (HOSPITAL_COMMUNITY): Payer: Self-pay | Admitting: Family Medicine

## 2013-09-25 DIAGNOSIS — M25529 Pain in unspecified elbow: Secondary | ICD-10-CM

## 2013-09-25 DIAGNOSIS — M7989 Other specified soft tissue disorders: Secondary | ICD-10-CM

## 2013-09-25 DIAGNOSIS — M79609 Pain in unspecified limb: Secondary | ICD-10-CM | POA: Diagnosis not present

## 2013-09-25 DIAGNOSIS — M25429 Effusion, unspecified elbow: Secondary | ICD-10-CM | POA: Insufficient documentation

## 2013-09-25 NOTE — Progress Notes (Signed)
*  Preliminary Results* Left upper extremity venous duplex completed. Left upper extremity is negative for deep and superficial vein thrombosis.  09/25/2013 3:48 PM  Maudry Mayhew, RVT, RDCS, RDMS

## 2013-09-26 ENCOUNTER — Ambulatory Visit (HOSPITAL_COMMUNITY): Payer: Medicare Other

## 2014-02-05 DIAGNOSIS — Z23 Encounter for immunization: Secondary | ICD-10-CM | POA: Diagnosis not present

## 2014-02-19 DIAGNOSIS — M48061 Spinal stenosis, lumbar region without neurogenic claudication: Secondary | ICD-10-CM | POA: Diagnosis not present

## 2014-03-10 DIAGNOSIS — Z125 Encounter for screening for malignant neoplasm of prostate: Secondary | ICD-10-CM | POA: Diagnosis not present

## 2014-03-10 DIAGNOSIS — I10 Essential (primary) hypertension: Secondary | ICD-10-CM | POA: Diagnosis not present

## 2014-03-10 DIAGNOSIS — E1129 Type 2 diabetes mellitus with other diabetic kidney complication: Secondary | ICD-10-CM | POA: Diagnosis not present

## 2014-03-10 DIAGNOSIS — Z1389 Encounter for screening for other disorder: Secondary | ICD-10-CM | POA: Diagnosis not present

## 2014-03-10 DIAGNOSIS — Z0000 Encounter for general adult medical examination without abnormal findings: Secondary | ICD-10-CM | POA: Diagnosis not present

## 2014-03-14 DIAGNOSIS — I10 Essential (primary) hypertension: Secondary | ICD-10-CM | POA: Diagnosis not present

## 2014-03-14 DIAGNOSIS — E1129 Type 2 diabetes mellitus with other diabetic kidney complication: Secondary | ICD-10-CM | POA: Diagnosis not present

## 2014-03-14 DIAGNOSIS — I2510 Atherosclerotic heart disease of native coronary artery without angina pectoris: Secondary | ICD-10-CM | POA: Diagnosis not present

## 2014-03-14 DIAGNOSIS — E785 Hyperlipidemia, unspecified: Secondary | ICD-10-CM | POA: Diagnosis not present

## 2014-03-14 DIAGNOSIS — R0602 Shortness of breath: Secondary | ICD-10-CM | POA: Diagnosis not present

## 2014-03-14 DIAGNOSIS — R0689 Other abnormalities of breathing: Secondary | ICD-10-CM | POA: Diagnosis not present

## 2014-03-18 DIAGNOSIS — M4807 Spinal stenosis, lumbosacral region: Secondary | ICD-10-CM | POA: Diagnosis not present

## 2014-05-28 DIAGNOSIS — K219 Gastro-esophageal reflux disease without esophagitis: Secondary | ICD-10-CM | POA: Diagnosis not present

## 2014-06-11 DIAGNOSIS — J309 Allergic rhinitis, unspecified: Secondary | ICD-10-CM | POA: Diagnosis not present

## 2014-06-11 DIAGNOSIS — K219 Gastro-esophageal reflux disease without esophagitis: Secondary | ICD-10-CM | POA: Diagnosis not present

## 2014-06-11 DIAGNOSIS — K5900 Constipation, unspecified: Secondary | ICD-10-CM | POA: Diagnosis not present

## 2014-06-11 DIAGNOSIS — F329 Major depressive disorder, single episode, unspecified: Secondary | ICD-10-CM | POA: Diagnosis not present

## 2014-06-19 DIAGNOSIS — R509 Fever, unspecified: Secondary | ICD-10-CM | POA: Diagnosis not present

## 2014-06-19 DIAGNOSIS — J984 Other disorders of lung: Secondary | ICD-10-CM | POA: Diagnosis not present

## 2014-06-19 DIAGNOSIS — J9811 Atelectasis: Secondary | ICD-10-CM | POA: Diagnosis not present

## 2014-06-19 DIAGNOSIS — R05 Cough: Secondary | ICD-10-CM | POA: Diagnosis not present

## 2014-06-19 DIAGNOSIS — R110 Nausea: Secondary | ICD-10-CM | POA: Diagnosis not present

## 2014-07-02 DIAGNOSIS — R110 Nausea: Secondary | ICD-10-CM | POA: Diagnosis not present

## 2014-07-02 DIAGNOSIS — J309 Allergic rhinitis, unspecified: Secondary | ICD-10-CM | POA: Diagnosis not present

## 2014-07-08 ENCOUNTER — Telehealth: Payer: Self-pay | Admitting: Gastroenterology

## 2014-07-08 NOTE — Telephone Encounter (Signed)
Pt has been scheduled to see Cecille Rubin on [place] for nausea x 3 weeks he will bring recent ENT and PCP records with him

## 2014-07-10 ENCOUNTER — Ambulatory Visit (HOSPITAL_COMMUNITY)
Admission: RE | Admit: 2014-07-10 | Discharge: 2014-07-10 | Disposition: A | Discharge status: Home / Self Care | Payer: Medicare Other | Source: Ambulatory Visit | Attending: Physician Assistant | Admitting: Physician Assistant

## 2014-07-10 ENCOUNTER — Encounter: Payer: Self-pay | Admitting: *Deleted

## 2014-07-10 ENCOUNTER — Ambulatory Visit (INDEPENDENT_AMBULATORY_CARE_PROVIDER_SITE_OTHER): Payer: Medicare Other | Admitting: Physician Assistant

## 2014-07-10 ENCOUNTER — Encounter (HOSPITAL_COMMUNITY): Payer: Self-pay

## 2014-07-10 ENCOUNTER — Other Ambulatory Visit (INDEPENDENT_AMBULATORY_CARE_PROVIDER_SITE_OTHER): Payer: Medicare Other

## 2014-07-10 VITALS — BP 130/74 | HR 60 | Ht 71.0 in | Wt 211.2 lb

## 2014-07-10 DIAGNOSIS — K219 Gastro-esophageal reflux disease without esophagitis: Secondary | ICD-10-CM

## 2014-07-10 DIAGNOSIS — R634 Abnormal weight loss: Secondary | ICD-10-CM

## 2014-07-10 DIAGNOSIS — R109 Unspecified abdominal pain: Secondary | ICD-10-CM | POA: Insufficient documentation

## 2014-07-10 DIAGNOSIS — R110 Nausea: Secondary | ICD-10-CM

## 2014-07-10 DIAGNOSIS — R630 Anorexia: Secondary | ICD-10-CM

## 2014-07-10 DIAGNOSIS — N281 Cyst of kidney, acquired: Secondary | ICD-10-CM | POA: Diagnosis not present

## 2014-07-10 LAB — COMPREHENSIVE METABOLIC PANEL
ALT: 14 U/L (ref 0–53)
AST: 15 U/L (ref 0–37)
Albumin: 4.2 g/dL (ref 3.5–5.2)
Alkaline Phosphatase: 95 U/L (ref 39–117)
BUN: 20 mg/dL (ref 6–23)
CALCIUM: 9.6 mg/dL (ref 8.4–10.5)
CHLORIDE: 103 meq/L (ref 96–112)
CO2: 30 mEq/L (ref 19–32)
CREATININE: 1.08 mg/dL (ref 0.40–1.50)
GFR: 71.4 mL/min (ref >60.00)
GLUCOSE: 110 mg/dL — AB (ref 70–99)
POTASSIUM: 4.2 meq/L (ref 3.5–5.1)
SODIUM: 142 meq/L (ref 135–145)
Total Bilirubin: 0.6 mg/dL (ref 0.2–1.2)
Total Protein: 7.2 g/dL (ref 6.0–8.3)

## 2014-07-10 LAB — CBC WITH DIFFERENTIAL/PLATELET
BASOS ABS: 0 10*3/uL (ref 0.0–0.1)
Basophils Relative: 0.5 % (ref 0.0–3.0)
EOS ABS: 0.1 10*3/uL (ref 0.0–0.7)
EOS PCT: 1.3 % (ref 0.0–5.0)
HEMATOCRIT: 46.5 % (ref 39.0–52.0)
HEMOGLOBIN: 15.7 g/dL (ref 13.0–17.0)
LYMPHS ABS: 1.8 10*3/uL (ref 0.7–4.0)
Lymphocytes Relative: 25.5 % (ref 12.0–46.0)
MCHC: 33.8 g/dL (ref 30.0–36.0)
MCV: 88.5 fl (ref 78.0–100.0)
Monocytes Absolute: 0.7 10*3/uL (ref 0.1–1.0)
Monocytes Relative: 9.6 % (ref 3.0–12.0)
NEUTROS ABS: 4.5 10*3/uL (ref 1.4–7.7)
Neutrophils Relative %: 63.1 % (ref 43.0–77.0)
PLATELETS: 224 10*3/uL (ref 150.0–400.0)
RBC: 5.25 Mil/uL (ref 4.22–5.81)
RDW: 14.5 % (ref 11.5–15.5)
WBC: 7.1 10*3/uL (ref 4.0–10.5)

## 2014-07-10 LAB — AMYLASE: Amylase: 29 U/L (ref 27–131)

## 2014-07-10 LAB — LIPASE: LIPASE: 24 U/L (ref 11.0–59.0)

## 2014-07-10 MED ORDER — IOHEXOL 300 MG/ML  SOLN
100.0000 mL | Freq: Once | INTRAMUSCULAR | Status: AC | PRN
  Administered 2014-07-10: 100 mL via INTRAVENOUS

## 2014-07-10 NOTE — Patient Instructions (Signed)
Your physician has requested that you go to the basement for the following lab work before leaving today: CBC, CMET, Amylase, Lipase, TSH, H pylori stool antigen  You have been scheduled for an endoscopy. Please follow written instructions given to you at your visit today. If you use inhalers (even only as needed), please bring them with you on the day of your procedure. Your physician has requested that you go to www.startemmi.com and enter the access code given to you at your visit today. This web site gives a general overview about your procedure. However, you should still follow specific instructions given to you by our office regarding your preparation for the procedure.  Please continue your pantoprazole.  We will request your records from ENT.  You have been scheduled for a CT scan of the abdomen and pelvis at [facility] (1st floor of hospital).   You are scheduled on TODAY at 1:00 pm. You should arrive 15 minutes prior to your appointment time for registration. Please follow the written instructions below on the day of your exam:  WARNING: IF YOU ARE ALLERGIC TO IODINE/X-RAY DYE, PLEASE NOTIFY RADIOLOGY IMMEDIATELY AT [phone]! YOU WILL BE GIVEN A 13 HOUR PREMEDICATION PREP.  1) Do not eat or drink anything after 9:00 am (4 hours prior to your test) 2) You have been given 2 bottles of oral contrast to drink. The solution may taste better if refrigerated, but do NOT add ice or any other liquid to this solution. Shake well before drinking.    Drink 1 bottle of contrast @ 11:00 am (2 hours prior to your exam)  Drink 1 bottle of contrast @ 12:00 pm (1 hour prior to your exam)  You may take any medications as prescribed with a small amount of water except for the following: Metformin, Glucophage, Glucovance, Avandamet, Riomet, Fortamet, Actoplus Met, Janumet, Glumetza or Metaglip. The above medications must be held the day of the exam AND 48 hours after the exam.  The  purpose of you drinking the oral contrast is to aid in the visualization of your intestinal tract. The contrast solution may cause some diarrhea. Before your exam is started, you will be given a small amount of fluid to drink. Depending on your individual set of symptoms, you may also receive an intravenous injection of x-ray contrast/dye. Plan on being at [facility] for 30 minutes or long, depending on the type of exam you are having performed.  This test typically takes 30-45 minutes to complete.  If you have any questions regarding your exam or if you need to reschedule, you may call the CT department at [phone] between the hours of 8:00 am and 5:00 pm, Monday-Friday.  ________________________________________________________________________  CC:Dr Teola Bradley

## 2014-07-10 NOTE — Progress Notes (Signed)
i agree with the above note, plan 

## 2014-07-10 NOTE — Progress Notes (Signed)
Patient ID: [name], male   DOB: 1940/07/01, 74 y.o.   MRN: [id number]     History of Present Illness:    [name] is a pleasant 74 year old male who was last seen by Dr. Ardis Hughs in July 2013 with complaints of a rough feeling in his throat. He had an EGD on 12/27/2011 that revealed moderate gastritis and a regular Z line and was otherwise normal. Biopsies were negative for H. pylori or intestinal metaplasia, dysplasia or malignancy. He was advised to continue his PPI. He is here today with complaints of a 3 week history of nausea that has been constant. His nausea tends to be worse on an empty stomach and is temporary really relieved with ingestion of food but never goes away he has had no vomiting. He feels full after 2 bites and feels the food does not move out of his stomach. He has no dysphagia, belching, or burping. He does get nocturnal regurgitation and states mouthfuls of foul-smelling brown fluid coming up Ingestion of Pepto-Bismol decreases the nausea but doesn't make it go away he was on twice a day Nexium and 3 weeks ago this was changed to twice a day pantoprazole by his PCP but has provided no relief. He was prescribed promethazine by his PCP and it provides only transient relief at best. His appetite has been diminished and he has lost 5 or 6 pounds. He is moving his bowels daily but states they are light clay-colored or Grey for the past few weeks. He has no fever, chills, or night sweats and says he just doesn't feel right and the nausea is unrelenting.   Past Medical History  Diagnosis Date  . CAD (coronary artery disease)   . Hyperlipidemia   . HTN (hypertension)   . MI (myocardial infarction)   . Depression   . Arthritis   . Adenomatous colon polyp   . Diverticulosis     Past Surgical History  Procedure Laterality Date  . Stent x2 in the lad  with intra-aortic balloon pump support  1996  . Stent to the [place]  . Knee surgery    . Colonoscopy      hx of polyps    . Tonsillectomy    . Brain tumor excision  1954    benign tumor in back of head, done at  [place] [facility] History  Problem Relation Age of Onset  . Heart attack Mother     50s  . Heart attack Brother 3  . Cancer Sister   . Colon cancer Neg Hx   . Stomach cancer Neg Hx   . Rectal cancer Neg Hx   . Esophageal cancer Neg Hx    History  Substance Use Topics  . Smoking status: Former Smoker    Quit date: 06/06/1994  . Smokeless tobacco: Never Used  . Alcohol Use: No   Current Outpatient Prescriptions  Medication Sig Dispense Refill  . aspirin 81 MG tablet Take 81 mg by mouth daily.      Marland Kitchen atorvastatin (LIPITOR) 80 MG tablet Take 80 mg by mouth daily.      Marland Kitchen FLUoxetine (PROZAC) 20 MG tablet Take 20 mg by mouth daily.    . hydrocodone-acetaminophen (LORCET-HD) 5-500 MG per capsule Take 1 capsule by mouth every 6 (six) hours as needed for pain.    . meloxicam (MOBIC) 7.5 MG tablet Take 7.5 mg by mouth 2 (two) times daily.      . metoprolol (LOPRESSOR) 50  MG tablet Take 50 mg by mouth daily.    . nitroGLYCERIN (NITROSTAT) 0.4 MG SL tablet Place 0.4 mg under the tongue every 5 (five) minutes as needed.      . pantoprazole (PROTONIX) 40 MG tablet Take 40 mg by mouth 2 (two) times daily.    . promethazine (PHENERGAN) 25 MG tablet Take 25 mg by mouth every 6 (six) hours as needed for nausea or vomiting.     Current Facility-Administered Medications  Medication Dose Route Frequency Provider Last Rate Last Dose  . 0.9 %  sodium chloride infusion  500 mL Intravenous Continuous Milus Banister, MD       No Known Allergies    Review of Systems: Gen: Denies any fever, chills, sweats, anorexia, fatigue, weakness, malaise, weight loss, and sleep disorder CV: Denies chest pain, angina, palpitations, syncope, orthopnea, PND, peripheral edema, and claudication. Resp: Denies dyspnea at rest, dyspnea with exercise, cough, sputum, wheezing, coughing up blood, and pleurisy. GI: Denies vomiting  blood, jaundice, and fecal incontinence.   Denies dysphagia or odynophagia. GU : Denies urinary burning, blood in urine, urinary frequency, urinary hesitancy, nocturnal urination, and urinary incontinence. MS: Denies joint pain, limitation of movement, and swelling, stiffness, low back pain, extremity pain. Denies muscle weakness, cramps, atrophy.  Derm: Denies rash, itching, dry skin, hives, moles, warts, or unhealing ulcers.  Psych: Denies depression, anxiety, memory loss, suicidal ideation, hallucinations, paranoia, and confusion. Heme: Denies bruising, bleeding, and enlarged lymph nodes. Neuro:  Denies any headaches, dizziness, paresthesia Endo:  Denies any problems with DM, thyroid, adrenal  Endoscopies: EGD 12/27/11: Moderate gastritis, irregular Z line, otherwise normal examination. Biopsies with mild chronic gastritis negative for H. pylori, dysplasia or malignancy. Esophageal biopsies negative for intestinal metaplasia, dysplasia or malignancy.  Colonoscopy 09/16/11: Mild diverticulosis otherwise normal exam. Due to a personal history of polyps patient was advised to have a surveillance colonoscopy in 5 years.  Physical Exam: General: Pleasant, well developed male in no acute distress Head: Normocephalic and atraumatic Eyes:  sclerae anicteric, conjunctiva pink  Ears: Normal auditory acuity Lungs: Clear throughout to auscultation Heart: Regular rate and rhythm Abdomen: Soft, non distended, non-tender. No masses, no hepatomegaly. Normal bowel sounds Musculoskeletal: Symmetrical with no gross deformities  Extremities: No edema  Neurological: Alert oriented x 4, grossly nonfocal Psychological:  Alert and cooperative. Normal mood and affect  Assessment and Recommendations: 74 year old male with a history of GERD and gastritis restenting with nausea, anorexia, worsening reflux, and weight loss. A CBC, comprehensive metabolic panel, amylase, lipase, TSH will be obtained. He will be  scheduled for a CT of the abdomen and pelvis to evaluate for any intra-abdominal pathology. He will also be scheduled for an EGD D to assess for esophagitis, gastritis, ulcer, as well as to evaluate for any possible retained food.The risks, benefits, and alternatives to endoscopy with possible biopsy and possible dilation were discussed with the patient and they consent to proceed. The procedure will be scheduled with Dr. Ardis Hughs.  Further recommendations will be made pending the findings of the above.        Theran Vandergrift, Vita Barley PA-C 07/10/2014,

## 2014-07-11 ENCOUNTER — Telehealth: Payer: Self-pay | Admitting: *Deleted

## 2014-07-11 LAB — TSH: TSH: 2.74 u[IU]/mL (ref 0.35–4.50)

## 2014-07-11 NOTE — Telephone Encounter (Signed)
Left message for patient to call back. Patient's endoscopy procedure date/time has been changed as patient should have been scheduled with Dr Ardis Hughs but was scheduled with Dr Henrene Pastor in error.

## 2014-07-11 NOTE — Telephone Encounter (Signed)
Advised patient of date/time/physician change for endoscopy and he verbalizes understanding of this. I

## 2014-07-11 NOTE — Telephone Encounter (Signed)
Patient states that Cecille Rubin told him at his visit that she would give him some medication to help him. I spoke with Cecille Rubin about this and she says that she meant after he has all of his testing, we can give him medications depending on findings. I advised patient of this and he verbalizes understanding. I also asked that he come back to the lab for H pylori stool antigen as the lab did not originally give him a stool specimen container when this test was ordered on 07/10/14. He states he will do this next week.

## 2014-07-14 ENCOUNTER — Other Ambulatory Visit: Payer: Medicare Other

## 2014-07-14 DIAGNOSIS — K219 Gastro-esophageal reflux disease without esophagitis: Secondary | ICD-10-CM

## 2014-07-14 DIAGNOSIS — R630 Anorexia: Secondary | ICD-10-CM

## 2014-07-14 DIAGNOSIS — R634 Abnormal weight loss: Secondary | ICD-10-CM

## 2014-07-14 DIAGNOSIS — R110 Nausea: Secondary | ICD-10-CM

## 2014-07-15 ENCOUNTER — Other Ambulatory Visit: Payer: Self-pay | Admitting: *Deleted

## 2014-07-15 ENCOUNTER — Encounter: Payer: Medicare Other | Admitting: Internal Medicine

## 2014-07-15 LAB — HELICOBACTER PYLORI  SPECIAL ANTIGEN: H. PYLORI Antigen: POSITIVE

## 2014-07-15 MED ORDER — AMOXICILLIN 500 MG PO TABS: ORAL_TABLET | ORAL | Status: DC

## 2014-07-15 MED ORDER — CLARITHROMYCIN 500 MG PO TABS
ORAL_TABLET | ORAL | Status: DC
Start: 2014-07-15 — End: 2014-08-01

## 2014-07-17 ENCOUNTER — Encounter: Payer: Self-pay | Admitting: Physician Assistant

## 2014-07-28 ENCOUNTER — Telehealth: Payer: Self-pay | Admitting: Gastroenterology

## 2014-07-28 ENCOUNTER — Encounter: Payer: Medicare Other | Admitting: Gastroenterology

## 2014-08-01 ENCOUNTER — Encounter: Payer: Self-pay | Admitting: Gastroenterology

## 2014-08-01 ENCOUNTER — Ambulatory Visit (AMBULATORY_SURGERY_CENTER): Payer: Medicare Other | Admitting: Gastroenterology

## 2014-08-01 VITALS — BP 144/93 | HR 51 | Temp 97.5°F | Resp 13 | Ht 71.0 in | Wt 211.0 lb

## 2014-08-01 DIAGNOSIS — I252 Old myocardial infarction: Secondary | ICD-10-CM | POA: Diagnosis not present

## 2014-08-01 DIAGNOSIS — R110 Nausea: Secondary | ICD-10-CM | POA: Diagnosis not present

## 2014-08-01 DIAGNOSIS — K2970 Gastritis, unspecified, without bleeding: Secondary | ICD-10-CM | POA: Diagnosis not present

## 2014-08-01 DIAGNOSIS — I10 Essential (primary) hypertension: Secondary | ICD-10-CM | POA: Diagnosis not present

## 2014-08-01 DIAGNOSIS — K2990 Gastroduodenitis, unspecified, without bleeding: Secondary | ICD-10-CM | POA: Diagnosis not present

## 2014-08-01 DIAGNOSIS — I2510 Atherosclerotic heart disease of native coronary artery without angina pectoris: Secondary | ICD-10-CM | POA: Diagnosis not present

## 2014-08-01 DIAGNOSIS — R630 Anorexia: Secondary | ICD-10-CM | POA: Diagnosis not present

## 2014-08-01 DIAGNOSIS — R634 Abnormal weight loss: Secondary | ICD-10-CM | POA: Diagnosis not present

## 2014-08-01 DIAGNOSIS — K2950 Unspecified chronic gastritis without bleeding: Secondary | ICD-10-CM | POA: Diagnosis not present

## 2014-08-01 MED ORDER — SODIUM CHLORIDE 0.9 % IV SOLN: 500.0000 mL | INTRAVENOUS | Status: DC

## 2014-08-01 NOTE — Progress Notes (Signed)
Called to room to assist during endoscopic procedure.  Patient ID and intended procedure confirmed with present staff. Received instructions for my participation in the procedure from the performing physician.  

## 2014-08-01 NOTE — Op Note (Signed)
[facility]  [address] [place], [id number]   ENDOSCOPY PROCEDURE REPORT  PATIENT: [name], [name]  MR#: [id number] BIRTHDATE: 1940/11/28 , 73  yrs. old GENDER: male ENDOSCOPIST: Milus Banister, MD PROCEDURE DATE:  08/01/2014 PROCEDURE:  EGD w/ biopsy ASA CLASS:     Class III INDICATIONS:  recent nausea, dyspepsia, (improving) weight loss; cbc, cmet, CT scan abd and pelvis without clear etiology, EGD Ardis Hughs 12/27/2011 that revealed moderate gastritis and an irregular Z line and was otherwise normal. Biopsies were negative for H. pylori or intestinal metaplasia, dysplasia or malignancy. MEDICATIONS: Monitored anesthesia care and Propofol 180 mg IV TOPICAL ANESTHETIC: none  DESCRIPTION OF PROCEDURE: After the risks benefits and alternatives of the procedure were thoroughly explained, informed consent was obtained.  The LB [id number] endoscope was introduced through the mouth and advanced to the second portion of the duodenum , Without limitations.  The instrument was slowly withdrawn as the mucosa was fully examined.  There was mild to moderate non-specific pan gastritis.  This was biopsied distally and sent to pathology.  There was a small amount of retained solid and liquid food in stomach.  The z-line was slightly irregular, but there were no nodules and it looked unchanged from 2013 at which time biopsies showed no Barrett's, biopsies were not repeated today.  The examination was otherwise normal.  Retroflexed views revealed no abnormalities.     The scope was then withdrawn from the patient and the procedure completed.  COMPLICATIONS: There were no immediate complications.  ENDOSCOPIC IMPRESSION: There was mild to moderate non-specific pan gastritis.  This was biopsied distally and sent to pathology.  There was a small amount of retained solid and liquid food in stomach.  The z-line was slightly irregular, but there were no nodules and it  looked unchanged from 2013 at which time biopsies showed no Barrett's, biopsies were not repeated today.  The examination was otherwise normal  RECOMMENDATIONS: Await final pathology.  If biopsies show H.  pylori, you will be started on appropriate antibiotics.  Since your symptoms are already improving, no further GI testing unless new problems arise.   eSigned:  Milus Banister, MD 08/01/2014 10:16 AM    XE:[id number] Alyson Ingles, MD

## 2014-08-01 NOTE — Patient Instructions (Signed)

## 2014-08-01 NOTE — Progress Notes (Signed)
Procedure ends, to recovry, report given and VSS.

## 2014-08-04 ENCOUNTER — Telehealth: Payer: Self-pay | Admitting: *Deleted

## 2014-08-04 NOTE — Telephone Encounter (Signed)
  Follow up Call-  Call back number 08/01/2014 02/07/2013 12/27/2011  Post procedure Call Back phone  # [phone]  Permission to leave phone message Yes - Yes     Patient questions:  Message left to call us if necessary.

## 2014-08-05 ENCOUNTER — Encounter: Payer: Medicare Other | Admitting: Gastroenterology

## 2014-08-07 ENCOUNTER — Encounter: Payer: Self-pay | Admitting: Gastroenterology

## 2014-08-08 ENCOUNTER — Telehealth: Payer: Self-pay | Admitting: Gastroenterology

## 2014-08-08 NOTE — Telephone Encounter (Signed)
Left message for pt to call back  °

## 2014-08-08 NOTE — Telephone Encounter (Signed)
Spoke with pt and he is aware. 

## 2014-09-08 DIAGNOSIS — E785 Hyperlipidemia, unspecified: Secondary | ICD-10-CM | POA: Diagnosis not present

## 2014-09-08 DIAGNOSIS — I10 Essential (primary) hypertension: Secondary | ICD-10-CM | POA: Diagnosis not present

## 2014-09-08 DIAGNOSIS — E1129 Type 2 diabetes mellitus with other diabetic kidney complication: Secondary | ICD-10-CM | POA: Diagnosis not present

## 2014-09-11 NOTE — Telephone Encounter (Signed)
A user error has taken place.

## 2014-09-15 DIAGNOSIS — N182 Chronic kidney disease, stage 2 (mild): Secondary | ICD-10-CM | POA: Diagnosis not present

## 2014-09-15 DIAGNOSIS — E785 Hyperlipidemia, unspecified: Secondary | ICD-10-CM | POA: Diagnosis not present

## 2014-09-15 DIAGNOSIS — I10 Essential (primary) hypertension: Secondary | ICD-10-CM | POA: Diagnosis not present

## 2014-09-15 DIAGNOSIS — I2510 Atherosclerotic heart disease of native coronary artery without angina pectoris: Secondary | ICD-10-CM | POA: Diagnosis not present

## 2014-12-03 DIAGNOSIS — H5500 Unspecified nystagmus: Secondary | ICD-10-CM | POA: Diagnosis not present

## 2014-12-03 DIAGNOSIS — H31002 Unspecified chorioretinal scars, left eye: Secondary | ICD-10-CM | POA: Diagnosis not present

## 2014-12-03 DIAGNOSIS — Z961 Presence of intraocular lens: Secondary | ICD-10-CM | POA: Diagnosis not present

## 2014-12-03 DIAGNOSIS — H31011 Macula scars of posterior pole (postinflammatory) (post-traumatic), right eye: Secondary | ICD-10-CM | POA: Diagnosis not present

## 2015-01-19 ENCOUNTER — Telehealth: Payer: Self-pay | Admitting: Gastroenterology

## 2015-01-19 NOTE — Telephone Encounter (Signed)
Left message for pt to call back  °

## 2015-01-20 NOTE — Telephone Encounter (Signed)
Left message on machine to call back  

## 2015-01-22 NOTE — Telephone Encounter (Signed)
Unable to reach pt will wait for further communication  

## 2015-01-28 DIAGNOSIS — Z23 Encounter for immunization: Secondary | ICD-10-CM | POA: Diagnosis not present

## 2015-01-28 DIAGNOSIS — N390 Urinary tract infection, site not specified: Secondary | ICD-10-CM | POA: Diagnosis not present

## 2015-01-28 DIAGNOSIS — F17211 Nicotine dependence, cigarettes, in remission: Secondary | ICD-10-CM | POA: Diagnosis not present

## 2015-01-28 DIAGNOSIS — R110 Nausea: Secondary | ICD-10-CM | POA: Diagnosis not present

## 2015-01-29 DIAGNOSIS — R110 Nausea: Secondary | ICD-10-CM | POA: Diagnosis not present

## 2015-03-11 DIAGNOSIS — E785 Hyperlipidemia, unspecified: Secondary | ICD-10-CM | POA: Diagnosis not present

## 2015-03-11 DIAGNOSIS — I10 Essential (primary) hypertension: Secondary | ICD-10-CM | POA: Diagnosis not present

## 2015-03-11 DIAGNOSIS — Z125 Encounter for screening for malignant neoplasm of prostate: Secondary | ICD-10-CM | POA: Diagnosis not present

## 2015-03-18 DIAGNOSIS — Z13820 Encounter for screening for osteoporosis: Secondary | ICD-10-CM | POA: Diagnosis not present

## 2015-03-18 DIAGNOSIS — Z23 Encounter for immunization: Secondary | ICD-10-CM | POA: Diagnosis not present

## 2015-03-18 DIAGNOSIS — I129 Hypertensive chronic kidney disease with stage 1 through stage 4 chronic kidney disease, or unspecified chronic kidney disease: Secondary | ICD-10-CM | POA: Diagnosis not present

## 2015-03-18 DIAGNOSIS — F339 Major depressive disorder, recurrent, unspecified: Secondary | ICD-10-CM | POA: Diagnosis not present

## 2015-03-18 DIAGNOSIS — E1122 Type 2 diabetes mellitus with diabetic chronic kidney disease: Secondary | ICD-10-CM | POA: Diagnosis not present

## 2015-03-18 DIAGNOSIS — Z0000 Encounter for general adult medical examination without abnormal findings: Secondary | ICD-10-CM | POA: Diagnosis not present

## 2015-03-18 DIAGNOSIS — I2510 Atherosclerotic heart disease of native coronary artery without angina pectoris: Secondary | ICD-10-CM | POA: Diagnosis not present

## 2015-03-18 DIAGNOSIS — Z0001 Encounter for general adult medical examination with abnormal findings: Secondary | ICD-10-CM | POA: Diagnosis not present

## 2015-03-18 DIAGNOSIS — N182 Chronic kidney disease, stage 2 (mild): Secondary | ICD-10-CM | POA: Diagnosis not present

## 2015-03-18 DIAGNOSIS — F17211 Nicotine dependence, cigarettes, in remission: Secondary | ICD-10-CM | POA: Diagnosis not present

## 2015-07-23 DIAGNOSIS — H04123 Dry eye syndrome of bilateral lacrimal glands: Secondary | ICD-10-CM | POA: Diagnosis not present

## 2015-07-23 DIAGNOSIS — H538 Other visual disturbances: Secondary | ICD-10-CM | POA: Diagnosis not present

## 2015-07-23 DIAGNOSIS — H31011 Macula scars of posterior pole (postinflammatory) (post-traumatic), right eye: Secondary | ICD-10-CM | POA: Diagnosis not present

## 2015-07-23 DIAGNOSIS — Z961 Presence of intraocular lens: Secondary | ICD-10-CM | POA: Diagnosis not present

## 2015-09-16 DIAGNOSIS — E785 Hyperlipidemia, unspecified: Secondary | ICD-10-CM | POA: Diagnosis not present

## 2015-09-16 DIAGNOSIS — E559 Vitamin D deficiency, unspecified: Secondary | ICD-10-CM | POA: Diagnosis not present

## 2015-09-16 DIAGNOSIS — I129 Hypertensive chronic kidney disease with stage 1 through stage 4 chronic kidney disease, or unspecified chronic kidney disease: Secondary | ICD-10-CM | POA: Diagnosis not present

## 2015-09-16 DIAGNOSIS — M8580 Other specified disorders of bone density and structure, unspecified site: Secondary | ICD-10-CM | POA: Diagnosis not present

## 2015-09-16 DIAGNOSIS — E1122 Type 2 diabetes mellitus with diabetic chronic kidney disease: Secondary | ICD-10-CM | POA: Diagnosis not present

## 2015-09-23 DIAGNOSIS — I209 Angina pectoris, unspecified: Secondary | ICD-10-CM | POA: Diagnosis not present

## 2015-09-23 DIAGNOSIS — F339 Major depressive disorder, recurrent, unspecified: Secondary | ICD-10-CM | POA: Diagnosis not present

## 2015-09-23 DIAGNOSIS — E1122 Type 2 diabetes mellitus with diabetic chronic kidney disease: Secondary | ICD-10-CM | POA: Diagnosis not present

## 2015-09-23 DIAGNOSIS — I129 Hypertensive chronic kidney disease with stage 1 through stage 4 chronic kidney disease, or unspecified chronic kidney disease: Secondary | ICD-10-CM | POA: Diagnosis not present

## 2015-09-23 DIAGNOSIS — F17211 Nicotine dependence, cigarettes, in remission: Secondary | ICD-10-CM | POA: Diagnosis not present

## 2015-12-16 DIAGNOSIS — I129 Hypertensive chronic kidney disease with stage 1 through stage 4 chronic kidney disease, or unspecified chronic kidney disease: Secondary | ICD-10-CM | POA: Diagnosis not present

## 2015-12-16 DIAGNOSIS — E785 Hyperlipidemia, unspecified: Secondary | ICD-10-CM | POA: Diagnosis not present

## 2015-12-16 DIAGNOSIS — E1122 Type 2 diabetes mellitus with diabetic chronic kidney disease: Secondary | ICD-10-CM | POA: Diagnosis not present

## 2015-12-23 DIAGNOSIS — I209 Angina pectoris, unspecified: Secondary | ICD-10-CM | POA: Diagnosis not present

## 2015-12-23 DIAGNOSIS — I129 Hypertensive chronic kidney disease with stage 1 through stage 4 chronic kidney disease, or unspecified chronic kidney disease: Secondary | ICD-10-CM | POA: Diagnosis not present

## 2015-12-23 DIAGNOSIS — F17211 Nicotine dependence, cigarettes, in remission: Secondary | ICD-10-CM | POA: Diagnosis not present

## 2015-12-23 DIAGNOSIS — L820 Inflamed seborrheic keratosis: Secondary | ICD-10-CM | POA: Diagnosis not present

## 2015-12-23 DIAGNOSIS — E1122 Type 2 diabetes mellitus with diabetic chronic kidney disease: Secondary | ICD-10-CM | POA: Diagnosis not present

## 2015-12-23 DIAGNOSIS — F339 Major depressive disorder, recurrent, unspecified: Secondary | ICD-10-CM | POA: Diagnosis not present

## 2016-02-11 DIAGNOSIS — Z23 Encounter for immunization: Secondary | ICD-10-CM | POA: Diagnosis not present

## 2016-03-14 DIAGNOSIS — Z0000 Encounter for general adult medical examination without abnormal findings: Secondary | ICD-10-CM | POA: Diagnosis not present

## 2016-03-14 DIAGNOSIS — Z125 Encounter for screening for malignant neoplasm of prostate: Secondary | ICD-10-CM | POA: Diagnosis not present

## 2016-03-14 DIAGNOSIS — Z1389 Encounter for screening for other disorder: Secondary | ICD-10-CM | POA: Diagnosis not present

## 2016-03-14 DIAGNOSIS — Z23 Encounter for immunization: Secondary | ICD-10-CM | POA: Diagnosis not present

## 2016-03-14 DIAGNOSIS — E1122 Type 2 diabetes mellitus with diabetic chronic kidney disease: Secondary | ICD-10-CM | POA: Diagnosis not present

## 2016-03-14 DIAGNOSIS — M859 Disorder of bone density and structure, unspecified: Secondary | ICD-10-CM | POA: Diagnosis not present

## 2016-03-14 DIAGNOSIS — E785 Hyperlipidemia, unspecified: Secondary | ICD-10-CM | POA: Diagnosis not present

## 2016-03-14 DIAGNOSIS — I10 Essential (primary) hypertension: Secondary | ICD-10-CM | POA: Diagnosis not present

## 2016-03-14 DIAGNOSIS — E559 Vitamin D deficiency, unspecified: Secondary | ICD-10-CM | POA: Diagnosis not present

## 2016-03-14 DIAGNOSIS — M8580 Other specified disorders of bone density and structure, unspecified site: Secondary | ICD-10-CM | POA: Diagnosis not present

## 2016-03-14 DIAGNOSIS — I129 Hypertensive chronic kidney disease with stage 1 through stage 4 chronic kidney disease, or unspecified chronic kidney disease: Secondary | ICD-10-CM | POA: Diagnosis not present

## 2016-03-15 DIAGNOSIS — Z961 Presence of intraocular lens: Secondary | ICD-10-CM | POA: Diagnosis not present

## 2016-03-15 DIAGNOSIS — H04123 Dry eye syndrome of bilateral lacrimal glands: Secondary | ICD-10-CM | POA: Diagnosis not present

## 2016-03-15 DIAGNOSIS — H31011 Macula scars of posterior pole (postinflammatory) (post-traumatic), right eye: Secondary | ICD-10-CM | POA: Diagnosis not present

## 2016-03-15 DIAGNOSIS — H538 Other visual disturbances: Secondary | ICD-10-CM | POA: Diagnosis not present

## 2016-03-22 DIAGNOSIS — F17211 Nicotine dependence, cigarettes, in remission: Secondary | ICD-10-CM | POA: Diagnosis not present

## 2016-03-22 DIAGNOSIS — I209 Angina pectoris, unspecified: Secondary | ICD-10-CM | POA: Diagnosis not present

## 2016-03-22 DIAGNOSIS — F339 Major depressive disorder, recurrent, unspecified: Secondary | ICD-10-CM | POA: Diagnosis not present

## 2016-03-22 DIAGNOSIS — I10 Essential (primary) hypertension: Secondary | ICD-10-CM | POA: Diagnosis not present

## 2016-03-22 DIAGNOSIS — E1122 Type 2 diabetes mellitus with diabetic chronic kidney disease: Secondary | ICD-10-CM | POA: Diagnosis not present

## 2016-03-31 DIAGNOSIS — R197 Diarrhea, unspecified: Secondary | ICD-10-CM | POA: Diagnosis not present

## 2016-03-31 DIAGNOSIS — R110 Nausea: Secondary | ICD-10-CM | POA: Diagnosis not present

## 2016-04-01 DIAGNOSIS — R197 Diarrhea, unspecified: Secondary | ICD-10-CM | POA: Diagnosis not present

## 2016-04-01 DIAGNOSIS — R110 Nausea: Secondary | ICD-10-CM | POA: Diagnosis not present

## 2016-04-19 DIAGNOSIS — R110 Nausea: Secondary | ICD-10-CM | POA: Diagnosis not present

## 2016-04-22 ENCOUNTER — Other Ambulatory Visit: Payer: Self-pay

## 2016-04-22 ENCOUNTER — Other Ambulatory Visit (INDEPENDENT_AMBULATORY_CARE_PROVIDER_SITE_OTHER): Payer: Medicare Other

## 2016-04-22 ENCOUNTER — Ambulatory Visit (INDEPENDENT_AMBULATORY_CARE_PROVIDER_SITE_OTHER): Payer: Medicare Other | Admitting: Physician Assistant

## 2016-04-22 ENCOUNTER — Encounter: Payer: Self-pay | Admitting: Physician Assistant

## 2016-04-22 VITALS — BP 138/89 | HR 84 | Ht 70.0 in | Wt 217.0 lb

## 2016-04-22 DIAGNOSIS — K2930 Chronic superficial gastritis without bleeding: Secondary | ICD-10-CM | POA: Diagnosis not present

## 2016-04-22 DIAGNOSIS — K219 Gastro-esophageal reflux disease without esophagitis: Secondary | ICD-10-CM

## 2016-04-22 DIAGNOSIS — R110 Nausea: Secondary | ICD-10-CM

## 2016-04-22 LAB — H. PYLORI ANTIBODY, IGG: H PYLORI IGG: POSITIVE — AB

## 2016-04-22 LAB — LIPASE: Lipase: 17 U/L (ref 11.0–59.0)

## 2016-04-22 MED ORDER — AMOXICILLIN 500 MG PO TABS: 1000.0000 mg | ORAL_TABLET | Freq: Two times a day (BID) | ORAL | 0 refills | Status: AC

## 2016-04-22 MED ORDER — CLARITHROMYCIN 500 MG PO TABS: 500.0000 mg | ORAL_TABLET | Freq: Two times a day (BID) | ORAL | 0 refills | Status: AC

## 2016-04-22 MED ORDER — ESOMEPRAZOLE MAGNESIUM 40 MG PO CPDR: 40.0000 mg | DELAYED_RELEASE_CAPSULE | Freq: Two times a day (BID) | ORAL | 3 refills | Status: DC

## 2016-04-22 NOTE — Patient Instructions (Addendum)
Your physician has requested that you go to the basement for lab work before leaving today.   Stop Pantoprazole.   Start Esomeprazole 40 mg twice a day, 30-60 mins before bedtime.   We have given you a handout on GERD.  Please call and schedule a follow up with Dr. Ardis Hughs.

## 2016-04-22 NOTE — Progress Notes (Signed)
Chief Complaint: Nausea  HPI:  [name] is a 75 year old Caucasian male who follows with Dr. Ardis Hughs, he presents to clinic today with a chief complaint of nausea. Patient was seen in our clinic last year around February for the same complaint, full workup was done with CT abdomen pelvis, CMP, CBC, lipase and EGD. Lab testing and imaging were negative. EGD completed to 07/12/14 showed mild to moderate nonspecific pangastritis and also a small amount of retained solid and liquid food in the stomach. The Z line was slightly irregular but there were no nodules and affect unchanged from 2013 at which time biopsy showed no Barrett's. Exam was otherwise normal. Pathology revealed chronic gastritis.   Today, the patient tells me that about a month ago he started with constant nausea. He tells me this occurs throughout the day, but is sometimes and typically worse in the morning until around noon or 1. Along with this patient has been experiencing some "indigestion". He tells me he takes Maalox at these times which also helps with the nausea. Of interest the patient tells me he was diagnosed with C. difficile 3 weeks ago and started on 10 days of Flagyl 3 times a day. His nausea started before this time though. Patient does continue on his Pantoprazole 40 mg twice a day. He denies any episodes of vomiting.   Patient denies fever, chills, blood in the stool, melena, change in bowel habits, change in diet, recent change in medications, weight loss, fatigue and anorexia, vomiting, dysphagia, abdominal pain or symptoms that awaken him at night.  Past Medical History:  Diagnosis Date  . Adenomatous colon polyp   . Arthritis   . CAD (coronary artery disease)   . Clostridium difficile infection   . Depression   . Diverticulosis   . H. pylori infection   . HTN (hypertension)   . Hyperlipidemia   . MI (myocardial infarction)     Past Surgical History:  Procedure Laterality Date  . BRAIN TUMOR EXCISION  1954    benign tumor in back of head, done at  [facility]  . COLONOSCOPY     hx of polyps  . stent to the LAD  1997  . stent x2 in the LAD  with intra-aortic balloon pump support  1996  . TONSILLECTOMY    . TOTAL KNEE ARTHROPLASTY Bilateral     Current Outpatient Prescriptions  Medication Sig Dispense Refill  . aspirin 81 MG tablet Take 81 mg by mouth daily.      Marland Kitchen atorvastatin (LIPITOR) 80 MG tablet Take 80 mg by mouth daily.      Marland Kitchen FLUoxetine (PROZAC) 40 MG capsule Take 40 mg by mouth daily.    . hydrocodone-acetaminophen (LORCET-HD) 5-500 MG per capsule Take 1 capsule by mouth every 6 (six) hours as needed for pain.    . meloxicam (MOBIC) 7.5 MG tablet Take 7.5 mg by mouth 2 (two) times daily.      . metoprolol (LOPRESSOR) 50 MG tablet Take 50 mg by mouth daily.    . nitroGLYCERIN (NITROSTAT) 0.4 MG SL tablet Place 0.4 mg under the tongue every 5 (five) minutes as needed.      . pantoprazole (PROTONIX) 40 MG tablet Take 40 mg by mouth 2 (two) times daily.    . promethazine (PHENERGAN) 12.5 MG tablet Take 12.5 mg by mouth every 6 (six) hours as needed for nausea or vomiting.     No current facility-administered medications for this visit.     Allergies  as of 04/22/2016  . (No Known Allergies)    Family History  Problem Relation Age of Onset  . Heart attack Mother     44s  . Heart attack Brother 35  . Cancer Sister     unsure of type  . Colon cancer Neg Hx   . Stomach cancer Neg Hx   . Rectal cancer Neg Hx   . Esophageal cancer Neg Hx     Social History   Social History  . Marital status: Married    Spouse name: N/A  . Number of children: 1  . Years of education: N/A   Occupational History  . Retired    Social History Main Topics  . Smoking status: Former Smoker    Quit date: 06/06/1994  . Smokeless tobacco: Never Used  . Alcohol use No  . Drug use: No  . Sexual activity: Yes   Other Topics Concern  . Not on file   Social History Narrative  . No narrative on file      Review of Systems:     Constitutional: No weight loss, fever, chills, weakness or fatigue Cardiovascular: No chest pain, chest pressure or palpitations   Respiratory: No SOB or cough Gastrointestinal: See HPI and otherwise negative   Physical Exam:  Vital signs: BP 138/89   Pulse 84   Ht 5\' 10"  (1.778 m)   Wt 217 lb (98.4 kg)   BMI 31.14 kg/m   Constitutional:   Pleasant Caucasian male appears to be in NAD, Well developed, Well nourished, alert and cooperative Respiratory: Respirations even and unlabored. Lungs clear to auscultation bilaterally.   No wheezes, crackles, or rhonchi.  Cardiovascular: Normal S1, S2. No MRG. Regular rate and rhythm. No peripheral edema, cyanosis or pallor.  Gastrointestinal:  Soft, nondistended, nontender. No rebound or guarding. Normal bowel sounds. No appreciable masses or hepatomegaly. Msk:  Symmetrical without gross deformities. Without edema, no deformity or joint abnormality.   Patient reports lab from PCP recently, we do not have report.  Assessment: 1. Heartburn: Breakthrough indigestion symptoms regardless of Pantoprazole 40 mg twice a day, helped with Maalox, this also helps his nausea; consider H. pylori versus gastritis versus other 2. Nausea: As above, consider relation to heartburn/gastritis 3. History of chronic gastritis: Seen on EGD in February 2016  Plan: 1. Stop Protonix 40 mg twice a day. 2. Start Omeprazole 40 mg twice a day, 1 tablet by mouth 30-60 minutes before breakfast and dinner. #60 with 2 refills 3. The antireflux diet and lifestyle modifications with the patient. Provided with a handout 4. Patient may continue his Promethazine 12.5 mg, if he does continue with nausea and needs more medicine we can refill this and increase it to 25 mg 5. If symptoms continue over the next 2-3 weeks, would recommend that patient have further evaluation with possible imaging and/or EGD 6. Patient to return to clinic in 2-3 weeks with me  or Dr. Jerilynn Birkenhead, PA-C [facility] 04/22/2016, 1:53 PM  Cc: No ref. provider found

## 2016-04-25 ENCOUNTER — Ambulatory Visit: Payer: Medicare Other | Admitting: Gastroenterology

## 2016-04-25 NOTE — Progress Notes (Signed)
I agree with the above note, plan 

## 2016-05-04 DIAGNOSIS — F339 Major depressive disorder, recurrent, unspecified: Secondary | ICD-10-CM | POA: Diagnosis not present

## 2016-05-11 ENCOUNTER — Encounter: Payer: Self-pay | Admitting: Gastroenterology

## 2016-05-11 ENCOUNTER — Ambulatory Visit (INDEPENDENT_AMBULATORY_CARE_PROVIDER_SITE_OTHER): Payer: Medicare Other | Admitting: Gastroenterology

## 2016-05-11 VITALS — BP 130/72 | HR 60 | Ht 70.0 in | Wt 220.0 lb

## 2016-05-11 DIAGNOSIS — R110 Nausea: Secondary | ICD-10-CM

## 2016-05-11 DIAGNOSIS — K219 Gastro-esophageal reflux disease without esophagitis: Secondary | ICD-10-CM

## 2016-05-11 NOTE — Patient Instructions (Addendum)
Try cutting back on the esomeprazole to once daily.  Best taken 20-30 min before your breakfast meal. Call here in 2-3 months to report on your response. Otherwise follow up as needed.

## 2016-05-11 NOTE — Progress Notes (Signed)
Review of pertinent gastrointestinal problems: 1.nausea.  February 2016  full workup was done with CT abdomen pelvis, CMP, CBC, lipase and EGD. Lab testing and imaging were negative. EGD completed to 07/12/14 showed mild to moderate nonspecific pangastritis and also a small amount of retained solid and liquid food in the stomach. The Z line was slightly irregular but there were no nodules and affect unchanged from 2013 at which time biopsy showed no Barrett's. Pathology revealed chronic gastritis.  Recurrent nausea 03/2016: recent c. Diff +, lab test showed + h. Pylori by serology and he was treated with prev-pac.  HPI: This is a    very pleasant 75 year old man whom I last saw year and a half ago. He was here in the office and he saw Anderson Malta lemon 6 weeks ago.  Chief complaint is resolved nausea  Stopped prev-pac a few days ago.  He has really felt a lot better.  Nauseas is gone.  He is very very happy with his care here.  The diarrhea has not returned fortunately.    He takes Nexium twice daily. Not sure why he was never really started on proton pump inhibitors.  ROS: complete GI ROS as described in HPI.  Constitutional:  No unintentional weight loss   Past Medical History:  Diagnosis Date  . Adenomatous colon polyp   . Arthritis   . CAD (coronary artery disease)   . Clostridium difficile infection   . Depression   . Diverticulosis   . H. pylori infection   . HTN (hypertension)   . Hyperlipidemia   . MI (myocardial infarction)     Past Surgical History:  Procedure Laterality Date  . BRAIN TUMOR EXCISION  1954   benign tumor in back of head, done at  [facility]  . COLONOSCOPY     hx of polyps  . stent to the LAD  1997  . stent x2 in the LAD  with intra-aortic balloon pump support  1996  . TONSILLECTOMY    . TOTAL KNEE ARTHROPLASTY Bilateral     Current Outpatient Prescriptions  Medication Sig Dispense Refill  . aspirin 81 MG tablet Take 81 mg by mouth daily.      Marland Kitchen  atorvastatin (LIPITOR) 80 MG tablet Take 80 mg by mouth daily.      Marland Kitchen esomeprazole (NEXIUM) 40 MG capsule Take 1 capsule (40 mg total) by mouth 2 (two) times daily before a meal. 60 capsule 3  . FLUoxetine (PROZAC) 40 MG capsule Take 40 mg by mouth daily.    . hydrocodone-acetaminophen (LORCET-HD) 5-500 MG per capsule Take 1 capsule by mouth every 6 (six) hours as needed for pain.    . meloxicam (MOBIC) 7.5 MG tablet Take 7.5 mg by mouth 2 (two) times daily.      . metoprolol (LOPRESSOR) 50 MG tablet Take 50 mg by mouth daily.    . nitroGLYCERIN (NITROSTAT) 0.4 MG SL tablet Place 0.4 mg under the tongue every 5 (five) minutes as needed.      . promethazine (PHENERGAN) 12.5 MG tablet Take 12.5 mg by mouth every 6 (six) hours as needed for nausea or vomiting.     No current facility-administered medications for this visit.     Allergies as of 05/11/2016  . (No Known Allergies)    Family History  Problem Relation Age of Onset  . Heart attack Mother     39s  . Heart attack Brother 31  . Cancer Sister     unsure of type  .  Colon cancer Neg Hx   . Stomach cancer Neg Hx   . Rectal cancer Neg Hx   . Esophageal cancer Neg Hx     Social History   Social History  . Marital status: Married    Spouse name: N/A  . Number of children: 1  . Years of education: N/A   Occupational History  . Retired    Social History Main Topics  . Smoking status: Former Smoker    Quit date: 06/06/1994  . Smokeless tobacco: Never Used  . Alcohol use No  . Drug use: No  . Sexual activity: Yes   Other Topics Concern  . Not on file   Social History Narrative  . No narrative on file     Physical Exam: BP 130/72   Pulse 60   Ht 5\' 10"  (1.778 m)   Wt 220 lb (99.8 kg)   BMI 31.57 kg/m  Constitutional: generally well-appearing Psychiatric: alert and oriented x3 Abdomen: soft, nontender, nondistended, no obvious ascites, no peritoneal signs, normal bowel sounds No peripheral edema noted in lower  extremities  Assessment and plan: 75 y.o. male with Resolved nausea, GERD  His nausea has completely resolved after Prevpac type treatment for H. pylori serology positive. I recommended he try cutting back on his proton pump inhibitor to once daily shortly before breakfast in the morning. He will call to report on his response to this change in 2 or 3 months. Otherwise he will return to see me on an as-needed basis.   Owens Loffler, MD [facility] 05/11/2016, 8:51 AM

## 2016-06-28 ENCOUNTER — Ambulatory Visit: Payer: Medicare Other | Admitting: Gastroenterology

## 2016-07-06 IMAGING — CT CT ABD-PELV W/ CM
2 of 5 series · 16 of 46 positions shown, 18 images · IV contrast (APPLIED)
Comparison: CT abdomen pelvis of 05/24/2006

CLINICAL DATA: Mid abdominal pain, some weight loss, decreased
appetite

EXAM:
CT ABDOMEN AND PELVIS WITH CONTRAST
TECHNIQUE: Multidetector CT imaging of the abdomen and pelvis was performed
using the standard protocol following bolus administration of
intravenous contrast.
CONTRAST:  100mL OMNIPAQUE IOHEXOL 300 MG/ML  SOLN

[Series 2: abd/ pelvis 5.0 i30f 1 · axial · 0.79mm/px · z∈[+658,+1118]mm · 13 of 104 slices shown, 15 images]
[im 6/104  soft-tissue]
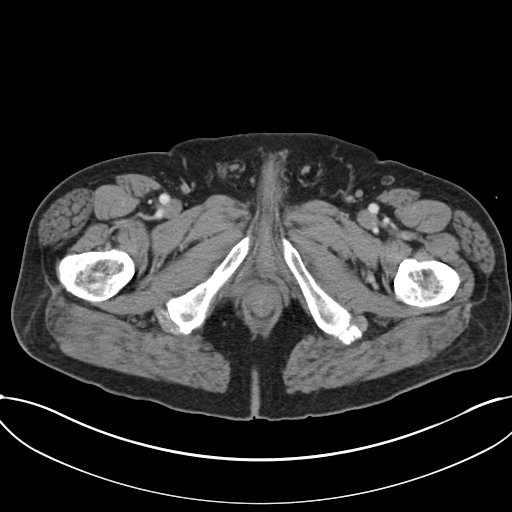
[im 6/104  bone]
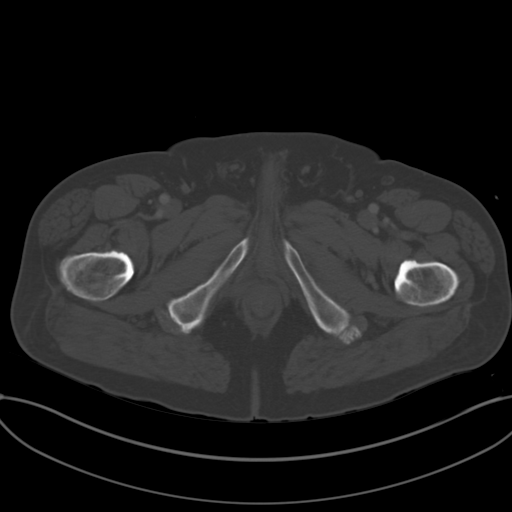
[im 17/104  soft-tissue]
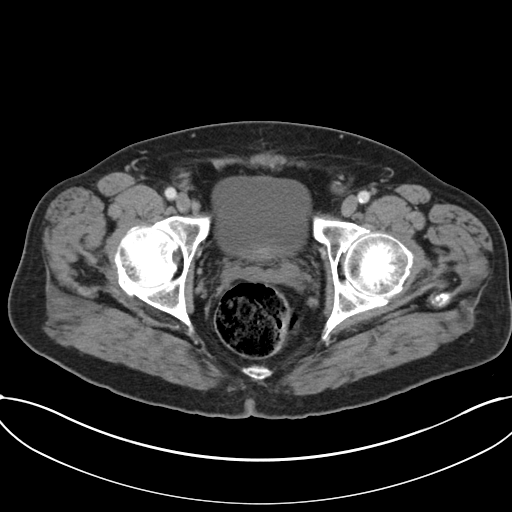
[im 22/104  soft-tissue]
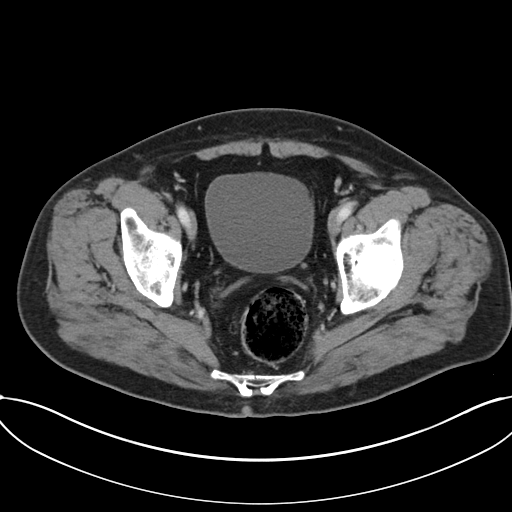
[im 28/104  soft-tissue]
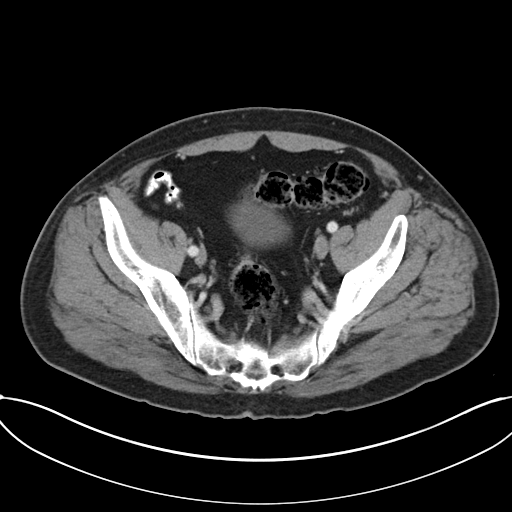
[im 38/104  soft-tissue]
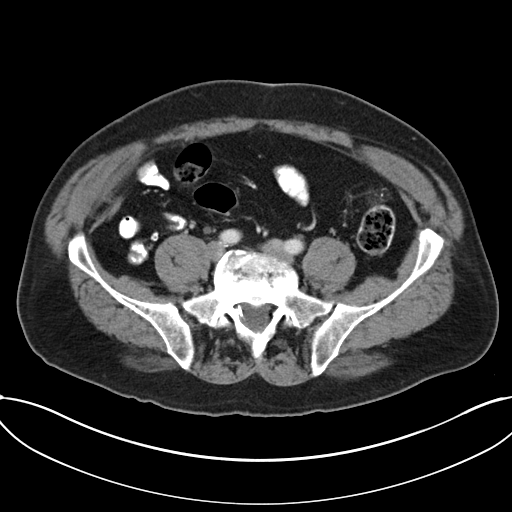
[im 44/104  soft-tissue]
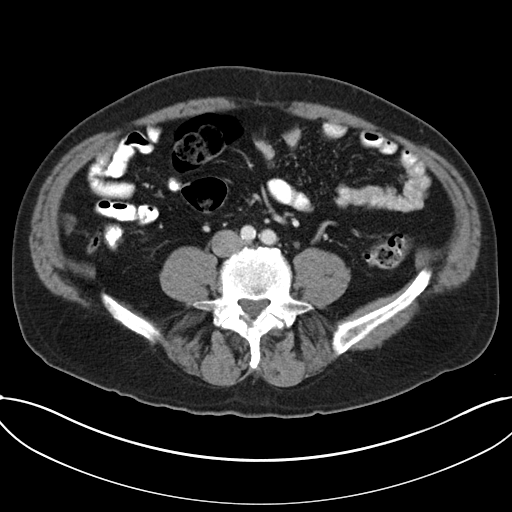
[im 55/104  soft-tissue]
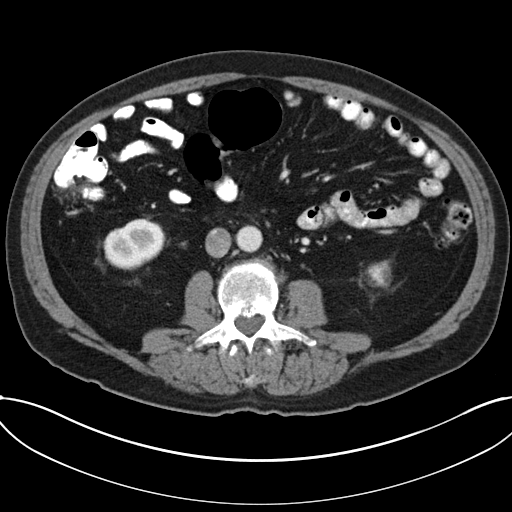
[im 60/104  soft-tissue]
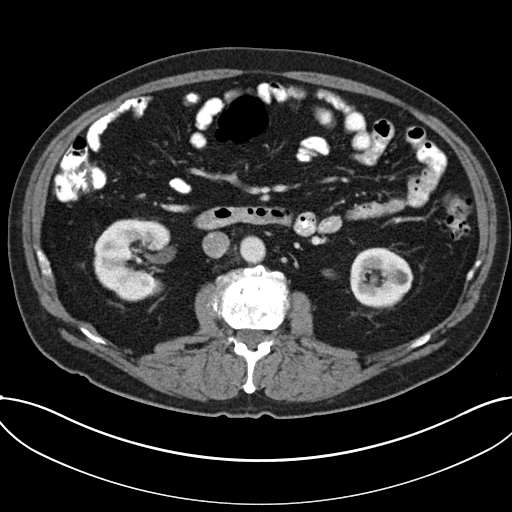
[im 66/104  soft-tissue]
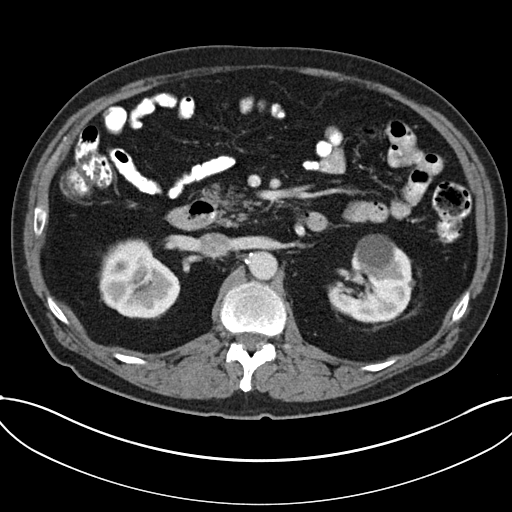
[im 66/104  bone]
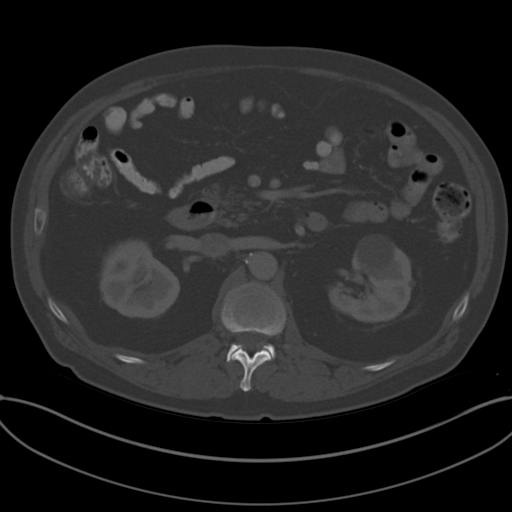
[im 76/104  soft-tissue]
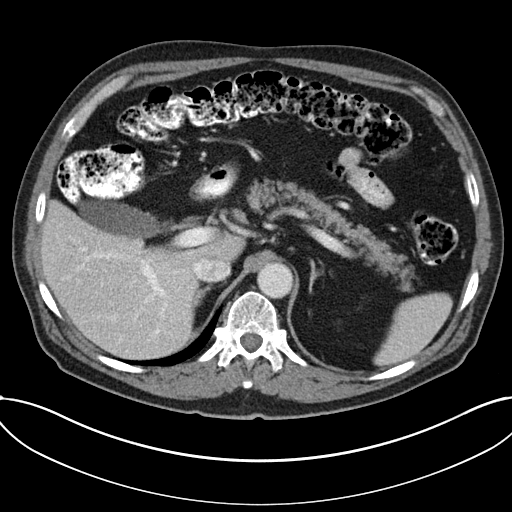
[im 82/104  soft-tissue]
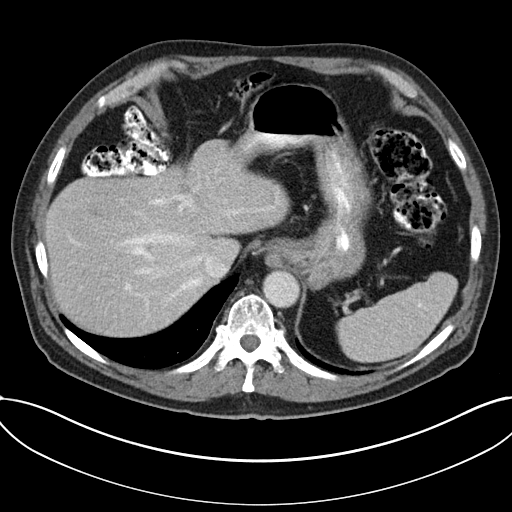
[im 87/104  soft-tissue]
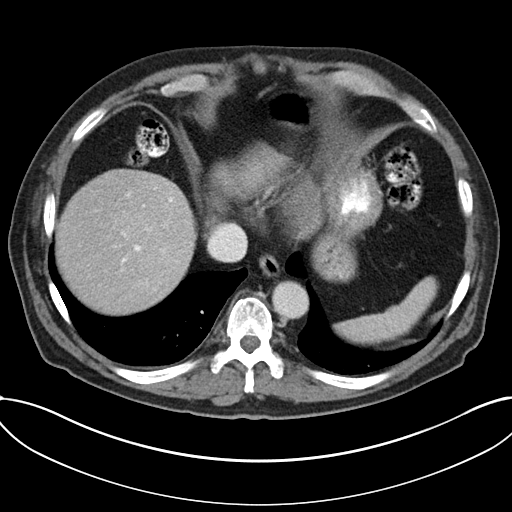
[im 98/104  soft-tissue]
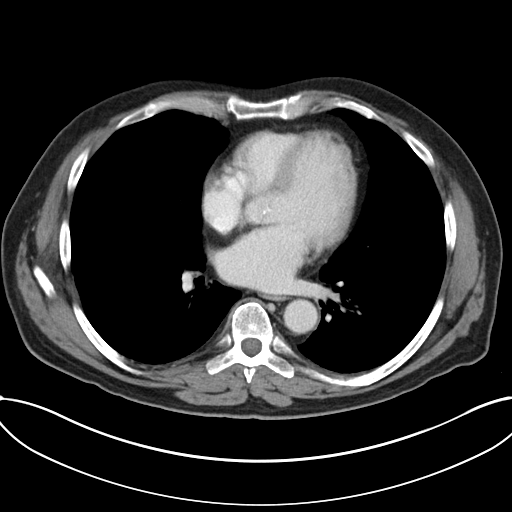

[Series 5: coronal soft tissue · coronal · 0.86mm/px · 3 of 101 slices shown]
[im 34/101  soft-tissue]
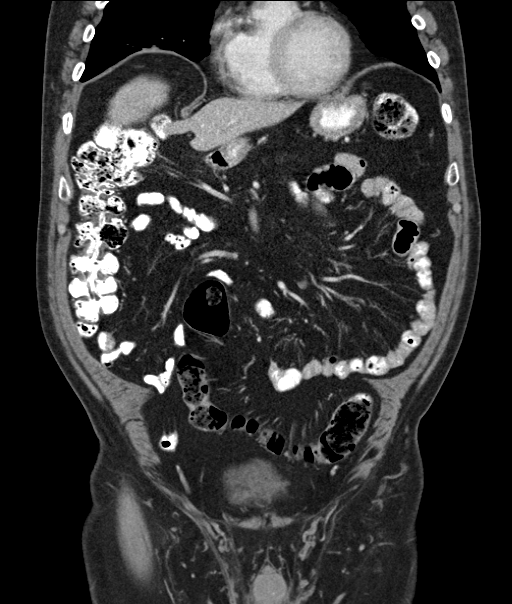
[im 45/101  soft-tissue]
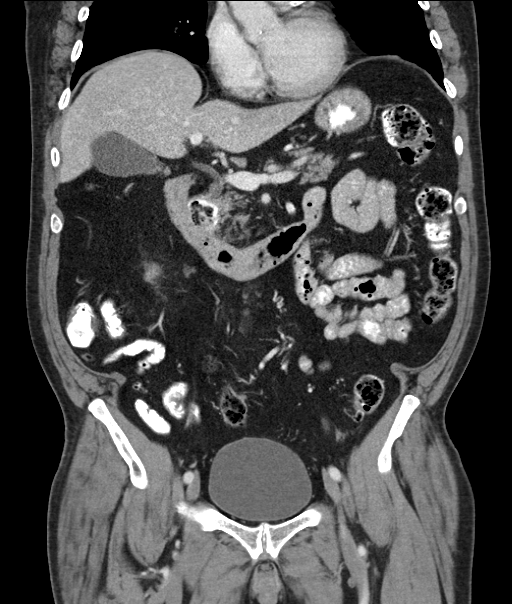
[im 56/101  soft-tissue]
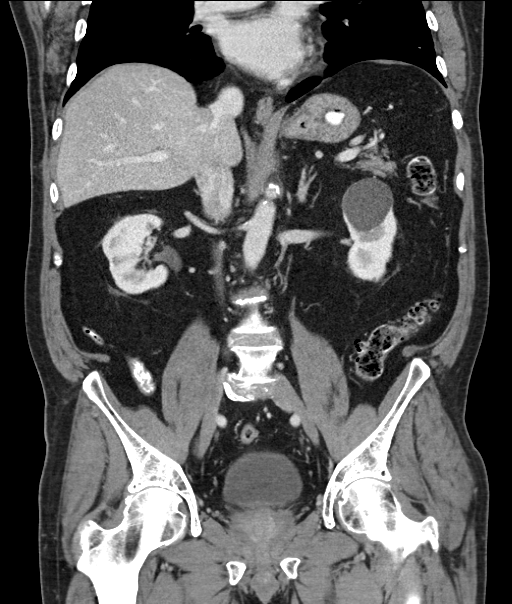

[16 of 46 positions shown; findings below may reference images not displayed]

FINDINGS: The lung bases are clear. Coronary artery calcifications are noted
in the distribution of the left anterior descending artery. The
liver enhances with no focal abnormality and no ductal dilatation is
seen. No calcified gallstones are noted. The pancreas is normal in
size and the pancreatic duct is not dilated. The adrenal glands and
spleen are unremarkable. The stomach is not well distended. A cyst
in the upper anterior left kidney has increased in size now
measuring 4.3 cm. No renal calculi are seen. On delayed images, the
pelvocaliceal systems are unremarkable. The proximal ureters are
normal in caliber. The abdominal aorta is normal in caliber with
mild atheromatous change. No adenopathy is seen.

The distal ureters are normal in caliber. The urinary bladder is
unremarkable. The prostate is slightly prominent. There are
scattered rectosigmoid and descending colon diverticula present. The
terminal ileum and the appendix appear normal. There is mild to
moderate degenerative joint disease of the hips present.
Degenerative disc disease is present L2-3 and L5-S1.
IMPRESSION: 1. No explanation for the patient's weight loss, nausea, and mid
abdominal pain is seen.
2. No renal or ureteral calculi are noted.
3. Left anterior descending coronary artery calcifications.
4. Rectosigmoid and descending colon diverticula.

## 2016-07-11 ENCOUNTER — Encounter: Payer: Self-pay | Admitting: Gastroenterology

## 2016-07-26 DIAGNOSIS — I129 Hypertensive chronic kidney disease with stage 1 through stage 4 chronic kidney disease, or unspecified chronic kidney disease: Secondary | ICD-10-CM | POA: Diagnosis not present

## 2016-08-31 DIAGNOSIS — I10 Essential (primary) hypertension: Secondary | ICD-10-CM | POA: Diagnosis not present

## 2016-08-31 DIAGNOSIS — E1122 Type 2 diabetes mellitus with diabetic chronic kidney disease: Secondary | ICD-10-CM | POA: Diagnosis not present

## 2016-09-08 DIAGNOSIS — E1122 Type 2 diabetes mellitus with diabetic chronic kidney disease: Secondary | ICD-10-CM | POA: Diagnosis not present

## 2016-09-08 DIAGNOSIS — I129 Hypertensive chronic kidney disease with stage 1 through stage 4 chronic kidney disease, or unspecified chronic kidney disease: Secondary | ICD-10-CM | POA: Diagnosis not present

## 2016-09-08 DIAGNOSIS — F339 Major depressive disorder, recurrent, unspecified: Secondary | ICD-10-CM | POA: Diagnosis not present

## 2016-09-08 DIAGNOSIS — N182 Chronic kidney disease, stage 2 (mild): Secondary | ICD-10-CM | POA: Diagnosis not present

## 2016-09-22 ENCOUNTER — Other Ambulatory Visit: Payer: Self-pay | Admitting: Emergency Medicine

## 2016-09-22 MED ORDER — ESOMEPRAZOLE MAGNESIUM 40 MG PO CPDR: 40.0000 mg | DELAYED_RELEASE_CAPSULE | Freq: Two times a day (BID) | ORAL | 3 refills | Status: DC

## 2017-01-17 ENCOUNTER — Ambulatory Visit (INDEPENDENT_AMBULATORY_CARE_PROVIDER_SITE_OTHER): Payer: Medicare Other | Admitting: Cardiovascular Disease

## 2017-01-17 ENCOUNTER — Encounter: Payer: Self-pay | Admitting: Cardiovascular Disease

## 2017-01-17 VITALS — BP 112/70 | HR 56 | Ht 70.0 in | Wt 212.4 lb

## 2017-01-17 DIAGNOSIS — R61 Generalized hyperhidrosis: Secondary | ICD-10-CM | POA: Diagnosis not present

## 2017-01-17 DIAGNOSIS — R0600 Dyspnea, unspecified: Secondary | ICD-10-CM | POA: Diagnosis not present

## 2017-01-17 DIAGNOSIS — R5383 Other fatigue: Secondary | ICD-10-CM | POA: Diagnosis not present

## 2017-01-17 DIAGNOSIS — I2510 Atherosclerotic heart disease of native coronary artery without angina pectoris: Secondary | ICD-10-CM | POA: Diagnosis not present

## 2017-01-17 DIAGNOSIS — M549 Dorsalgia, unspecified: Secondary | ICD-10-CM | POA: Diagnosis not present

## 2017-01-17 DIAGNOSIS — I25110 Atherosclerotic heart disease of native coronary artery with unstable angina pectoris: Secondary | ICD-10-CM | POA: Diagnosis not present

## 2017-01-17 DIAGNOSIS — R0789 Other chest pain: Secondary | ICD-10-CM | POA: Diagnosis not present

## 2017-01-17 MED ORDER — NITROGLYCERIN 0.4 MG SL SUBL: 0.4000 mg | SUBLINGUAL_TABLET | SUBLINGUAL | 6 refills | Status: DC | PRN

## 2017-01-17 NOTE — Progress Notes (Signed)
Cardiology Office Note:    Date:  01/17/2017   ID:  [name], DOB 01/26/1941, MRN [id number]  PCP:  Roger Shelter, MD (Inactive)  Cardiologist:  [name], MD    Referring MD: Vassie Moment, NP   Chief Complaint  Patient presents with  . Coronary Artery Disease    History of Present Illness:    [name] is a 76 y.o. male with a hx of Coronary artery disease been years ago. He is a previous patient of Dr. Bing Quarry. He presents now with symptoms of exertional chest pain and shortness breath.  He has shortness of breath at night or during the day  Walking 5-6 minutes causes significant dyspnea.  Also has claudication in his legs with walking   Has occasional chest pain  - usually when he is at rest. Has chest squeezing. - occurs randomly  Has lots of indigestion - occurs randomly   Also has lots of sweating - not necessarliy related to his chest tightness  does not feel similar to his previous MI pain .   No CP since that time   Hx of MI in 1996 -  Had stent by [name] Stopped smoking at that time   Does not exercise regularly    .   Works out in the yard.    Past Medical History:  Diagnosis Date  . Adenomatous colon polyp   . Arthritis   . CAD (coronary artery disease)   . Clostridium difficile infection   . Depression   . Diverticulosis   . H. pylori infection   . HTN (hypertension)   . Hyperlipidemia   . MI (myocardial infarction) [facility])     Past Surgical History:  Procedure Laterality Date  . BRAIN TUMOR EXCISION  1954   benign tumor in back of head, done at  [facility]  . COLONOSCOPY     hx of polyps  . stent to the LAD  1997  . stent x2 in the LAD  with intra-aortic balloon pump support  1996  . TONSILLECTOMY    . TOTAL KNEE ARTHROPLASTY Bilateral     Current Medications: Current Meds  Medication Sig  . aspirin 81 MG tablet Take 81 mg by mouth daily.    Marland Kitchen atorvastatin (LIPITOR) 80 MG tablet Take 80 mg by mouth daily.    Marland Kitchen  esomeprazole (NEXIUM) 40 MG capsule Take 1 capsule (40 mg total) by mouth 2 (two) times daily before a meal.  . FLUoxetine (PROZAC) 40 MG capsule Take 40 mg by mouth daily.  . hydrocodone-acetaminophen (LORCET-HD) 5-500 MG per capsule Take 1 capsule by mouth every 6 (six) hours as needed for pain.  . meloxicam (MOBIC) 7.5 MG tablet Take 7.5 mg by mouth 2 (two) times daily.    . metoprolol (LOPRESSOR) 50 MG tablet Take 50 mg by mouth daily.  . nitroGLYCERIN (NITROSTAT) 0.4 MG SL tablet Place 0.4 mg under the tongue every 5 (five) minutes as needed.    . promethazine (PHENERGAN) 12.5 MG tablet Take 12.5 mg by mouth every 6 (six) hours as needed for nausea or vomiting.     Allergies:   Patient has no known allergies.   Social History   Social History  . Marital status: Married    Spouse name: N/A  . Number of children: 1  . Years of education: N/A   Occupational History  . Retired    Social History Main Topics  . Smoking status: Former Smoker  Quit date: 06/06/1994  . Smokeless tobacco: Never Used  . Alcohol use No  . Drug use: No  . Sexual activity: Yes   Other Topics Concern  . None   Social History Narrative  . None     Family History: The patient's family history includes Cancer in his sister; Heart attack in his mother; Heart attack (age of onset: 46) in his brother. There is no history of Colon cancer, Stomach cancer, Rectal cancer, or Esophageal cancer. ROS:   Please see the history of present illness.     All other systems reviewed and are negative.  EKGs/Labs/Other Studies Reviewed:    The following studies were reviewed today: Records from [place] , [place]   EKG:  EKG is  ordered today.  The ekg ordered today demonstrates  Sinus brady at 56.   NS ST  Abn.  Incomplete  LBBB . The ST depression in V6 has improved since this am    Recent Labs: No results found for requested labs within last 8760 hours.  Recent Lipid Panel    Component Value Date/Time    CHOL 181 06/23/2009 1147   TRIG 117.0 06/23/2009 1147   HDL 41.40 06/23/2009 1147   CHOLHDL 4 06/23/2009 1147   VLDL 23.4 06/23/2009 1147   LDLCALC 116 (H) 06/23/2009 1147   LDLDIRECT 159.1 12/10/2007 0851    Physical Exam:    VS:  BP 112/70   Pulse (!) 56   Ht 5\' 10"  (1.778 m)   Wt 212 lb 6.4 oz (96.3 kg)   BMI 30.48 kg/m     Wt Readings from Last 3 Encounters:  01/17/17 212 lb 6.4 oz (96.3 kg)  05/11/16 220 lb (99.8 kg)  04/22/16 217 lb (98.4 kg)     GEN:  Well nourished, well developed in no acute distress HEENT: Normal NECK: No JVD; No carotid bruits LYMPHATICS: No lymphadenopathy CARDIAC: RRR, no murmurs, rubs, gallops RESPIRATORY:  Clear to auscultation without rales, wheezing or rhonchi  ABDOMEN: Soft, non-tender, non-distended MUSCULOSKELETAL:  No edema; No deformity  SKIN: Warm and dry NEUROLOGIC:  Alert and oriented x 3 PSYCHIATRIC:  Normal affect   ASSESSMENT:    No diagnosis found. PLAN:    In order of problems listed above:  1. Unstable angina:  [place] presents with symptoms that are worrisome for unstable angina. He has a history of coronary artery disease and stenting in the past. He has nonspecific ST changes. Of note, he had ST depression this morning that has now resolved.  We will schedule him for a heart catheterization for tomorrow. We will refill his nitroglycerin. We instructed him to go to the emergency if he has any progressive chest discomfort that does not resolve. He's currently pain-free and I do not think that he needs to go to the emergency room at this time.    Medication Adjustments/Labs and Tests Ordered: Current medicines are reviewed at length with the patient today.  Concerns regarding medicines are outlined above.  No orders of the defined types were placed in this encounter.  No orders of the defined types were placed in this encounter.   Signed, [name], MD  01/17/2017 1:59 PM    [place]

## 2017-01-17 NOTE — Patient Instructions (Addendum)
Medication Instructions:  Your physician recommends that you continue on your current medications as directed. Please refer to the Current Medication list given to you today.   Labwork: TODAY - Troponin, CBC, BMET, PT/INR   Testing/Procedures:   [place] OFFICE [address] [place] [address] Dept: [phone] Loc: [phone]  [name]  01/17/2017  You are scheduled for a Cardiac Catheterization on Wednesday, August 15 with Dr. Shelva Majestic.  1. Please arrive at the [facility] (Main Entrance A) at [facility]: [address] [place], [place] [address] at 11:00 AM (two hours before your procedure to ensure your preparation). Free valet parking service is available.   Special note: Every effort is made to have your procedure done on time. Please understand that emergencies sometimes delay scheduled procedures.  2. Diet: Do not eat or drink anything after midnight prior to your procedure except sips of water to take medications.  3. Labs: You will need to have blood drawn on Tuesday, August 14 at [facility] at [facility]. [address]  Open: 7:30am - 5pm    Phone: [phone]. You do not need to be fasting.  4. Medication instructions in preparation for your procedure:   On the morning of your procedure, take your Aspirin and any morning medicines NOT listed above.  You may use sips of water.  5. Plan for one night stay--bring personal belongings. 6. Bring a current list of your medications and current insurance cards. 7. You MUST have a responsible person to drive you home. 8. Someone MUST be with you the first 24 hours after you arrive home or your discharge will be delayed. 9. Please wear clothes that are easy to get on and off and wear slip-on shoes.  Thank you for allowing [place] to care for you!   -- [facility]   Follow-Up: Your physician recommends that you schedule a follow-up appointment in: 3 months with Dr. Acie Fredrickson.    If you need a refill on your cardiac medications before your next appointment, please call your pharmacy.   Thank you for choosing [facility]! Christen Bame, RN [phone]

## 2017-01-18 ENCOUNTER — Ambulatory Visit (HOSPITAL_COMMUNITY)
Admission: RE | Admit: 2017-01-18 | Discharge: 2017-01-19 | Disposition: A | Discharge status: Home / Self Care | Payer: Medicare Other | Source: Ambulatory Visit | Attending: Cardiovascular Disease | Admitting: Cardiovascular Disease

## 2017-01-18 ENCOUNTER — Encounter (HOSPITAL_COMMUNITY): Payer: Self-pay | Admitting: General Practice

## 2017-01-18 ENCOUNTER — Encounter (HOSPITAL_COMMUNITY)
Admission: RE | Disposition: A | Discharge status: Home / Self Care | Payer: Self-pay | Source: Ambulatory Visit | Attending: Cardiovascular Disease

## 2017-01-18 DIAGNOSIS — I5043 Acute on chronic combined systolic (congestive) and diastolic (congestive) heart failure: Secondary | ICD-10-CM | POA: Diagnosis not present

## 2017-01-18 DIAGNOSIS — I252 Old myocardial infarction: Secondary | ICD-10-CM | POA: Insufficient documentation

## 2017-01-18 DIAGNOSIS — Z955 Presence of coronary angioplasty implant and graft: Secondary | ICD-10-CM | POA: Diagnosis not present

## 2017-01-18 DIAGNOSIS — I10 Essential (primary) hypertension: Secondary | ICD-10-CM | POA: Diagnosis present

## 2017-01-18 DIAGNOSIS — I25110 Atherosclerotic heart disease of native coronary artery with unstable angina pectoris: Secondary | ICD-10-CM | POA: Insufficient documentation

## 2017-01-18 DIAGNOSIS — Z87891 Personal history of nicotine dependence: Secondary | ICD-10-CM | POA: Diagnosis not present

## 2017-01-18 DIAGNOSIS — I110 Hypertensive heart disease with heart failure: Secondary | ICD-10-CM | POA: Insufficient documentation

## 2017-01-18 DIAGNOSIS — Y838 Other surgical procedures as the cause of abnormal reaction of the patient, or of later complication, without mention of misadventure at the time of the procedure: Secondary | ICD-10-CM | POA: Insufficient documentation

## 2017-01-18 DIAGNOSIS — I200 Unstable angina: Secondary | ICD-10-CM | POA: Insufficient documentation

## 2017-01-18 DIAGNOSIS — T82855A Stenosis of coronary artery stent, initial encounter: Secondary | ICD-10-CM | POA: Diagnosis not present

## 2017-01-18 HISTORY — PX: CORONARY STENT INTERVENTION: CATH118234

## 2017-01-18 HISTORY — PX: LEFT HEART CATH AND CORONARY ANGIOGRAPHY: CATH118249

## 2017-01-18 LAB — BASIC METABOLIC PANEL
BUN/Creatinine Ratio: 23 (ref 10–24)
BUN: 26 mg/dL (ref 8–27)
CO2: 27 mmol/L (ref 20–29)
CREATININE: 1.14 mg/dL (ref 0.76–1.27)
Calcium: 9.4 mg/dL (ref 8.6–10.2)
Chloride: 103 mmol/L (ref 96–106)
GFR calc Af Amer: 73 mL/min/{1.73_m2} (ref >59)
GFR calc non Af Amer: 63 mL/min/{1.73_m2} (ref >59)
GLUCOSE: 89 mg/dL (ref 65–99)
Potassium: 4.6 mmol/L (ref 3.5–5.2)
SODIUM: 143 mmol/L (ref 134–144)

## 2017-01-18 LAB — PROTIME-INR
INR: 1 (ref 0.8–1.2)
Prothrombin Time: 10.4 s (ref 9.1–12.0)

## 2017-01-18 LAB — TROPONIN T

## 2017-01-18 LAB — CBC
Hematocrit: 42.3 % (ref 37.5–51.0)
Hemoglobin: 13.9 g/dL (ref 13.0–17.7)
MCH: 30.5 pg (ref 26.6–33.0)
MCHC: 32.9 g/dL (ref 31.5–35.7)
MCV: 93 fL (ref 79–97)
Platelets: 228 10*3/uL (ref 150–379)
RBC: 4.56 x10E6/uL (ref 4.14–5.80)
RDW: 14.9 % (ref 12.3–15.4)
WBC: 6.2 10*3/uL (ref 3.4–10.8)

## 2017-01-18 LAB — POCT ACTIVATED CLOTTING TIME: ACTIVATED CLOTTING TIME: 422 s

## 2017-01-18 SURGERY — LEFT HEART CATH AND CORONARY ANGIOGRAPHY
Anesthesia: LOCAL

## 2017-01-18 MED ORDER — SODIUM CHLORIDE 0.9 % WEIGHT BASED INFUSION
3.0000 mL/kg/h | INTRAVENOUS | Status: DC
  Administered 2017-01-18: 3 mL/kg/h via INTRAVENOUS

## 2017-01-18 MED ORDER — ANGIOPLASTY BOOK
Freq: Once | Status: AC
  Administered 2017-01-18: 21:00:00
  Filled 2017-01-18: qty 1

## 2017-01-18 MED ORDER — ONDANSETRON HCL 4 MG/2ML IJ SOLN: 4.0000 mg | Freq: Four times a day (QID) | INTRAMUSCULAR | Status: DC | PRN

## 2017-01-18 MED ORDER — BIVALIRUDIN BOLUS VIA INFUSION - CUPID
INTRAVENOUS | Status: DC | PRN
  Administered 2017-01-18: 72.15 mg via INTRAVENOUS

## 2017-01-18 MED ORDER — HEPARIN (PORCINE) IN NACL 2-0.9 UNIT/ML-% IJ SOLN
INTRAMUSCULAR | Status: AC | PRN
Start: 2017-01-18 — End: 2017-01-18
  Administered 2017-01-18: 1000 mL via INTRA_ARTERIAL

## 2017-01-18 MED ORDER — ACETAMINOPHEN 325 MG PO TABS: 650.0000 mg | ORAL_TABLET | ORAL | Status: DC | PRN

## 2017-01-18 MED ORDER — LIDOCAINE HCL (PF) 1 % IJ SOLN
INTRAMUSCULAR | Status: AC
  Filled 2017-01-18: qty 30

## 2017-01-18 MED ORDER — MIDAZOLAM HCL 2 MG/2ML IJ SOLN
INTRAMUSCULAR | Status: DC | PRN
  Administered 2017-01-18: 2 mg via INTRAVENOUS
  Administered 2017-01-18: 1 mg via INTRAVENOUS

## 2017-01-18 MED ORDER — SODIUM CHLORIDE 0.9 % IV SOLN: 250.0000 mL | INTRAVENOUS | Status: DC | PRN

## 2017-01-18 MED ORDER — ACTIVE PARTNERSHIP FOR HEALTH OF YOUR HEART BOOK
Freq: Once | Status: AC
  Administered 2017-01-18: 21:00:00
  Filled 2017-01-18: qty 1

## 2017-01-18 MED ORDER — LABETALOL HCL 5 MG/ML IV SOLN: 10.0000 mg | INTRAVENOUS | Status: AC | PRN

## 2017-01-18 MED ORDER — SODIUM CHLORIDE 0.9 % WEIGHT BASED INFUSION: 1.0000 mL/kg/h | INTRAVENOUS | Status: DC

## 2017-01-18 MED ORDER — SODIUM CHLORIDE 0.9% FLUSH: 3.0000 mL | INTRAVENOUS | Status: DC | PRN

## 2017-01-18 MED ORDER — TICAGRELOR 90 MG PO TABS
ORAL_TABLET | ORAL | Status: AC
  Filled 2017-01-18: qty 2

## 2017-01-18 MED ORDER — HEPARIN SODIUM (PORCINE) 1000 UNIT/ML IJ SOLN
INTRAMUSCULAR | Status: DC | PRN
  Administered 2017-01-18: 5000 [IU] via INTRAVENOUS

## 2017-01-18 MED ORDER — ASPIRIN 81 MG PO CHEW
81.0000 mg | CHEWABLE_TABLET | ORAL | Status: AC
  Administered 2017-01-18: 81 mg via ORAL

## 2017-01-18 MED ORDER — HYDRALAZINE HCL 20 MG/ML IJ SOLN: 5.0000 mg | INTRAMUSCULAR | Status: AC | PRN

## 2017-01-18 MED ORDER — SODIUM CHLORIDE 0.9 % IV SOLN
INTRAVENOUS | Status: DC
  Administered 2017-01-18: 18:00:00 via INTRAVENOUS

## 2017-01-18 MED ORDER — NITROGLYCERIN 1 MG/10 ML FOR IR/CATH LAB
INTRA_ARTERIAL | Status: DC | PRN
  Administered 2017-01-18 (×3): 200 ug via INTRACORONARY

## 2017-01-18 MED ORDER — HEPARIN (PORCINE) IN NACL 2-0.9 UNIT/ML-% IJ SOLN
INTRAMUSCULAR | Status: AC
  Filled 2017-01-18: qty 1000

## 2017-01-18 MED ORDER — VERAPAMIL HCL 2.5 MG/ML IV SOLN
INTRAVENOUS | Status: DC | PRN
  Administered 2017-01-18: 15:00:00 via INTRA_ARTERIAL

## 2017-01-18 MED ORDER — METOPROLOL TARTRATE 25 MG PO TABS
25.0000 mg | ORAL_TABLET | Freq: Two times a day (BID) | ORAL | Status: DC
  Administered 2017-01-18: 22:00:00 25 mg via ORAL
  Filled 2017-01-18 (×2): qty 1

## 2017-01-18 MED ORDER — ATORVASTATIN CALCIUM 80 MG PO TABS
80.0000 mg | ORAL_TABLET | Freq: Every day | ORAL | Status: DC
  Administered 2017-01-18: 19:00:00 80 mg via ORAL
  Filled 2017-01-18: qty 1

## 2017-01-18 MED ORDER — ZOLPIDEM TARTRATE 5 MG PO TABS: 5.0000 mg | ORAL_TABLET | Freq: Every evening | ORAL | Status: DC | PRN

## 2017-01-18 MED ORDER — IOPAMIDOL (ISOVUE-370) INJECTION 76%
INTRAVENOUS | Status: AC
  Filled 2017-01-18: qty 100

## 2017-01-18 MED ORDER — MIDAZOLAM HCL 2 MG/2ML IJ SOLN
INTRAMUSCULAR | Status: AC
  Filled 2017-01-18: qty 2

## 2017-01-18 MED ORDER — TICAGRELOR 90 MG PO TABS
90.0000 mg | ORAL_TABLET | Freq: Two times a day (BID) | ORAL | Status: DC
Start: 2017-01-18 — End: 2017-01-19
  Administered 2017-01-19 (×2): 90 mg via ORAL
  Filled 2017-01-18 (×2): qty 1

## 2017-01-18 MED ORDER — ASPIRIN 81 MG PO CHEW
CHEWABLE_TABLET | ORAL | Status: AC
  Filled 2017-01-18: qty 1

## 2017-01-18 MED ORDER — HEPARIN SODIUM (PORCINE) 1000 UNIT/ML IJ SOLN
INTRAMUSCULAR | Status: AC
  Filled 2017-01-18: qty 1

## 2017-01-18 MED ORDER — IOPAMIDOL (ISOVUE-370) INJECTION 76%
INTRAVENOUS | Status: DC | PRN
  Administered 2017-01-18: 135 mL via INTRA_ARTERIAL

## 2017-01-18 MED ORDER — FENTANYL CITRATE (PF) 100 MCG/2ML IJ SOLN
INTRAMUSCULAR | Status: DC | PRN
  Administered 2017-01-18: 50 ug via INTRAVENOUS
  Administered 2017-01-18: 25 ug via INTRAVENOUS

## 2017-01-18 MED ORDER — BIVALIRUDIN TRIFLUOROACETATE 250 MG IV SOLR
INTRAVENOUS | Status: AC
  Filled 2017-01-18: qty 250

## 2017-01-18 MED ORDER — SODIUM CHLORIDE 0.9% FLUSH
3.0000 mL | Freq: Two times a day (BID) | INTRAVENOUS | Status: DC
  Administered 2017-01-18 – 2017-01-19 (×2): 3 mL via INTRAVENOUS

## 2017-01-18 MED ORDER — ASPIRIN 81 MG PO CHEW
81.0000 mg | CHEWABLE_TABLET | Freq: Every day | ORAL | Status: DC
  Administered 2017-01-19: 11:00:00 81 mg via ORAL
  Filled 2017-01-18: qty 1

## 2017-01-18 MED ORDER — VERAPAMIL HCL 2.5 MG/ML IV SOLN
INTRAVENOUS | Status: AC
  Filled 2017-01-18: qty 2

## 2017-01-18 MED ORDER — SODIUM CHLORIDE 0.9% FLUSH: 3.0000 mL | Freq: Two times a day (BID) | INTRAVENOUS | Status: DC

## 2017-01-18 MED ORDER — ISOSORBIDE MONONITRATE ER 30 MG PO TB24
30.0000 mg | ORAL_TABLET | Freq: Every day | ORAL | Status: DC
  Administered 2017-01-18 – 2017-01-19 (×2): 30 mg via ORAL
  Filled 2017-01-18 (×2): qty 1

## 2017-01-18 MED ORDER — NITROGLYCERIN 1 MG/10 ML FOR IR/CATH LAB
INTRA_ARTERIAL | Status: AC
  Filled 2017-01-18: qty 10

## 2017-01-18 MED ORDER — DIAZEPAM 5 MG PO TABS: 5.0000 mg | ORAL_TABLET | Freq: Four times a day (QID) | ORAL | Status: DC | PRN

## 2017-01-18 MED ORDER — LIDOCAINE HCL (PF) 1 % IJ SOLN
INTRAMUSCULAR | Status: DC | PRN
  Administered 2017-01-18: 2 mL

## 2017-01-18 MED ORDER — FENTANYL CITRATE (PF) 100 MCG/2ML IJ SOLN
INTRAMUSCULAR | Status: AC
  Filled 2017-01-18: qty 2

## 2017-01-18 MED ORDER — TICAGRELOR 90 MG PO TABS
ORAL_TABLET | ORAL | Status: DC | PRN
  Administered 2017-01-18: 180 mg via ORAL

## 2017-01-18 MED ORDER — SODIUM CHLORIDE 0.9 % IV SOLN
INTRAVENOUS | Status: AC | PRN
  Administered 2017-01-18: 1.75 mg/kg/h via INTRAVENOUS

## 2017-01-18 SURGICAL SUPPLY — 18 items
BALLN SAPPHIRE ~~LOC~~ 3.25X18 (BALLOONS) ×2 IMPLANT
BALLN WOLVERINE 3.00X15 (BALLOONS) ×2
BALLOON WOLVERINE 3.00X15 (BALLOONS) ×1 IMPLANT
CATH INFINITI 5FR ANG PIGTAIL (CATHETERS) ×2 IMPLANT
CATH OPTITORQUE TIG 4.5 5F (CATHETERS) ×2 IMPLANT
CATH VISTA GUIDE 6FR XBLAD3.5 (CATHETERS) ×2 IMPLANT
DEVICE RAD COMP TR BAND LRG (VASCULAR PRODUCTS) ×2 IMPLANT
GLIDESHEATH SLEND SS 6F .021 (SHEATH) ×2 IMPLANT
GUIDEWIRE INQWIRE 1.5J.035X260 (WIRE) ×1 IMPLANT
INQWIRE 1.5J .035X260CM (WIRE) ×2
KIT ENCORE 26 ADVANTAGE (KITS) ×2 IMPLANT
KIT HEART LEFT (KITS) ×2 IMPLANT
PACK CARDIAC CATHETERIZATION (CUSTOM PROCEDURE TRAY) ×2 IMPLANT
STENT RESOLUTE ONYX 3.0X26 (Permanent Stent) ×2 IMPLANT
SYR MEDRAD MARK V 150ML (SYRINGE) ×2 IMPLANT
TRANSDUCER W/STOPCOCK (MISCELLANEOUS) ×2 IMPLANT
TUBING CIL FLEX 10 FLL-RA (TUBING) ×2 IMPLANT
WIRE COUGAR XT STRL 190CM (WIRE) ×2 IMPLANT

## 2017-01-18 NOTE — Interval H&P Note (Signed)
Cath Lab Visit (complete for each Cath Lab visit)  Clinical Evaluation Leading to the Procedure:   ACS: No.  Non-ACS:    Anginal Classification: CCS III  Anti-ischemic medical therapy: Minimal Therapy (1 class of medications)  Non-Invasive Test Results: No non-invasive testing performed  Prior CABG: No previous CABG      History and Physical Interval Note:  01/18/2017 2:31 PM  [name]  has presented today for surgery, with the diagnosis of unstable angina  The various methods of treatment have been discussed with the patient and family. After consideration of risks, benefits and other options for treatment, the patient has consented to  Procedure(s): LEFT HEART CATH AND CORONARY ANGIOGRAPHY (N/A) as a surgical intervention .  The patient's history has been reviewed, patient examined, no change in status, stable for surgery.  I have reviewed the patient's chart and labs.  Questions were answered to the patient's satisfaction.     Shelva Majestic

## 2017-01-18 NOTE — Care Management Note (Signed)
Case Management Note  Patient Details  Name: CRAVEN CREAN MRN: [id number] Date of Birth: Feb 22, 1941  Subjective/Objective:   Form home, s/p coronary stent intervention, will be on brilinta. NCM awaiting benefit check.                 Action/Plan: NCM will follow for dc needs.   Expected Discharge Date:                  Expected Discharge Plan:  Home/Self Care  In-House Referral:     Discharge planning Services  CM Consult  Post Acute Care Choice:    Choice offered to:     DME Arranged:    DME Agency:     HH Arranged:    HH Agency:     Status of Service:  In process, will continue to follow  If discussed at Long Length of Stay Meetings, dates discussed:    Additional Comments:  Zenon Mayo, RN 01/18/2017, 6:12 PM

## 2017-01-18 NOTE — Progress Notes (Signed)
TR BAND REMOVAL  LOCATION:    right radial  DEFLATED PER PROTOCOL:    Yes.    TIME BAND OFF / DRESSING APPLIED:    20:30   SITE UPON ARRIVAL:    Level 0  SITE AFTER BAND REMOVAL:    Level 0  CIRCULATION SENSATION AND MOVEMENT:    Within Normal Limits   Yes.    COMMENTS:   Post TR band instructions given. Pt tolerated well. 

## 2017-01-19 ENCOUNTER — Encounter (HOSPITAL_COMMUNITY): Payer: Self-pay | Admitting: Cardiovascular Disease

## 2017-01-19 ENCOUNTER — Other Ambulatory Visit: Payer: Self-pay | Admitting: Emergency Medicine

## 2017-01-19 DIAGNOSIS — Z955 Presence of coronary angioplasty implant and graft: Secondary | ICD-10-CM

## 2017-01-19 DIAGNOSIS — I5043 Acute on chronic combined systolic (congestive) and diastolic (congestive) heart failure: Secondary | ICD-10-CM

## 2017-01-19 DIAGNOSIS — I110 Hypertensive heart disease with heart failure: Secondary | ICD-10-CM | POA: Diagnosis not present

## 2017-01-19 DIAGNOSIS — I5021 Acute systolic (congestive) heart failure: Secondary | ICD-10-CM

## 2017-01-19 DIAGNOSIS — I200 Unstable angina: Secondary | ICD-10-CM | POA: Diagnosis not present

## 2017-01-19 DIAGNOSIS — I25110 Atherosclerotic heart disease of native coronary artery with unstable angina pectoris: Secondary | ICD-10-CM | POA: Diagnosis not present

## 2017-01-19 DIAGNOSIS — I252 Old myocardial infarction: Secondary | ICD-10-CM | POA: Diagnosis not present

## 2017-01-19 DIAGNOSIS — T82855A Stenosis of coronary artery stent, initial encounter: Secondary | ICD-10-CM | POA: Diagnosis not present

## 2017-01-19 DIAGNOSIS — Z87891 Personal history of nicotine dependence: Secondary | ICD-10-CM | POA: Diagnosis not present

## 2017-01-19 LAB — CBC
HCT: 36.3 % — ABNORMAL LOW (ref 39.0–52.0)
HEMOGLOBIN: 11.7 g/dL — AB (ref 13.0–17.0)
MCH: 29.7 pg (ref 26.0–34.0)
MCHC: 32.2 g/dL (ref 30.0–36.0)
MCV: 92.1 fL (ref 78.0–100.0)
Platelets: 177 10*3/uL (ref 150–400)
RBC: 3.94 MIL/uL — ABNORMAL LOW (ref 4.22–5.81)
RDW: 14.8 % (ref 11.5–15.5)
WBC: 6.1 10*3/uL (ref 4.0–10.5)

## 2017-01-19 LAB — BASIC METABOLIC PANEL
ANION GAP: 4 — AB (ref 5–15)
BUN: 18 mg/dL (ref 6–20)
CALCIUM: 8.2 mg/dL — AB (ref 8.9–10.3)
CO2: 28 mmol/L (ref 22–32)
CREATININE: 1.09 mg/dL (ref 0.61–1.24)
Chloride: 107 mmol/L (ref 101–111)
GFR calc Af Amer: >60 mL/min (ref >60)
GLUCOSE: 94 mg/dL (ref 65–99)
Potassium: 3.8 mmol/L (ref 3.5–5.1)
Sodium: 139 mmol/L (ref 135–145)

## 2017-01-19 MED ORDER — NITROGLYCERIN 0.4 MG SL SUBL
0.4000 mg | SUBLINGUAL_TABLET | SUBLINGUAL | Status: DC | PRN
  Administered 2017-01-19 (×2): 0.4 mg via SUBLINGUAL

## 2017-01-19 MED ORDER — PANTOPRAZOLE SODIUM 20 MG PO TBEC: 20.0000 mg | DELAYED_RELEASE_TABLET | Freq: Every day | ORAL | 11 refills | Status: DC

## 2017-01-19 MED ORDER — ASPIRIN 81 MG PO CHEW: 81.0000 mg | CHEWABLE_TABLET | Freq: Every day | ORAL | 11 refills | Status: DC

## 2017-01-19 MED ORDER — METOPROLOL TARTRATE 25 MG PO TABS: 12.5000 mg | ORAL_TABLET | Freq: Two times a day (BID) | ORAL | 3 refills | Status: DC

## 2017-01-19 MED ORDER — TICAGRELOR 90 MG PO TABS: 90.0000 mg | ORAL_TABLET | Freq: Two times a day (BID) | ORAL | 11 refills | Status: DC

## 2017-01-19 MED ORDER — FUROSEMIDE 10 MG/ML IJ SOLN
20.0000 mg | Freq: Once | INTRAMUSCULAR | Status: AC
  Administered 2017-01-19: 11:00:00 20 mg via INTRAVENOUS
  Filled 2017-01-19: qty 2

## 2017-01-19 MED ORDER — NITROGLYCERIN 0.4 MG SL SUBL
SUBLINGUAL_TABLET | SUBLINGUAL | Status: AC
  Filled 2017-01-19: qty 2

## 2017-01-19 MED ORDER — TICAGRELOR 90 MG PO TABS: 90.0000 mg | ORAL_TABLET | Freq: Two times a day (BID) | ORAL | 0 refills | Status: DC

## 2017-01-19 MED ORDER — METOPROLOL TARTRATE 12.5 MG HALF TABLET: 12.5000 mg | ORAL_TABLET | Freq: Two times a day (BID) | ORAL | Status: DC

## 2017-01-19 MED ORDER — ISOSORBIDE MONONITRATE ER 30 MG PO TB24: 30.0000 mg | ORAL_TABLET | Freq: Every day | ORAL | 3 refills | Status: DC

## 2017-01-19 MED ORDER — NITROGLYCERIN 0.4 MG SL SUBL: 0.4000 mg | SUBLINGUAL_TABLET | SUBLINGUAL | 12 refills | Status: DC | PRN

## 2017-01-19 NOTE — Progress Notes (Signed)
2  S/W NICOLE @ [employer]  # [phone]   BRILINTA 90 MG BID    COVER- YES  CO-PAT- $ 45.00  TIER - 3 DRUG  PRIOR APPROVAL- NO  DEDUCTIBLE - NO   PHARMACY : [facility]  MAIL-ORDER FOR 90 DAY SUPPLY $ 96.0

## 2017-01-19 NOTE — Progress Notes (Signed)
Progress Note  Patient Name: [name] Date of Encounter: 01/19/2017  Primary Cardiologist: Dr. Acie Fredrickson  Subjective   The patient had chest pain on and off throughout the night, it went away at approximately 9:15am. He describes it as overall mild but the sensation is like pressure/heavy rocks sitting on his chest. The sensation was similar to that of which brought him to the hospital for evaluation originally. He now feels well.  Inpatient Medications    Scheduled Meds: . aspirin  81 mg Oral Daily  . atorvastatin  80 mg Oral q1800  . isosorbide mononitrate  30 mg Oral Daily  . metoprolol tartrate  25 mg Oral BID  . nitroGLYCERIN      . sodium chloride flush  3 mL Intravenous Q12H  . ticagrelor  90 mg Oral BID   Continuous Infusions: . sodium chloride Stopped (01/19/17 0300)  . sodium chloride     PRN Meds: sodium chloride, acetaminophen, diazepam, nitroGLYCERIN, ondansetron (ZOFRAN) IV, sodium chloride flush, zolpidem   Vital Signs    Vitals:   01/19/17 0434 01/19/17 0730 01/19/17 0830 01/19/17 0836  BP: (!) 91/55 106/60 114/67 106/67  Pulse: (!) 46 (!) 50    Resp: 13 13 14 14   Temp: 97.8 F (36.6 C) 98.2 F (36.8 C)    TempSrc: Oral Oral    SpO2: 99% 96%    Weight: 213 lb 13.5 oz (97 kg)     Height:        Intake/Output Summary (Last 24 hours) at 01/19/17 0916 Last data filed at 01/19/17 0700  Gross per 24 hour  Intake          1822.67 ml  Output              950 ml  Net           872.67 ml   Filed Weights   01/18/17 1026 01/19/17 0434  Weight: 212 lb (96.2 kg) 213 lb 13.5 oz (97 kg)    Telemetry    SR, frequent episodes of bradycardia mid to upper 40's - Personally Reviewed   Physical Exam   GEN: Well nourished, well developed HEENT: normal  Neck: no JVD, carotid bruits, or masses Cardiac: RRR. no murmurs, rubs, or gallops,no edema. Intact distal pulses bilaterally.  Respiratory: clear to auscultation bilaterally, normal work of  breathing GI: soft, nontender, nondistended, + BS MS: no deformity or atrophy, well appearing cath site. Skin: warm and dry, no rash Neuro: Alert and Oriented x 3, Strength and sensation are intact Psych:   Full affect  Labs    Chemistry Recent Labs Lab 01/17/17 1449 01/19/17 0357  NA 143 139  K 4.6 3.8  CL 103 107  CO2 27 28  GLUCOSE 89 94  BUN 26 18  CREATININE 1.14 1.09  CALCIUM 9.4 8.2*  GFRNONAA 63 >60  GFRAA 73 >60  ANIONGAP  --  4*     Hematology Recent Labs Lab 01/17/17 1449 01/19/17 0357  WBC 6.2 6.1  RBC 4.56 3.94*  HGB 13.9 11.7*  HCT 42.3 36.3*  MCV 93 92.1  MCH 30.5 29.7  MCHC 32.9 32.2  RDW 14.9 14.8  PLT 228 177     Radiology    No results found.  Cardiac Studies   CORONARY STENT INTERVENTION  LEFT HEART CATH AND CORONARY ANGIOGRAPHY  Conclusion     Ost LAD lesion, 20 %stenosed.  Mid LAD lesion, 90 %stenosed.  Dist LAD lesion, 50 %stenosed.  Prox  Cx to Mid Cx lesion, 40 %stenosed.  Ost 2nd Mrg to 2nd Mrg lesion, 25 %stenosed.  Ost RCA lesion, 65 %stenosed.  A STENT RESOLUTE ONYX 3.0X26 drug eluting stent was successfully placed, and overlaps previously placed stent.  Ost LAD to Mid LAD lesion, 30 %stenosed.  Post intervention, there is a 0% residual stenosis.  Mid RCA to Dist RCA lesion, 15 %stenosed.  There is mild left ventricular systolic dysfunction.  The left ventricular ejection fraction is 45-50% by visual estimate.   Multivessel CAD with 20% ostial LAD stenosis prior to previously placed tandem LAD stents with 30% diffuse narrowing in the stented segment and focal 85-90% in-stent restenosis in the midportion of the stent and 50% mid LAD stenosis; 40% proximal circumflex stenosis with mild luminal irregularity in the mid AV groove circumflex.;  65% focal eccentric proximal RCA stenosis with mild luminal irregularity in the mid RCA proximal to the acute margin.  Successful percutaneous coronary intervention for  in-stent restenosis of the LAD with cutting balloon followed by insertion of a 3.026 mm Resolute Onyx DES stent postdilated 3.25 mm with the in-stent restenosis being reduced to 0%.  Mild LV dysfunction with focal mid-distal anterolateral hypocontractility an ejection fraction of 45%.  RECOMMENDATION: The patient will continue a dual antiplatelet therapy for minimum of 1 year.  Concomitant medical therapy will be added for his CAD with nitrates and beta blocker therapy.  High potency statin therapy will be initiated.  Consider initiation of ACE-I/ARB therapy.     Patient Profile     [name] is a 76 y.o. male with a hx of Coronary artery disease been years ago. He is a previous patient of Dr. Bing Quarry. He presents now with symptoms of exertional chest pain and shortness breath.  Assessment & Plan     1. CAD s/p PCI and DES to LAD: Recommendation is to continue dual antiplatelet therapy for minimum of 1 year- Brilinta and Aspirin . Added for his CAD will be nitrates, beta blocker therapy and high potency statin. He has had recurrent chest pain throughout the night, mild but it did feel like his pain last night but he is now pain free. Cardiac Rehab has not yet ambulated the patient.  I recommend we continue to monitor him and have cardiac rehab walk him this afternoon if he stays without any significant episodes of chest pains. I have dicussed this with CR and they will come back when needed.  2. Hypertension: blood pressure well controlled during this admission, continue current regimen of nitrates and BB.  3. Bradycardia: HR frequently into the upper 40's, he is on Metoprolol tartrate 25 mg BID, recommend decreasing this to 12.5 mg and then see if there is room to titrate up on outpatient visit.  4. Mild Anemia: Mild drop in hemoglobin post cath 13.9 >> 11.7. Recheck CBC at outpatient follow-up. No signs of active bleeding at this time.   Kristopher Glee, PA-C   01/19/2017, 9:16 AM

## 2017-01-19 NOTE — Discharge Summary (Signed)
Discharge Summary    Patient ID: [name],  MRN: [id number], DOB/AGE: November 27, 1940 76 y.o.  Admit date: 01/18/2017 Discharge date: 01/19/2017  Primary Care Provider: Roger Nichols (Inactive) Primary Cardiologist: [name]   Discharge Diagnoses    Principal Problem:   Status post coronary artery stent placement: a. (01/18/17) PCI and DES to LAD, EF 40-45% Active Problems:   Hypertension   Acute on chronic combined systolic and diastolic CHF (congestive heart failure) ([place])   Allergies No Known Allergies   History of Present Illness     [name] is a 76 year old male with a history of coronary artery disease, MI in 1996 with stenting- stopped smoking at that time. He was previously a [name] patient. He presented to the cardiology office and saw [name] on January 17, 2017 by referral from Timothy Rosales, NP. He has been having shortness of breath at night and during the day. He can walk only about 5 minutes before developing significant dyspnea. But also will have CP at rest. He has been having associated leg claudication as well.  Does not exercise but does workout in the yard. Based on these presenting symptoms nonspecific ST changes on EKG- ST depressions in the AM that had since resolved, it was decided that he needed a cardiac catheterization scheduled for the next day.  Hospital Course     Consultants: None  He presented to the hospital for his cardiac catheterization on January 18, 2017. On arrival he was chest pain free and underwent a cardiac catheterization with successful PCI with DES to the LAD; EF of 40-45%. The recommendation is for dual antiplatelet therapy: ASPIRIN and BRILINTA. He was started on METOPROLOL 25 mg BID and had some bradycardia in the rate of 40-50s overnight so his dose was decreased to 12.5 mg BID, consider uptitrating as able in the outpatient setting. He was started on IMDUR 30 mg daily. Consider starting him on ACE/ARB as an  outpatient, it was not started this admission because today his BP was borderline low between 97-416 systolic but he needs it because of his low EF. He was taking Mobic, this has been discontinued and can be discussed as an outpatient. I have also discontinued his Nexium and started him on Protonix instead.  The morning after his cardiaccatheterizaiton [name] having chest pain throughout the night that had eventually resolved prior to my examination. He had crackles to his bilateral lung bases, mild shortness of breath, was net positive ~ 800 ml of fluid with an EF is 45%. He was given a dose of 20 mg IV lasix, diuresed ~ 350 ml of fluid and was able to ambulate, reported feeling much better and was very anxious to go home. He had with no further episodes of chest pain through out the day.  The radial catheter site is stable He has been seen by [name] today and deemed ready for discharge home. All follow-up appointments have been scheduled. A written RX for a 30 day free supply of Brilinta was provided for the patient. Discharge medications are listed below.  _____________  Discharge Vitals Blood pressure 115/73, pulse (!) 55, temperature 98 F (36.7 C), temperature source Oral, resp. rate 13, height 5\' 10"  (1.778 m), weight 213 lb 13.5 oz (97 kg), SpO2 96 %.  Filed Weights   01/18/17 1026 01/19/17 0434  Weight: 212 lb (96.2 kg) 213 lb 13.5 oz (97 kg)    Labs & Radiologic  Studies     CBC  Recent Labs  01/17/17 1449 01/19/17 0357  WBC 6.2 6.1  HGB 13.9 11.7*  HCT 42.3 36.3*  MCV 93 92.1  PLT 228 810   Basic Metabolic Panel  Recent Labs  01/17/17 1449 01/19/17 0357  NA 143 139  K 4.6 3.8  CL 103 107  CO2 27 28  GLUCOSE 89 94  BUN 26 18  CREATININE 1.14 1.09  CALCIUM 9.4 8.2*    No results found.   Diagnostic Studies/Procedures    CORONARY STENT INTERVENTION  LEFT HEART CATH AND CORONARY ANGIOGRAPHY  Conclusion     Ost LAD lesion, 20  %stenosed.  Mid LAD lesion, 90 %stenosed.  Dist LAD lesion, 50 %stenosed.  Prox Cx to Mid Cx lesion, 40 %stenosed.  Ost 2nd Mrg to 2nd Mrg lesion, 25 %stenosed.  Ost RCA lesion, 65 %stenosed.  A STENT RESOLUTE ONYX 3.0X26 drug eluting stent was successfully placed, and overlaps previously placed stent.  Ost LAD to Mid LAD lesion, 30 %stenosed.  Post intervention, there is a 0% residual stenosis.  Mid RCA to Dist RCA lesion, 15 %stenosed.  There is mild left ventricular systolic dysfunction.  The left ventricular ejection fraction is 45-50% by visual estimate.  Multivessel CAD with 20% ostial LAD stenosis prior to previously placed tandem LAD stents with 30% diffuse narrowing in the stented segment and focal 85-90% in-stent restenosis in the midportion of the stent and 50% mid LAD stenosis; 40% proximal circumflex stenosis with mild luminal irregularity in the mid AV groove circumflex.; 65% focal eccentric proximal RCA stenosis with mild luminal irregularity in the mid RCA proximal to the acute margin.  Successful percutaneous coronary intervention for in-stent restenosis of the LAD with cutting balloon followed by insertion of a 3.026 mm Resolute Onyx DES stent postdilated 3.25 mm with the in-stent restenosis being reduced to 0%.  Mild LV dysfunction with focal mid-distal anterolateral hypocontractility an ejection fraction of 45%.  RECOMMENDATION: The patient will continue a dual antiplatelet therapy for minimum of 1 year. Concomitant medical therapy will be added for his CAD with nitrates and beta blocker therapy. High potency statin therapy will be initiated. Consider initiation of ACE-I/ARB therapy    _____________    Disposition   Pt is being discharged home today in good condition.  Follow-up Plans & Appointments    Follow-up Information    [name], [name], [place] Follow up on 01/30/2017.   Specialty:  Cardiology Why:  Your appointment time is at 9:30am,  please arrive 15 minutes early.  The office will call your regarding scheudling a follow-up Echocardiogram Contact information: [place] [id number] [phone]          Discharge Instructions    Amb Referral to Cardiac Rehabilitation    Complete by:  As directed    Diagnosis:  Coronary Stents      Discharge Medications   Allergies as of 01/19/2017   No Known Allergies     Medication List    STOP taking these medications   aspirin 325 MG tablet Replaced by:  aspirin 81 MG chewable tablet   esomeprazole 40 MG capsule Commonly known as:  NEXIUM   meloxicam 7.5 MG tablet Commonly known as:  MOBIC     TAKE these medications   aspirin 81 MG chewable tablet Chew 1 tablet (81 mg total) by mouth daily. Replaces:  aspirin 325 MG tablet   atorvastatin 80 MG tablet Commonly known as:  LIPITOR  Take 80 mg by mouth daily.   FLUoxetine 40 MG capsule Commonly known as:  PROZAC Take 40 mg by mouth daily.   HYDROcodone-acetaminophen 7.5-325 MG tablet Commonly known as:  NORCO Take 1 tablet by mouth daily as needed. For pain.   isosorbide mononitrate 30 MG 24 hr tablet Commonly known as:  IMDUR Take 1 tablet (30 mg total) by mouth daily.   metoprolol tartrate 25 MG tablet Commonly known as:  LOPRESSOR Take 0.5 tablets (12.5 mg total) by mouth 2 (two) times daily. What changed:  medication strength  how much to take  when to take this   nitroGLYCERIN 0.4 MG SL tablet Commonly known as:  NITROSTAT Place 1 tablet (0.4 mg total) under the tongue every 5 (five) minutes as needed. What changed:  Another medication with the same name was added. Make sure you understand how and when to take each.   nitroGLYCERIN 0.4 MG SL tablet Commonly known as:  NITROSTAT Place 1 tablet (0.4 mg total) under the tongue every 5 (five) minutes as needed for chest pain. What changed:  You were already taking a medication with the same name, and this  prescription was added. Make sure you understand how and when to take each.   pantoprazole 20 MG tablet Commonly known as:  PROTONIX Take 1 tablet (20 mg total) by mouth daily.   promethazine 12.5 MG tablet Commonly known as:  PHENERGAN Take 12.5 mg by mouth every 6 (six) hours as needed for nausea or vomiting.   ticagrelor 90 MG Tabs tablet Commonly known as:  BRILINTA Take 1 tablet (90 mg total) by mouth 2 (two) times daily.   ticagrelor 90 MG Tabs tablet Commonly known as:  BRILINTA Take 1 tablet (90 mg total) by mouth 2 (two) times daily.       Aspirin prescribed at discharge?  Yes High Intensity Statin Prescribed? (Lipitor 40-80mg  or Crestor 20-40mg ): Yes Beta Blocker Prescribed? Yes For EF 45% or less, Was ACEI/ARB Prescribed? No: borderline low blood pressure ADP Receptor Inhibitor Prescribed? (i.e. Plavix etc.-Includes Medically Managed Patients): Yes For EF <45%, Aldosterone Inhibitor Prescribed? No: N/A Was EF assessed during THIS hospitalization? Yes Was Cardiac Rehab II ordered? (Included Medically managed Patients): Yes   Outstanding Labs/Studies   CBC and BMP Follow-up Echocardiogram for EF of 40-45%   Duration of Discharge Encounter   Greater than 30 minutes including physician time.  Signed, Linus Mako PA-C 01/19/2017, 3:50 PM

## 2017-01-19 NOTE — Progress Notes (Addendum)
Patient starts to complain of chest pain at approx Baron Sane NP informed. Patient given Nitro SLx2 and states pain is "almost gone, 2/10 at this time." Chest pain started off as a 7/10 that was described as "rocks sitting in my mid chest." Will continue to monitor, 2 Liters Nasal Cannula applied.  Patient also states, "This chest pain started last night, I put on the oxygen and tried to rest but it never went away." Patient did ambulate in room prior to starting of chest pain.  No other S/S of distress noted or complaints voiced at this time.

## 2017-01-19 NOTE — Care Management Note (Addendum)
Case Management Note  Patient Details  Name: [name] MRN: [id number] Date of Birth: [birth year]  Subjective/Objective:  Form home with wife, pta indep, s/p coronary stent intervention, will be on brilinta. NCM awaiting benefit check.  NCM gave him the 30 day free savings coupon, he will go to [facility] on [address] And they do have in stock and they take the coupons.  Patient states his home phone is [phone].  Co pay is 45.00 per benefit check.                              Action/Plan: NCM will follow for dc needs.   Expected Discharge Date:                  Expected Discharge Plan:  Home/Self Care  In-House Referral:     Discharge planning Services  CM Consult  Post Acute Care Choice:    Choice offered to:     DME Arranged:    DME Agency:     HH Arranged:    [place] Agency:     Status of Service:  Completed, signed off  If discussed at [employer] of Stay Meetings, dates discussed:    Additional Comments:  Zenon Mayo, RN 01/19/2017, 10:05 AM

## 2017-01-19 NOTE — Progress Notes (Signed)
Patient is sitting on side of bed. [name] has ambulated patient in hallway. No shortness of breath noted and patient states "I do not have any chest pain." Tolerated ambulation well. No S/S of distress noted or complaints voiced at this time.

## 2017-01-19 NOTE — Progress Notes (Signed)
CARDIAC REHAB PHASE I   PRE:  Rate/Rhythm: 53 SB  BP:  Sitting: 98/68      Pt with chest pain off and on throughout the night, received nitroglycerin this morning with relief, now resolved. Will hold ambulation at this time as he states his symptoms (pressure/heaviness) resolved approximately 15 minutes ago. Completed PCI/stent education.  Reviewed risk factors, PCI book, anti-platelet therapy, stent card, activity restrictions, ntg, exercise, heart healthy diet and phase 2 cardiac rehab. Pt verbalized understanding, receptive to education. Pt agrees to phase 2 cardiac rehab referral, will send to [facility] per pt request. Pt in bed, call bell within reach.     [phone] Lenna Sciara, RN, BSN 01/19/2017 9:39 AM

## 2017-01-19 NOTE — Discharge Instructions (Signed)

## 2017-01-23 ENCOUNTER — Telehealth (HOSPITAL_COMMUNITY): Payer: Self-pay

## 2017-01-23 NOTE — Telephone Encounter (Signed)
Patient insurances are active and benefits verified. Patient insurances are Medicare A/B and AARP supplement.   Medicare A/B - no co-payment, deductible $183.00/$137.46 has been met, 20% co-insurance, no out of pocket and no pre-authorization. Passport/reference [phone].  AARP medicare supplement - no co-payment, no deductible, no out of pocket, no co-insurance and no pre-authorization. Follows medicare guidelines. Passport/reference # [phone].

## 2017-01-26 ENCOUNTER — Encounter: Payer: Self-pay | Admitting: Physician Assistant

## 2017-01-26 NOTE — Progress Notes (Signed)
Cardiology Office Note    Date:  01/30/2017   ID:  [name], DOB Nov 18, 1940, MRN [id number]  PCP:  Timothy Shelter, MD (Inactive)  Cardiologist:  Dr. Acie Nichols   Chief Complaint: Hospital follow up after cath   History of Present Illness:   [name] is a 76 y.o. male with hx of CAD with MI in 1996, HTN and HLD presented for follow up   Seen by Dr. Acie Nichols 01/17/17 for chest pain suggestive of [place]. She had outpatient cath on 01/18/17 as describe below. Underwent successful PCI for in-stent restenosis of the LAD with cutting balloon followed by insertion of a 3.026 mm Resolute Onyx DES stent postdilated 3.25 mm with the in-stent restenosis being reduced to 0%. Lv gram showed LVEF of 45%. Overnight mild sob with crackles noted. Given lasix 20mg  IV x 1 with improved symptoms. No recurrent chest pain. Plan to get outpatient echo. Metoprolol reduced to 12.5mg  BID due to overnight bradycardia.   Here today for further evaluation. Initially after discharge he had few episodes of intermitted chest pain and SOB. Took nitro x 2 different occasion with improved symptoms. Some Orthopnea. No LE edema, palpitation, syncope, dizziness or melena. Compliant with medication and low sodium diet. He is now walking approximately 15 minutes at time without angina. How resolved chest pain and SOB x 3 days.    Past Medical History:  Diagnosis Date  . Adenomatous colon polyp   . Arthritis   . CAD (coronary artery disease)    a. MI in 1996 with stent placement b. cath 01/18/17 - S/p PCI of in-stent restenosis of pLAD with cutting ballon & DES; 20% ostial LAD; 40% pro Cx; 60% focal pRCA  . Clostridium difficile infection   . Depression   . Diverticulosis   . H. pylori infection   . HTN (hypertension)   . Hyperlipidemia   . MI (myocardial infarction) [facility])     Past Surgical History:  Procedure Laterality Date  . BRAIN TUMOR EXCISION  1954   benign tumor in back of head, done at  [facility]  . CARDIAC  CATHETERIZATION    . COLONOSCOPY     hx of polyps  . [place]; 1997; 01/18/2017  . CORONARY STENT INTERVENTION N/A 01/18/2017   Procedure: CORONARY STENT INTERVENTION;  Surgeon: Timothy Sine, MD;  Location: [place] [facility];  Service: Cardiovascular;  Laterality: N/A;  . JOINT REPLACEMENT    . LEFT HEART CATH AND CORONARY ANGIOGRAPHY N/A 01/18/2017   Procedure: LEFT HEART CATH AND CORONARY ANGIOGRAPHY;  Surgeon: Timothy Sine, MD;  Location: [place] [facility];  Service: Cardiovascular;  Laterality: N/A;  . stent x2 in the LAD  with intra-aortic balloon pump support  1996  . TONSILLECTOMY    . TOTAL KNEE ARTHROPLASTY Bilateral     Current Medications: Prior to Admission medications   Medication Sig Start Date End Date Taking? Authorizing Provider  aspirin 81 MG chewable tablet Chew 1 tablet (81 mg total) by mouth daily. 01/20/17   Delos Haring, PA-C  atorvastatin (LIPITOR) 80 MG tablet Take 80 mg by mouth daily.      [provider]  FLUoxetine (PROZAC) 40 MG capsule Take 40 mg by mouth daily.    [provider]  HYDROcodone-acetaminophen (NORCO) 7.5-325 MG tablet Take 1 tablet by mouth daily as needed. For pain. 01/17/17   [provider]  isosorbide mononitrate (IMDUR) 30 MG 24 hr tablet Take  1 tablet (30 mg total) by mouth daily. 01/20/17   Delos Haring, PA-C  metoprolol tartrate (LOPRESSOR) 25 MG tablet Take 0.5 tablets (12.5 mg total) by mouth 2 (two) times daily. 01/19/17   Delos Haring, PA-C  nitroGLYCERIN (NITROSTAT) 0.4 MG SL tablet Place 1 tablet (0.4 mg total) under the tongue every 5 (five) minutes as needed. 01/17/17   Nahser, Wonda Cheng, MD  nitroGLYCERIN (NITROSTAT) 0.4 MG SL tablet Place 1 tablet (0.4 mg total) under the tongue every 5 (five) minutes as needed for chest pain. 01/19/17   Delos Haring, PA-C  pantoprazole (PROTONIX) 20 MG tablet Take 1 tablet (20 mg total) by mouth daily. 01/19/17 01/19/18   Delos Haring, PA-C  promethazine (PHENERGAN) 12.5 MG tablet Take 12.5 mg by mouth every 6 (six) hours as needed for nausea or vomiting.    [provider]  ticagrelor (BRILINTA) 90 MG TABS tablet Take 1 tablet (90 mg total) by mouth 2 (two) times daily. 01/19/17   Delos Haring, PA-C  ticagrelor (BRILINTA) 90 MG TABS tablet Take 1 tablet (90 mg total) by mouth 2 (two) times daily. 01/19/17   Delos Haring, PA-C    Allergies:   Patient has no known allergies.   Social History   Social History  . Marital status: Married    Spouse name: N/A  . Number of children: 1  . Years of education: N/A   Occupational History  . Retired    Social History Main Topics  . Smoking status: Former Smoker    Quit date: 06/06/1994  . Smokeless tobacco: Never Used  . Alcohol use No  . Drug use: No  . Sexual activity: Yes   Other Topics Concern  . None   Social History Narrative  . None     Family History:  The patient's family history includes Cancer in his sister; Heart attack in his mother; Heart attack (age of onset: 31) in his brother.   ROS:   Please see the history of present illness.    ROS All other systems reviewed and are negative.   PHYSICAL EXAM:   VS:  BP 110/74   Pulse (!) 56   Ht 5\' 10"  (1.778 m)   Wt 208 lb (94.3 kg)   BMI 29.84 kg/m    GEN: Well nourished, well developed, in no acute distress  HEENT: normal  Neck: no JVD, carotid bruits, or masses Cardiac: RRR; no murmurs, rubs, or gallops,no edema. R radial cath site without hematoma  Respiratory:  Faint bibasilar rales,  normal work of breathing GI: soft, nontender, nondistended, + BS MS: no deformity or atrophy  Skin: warm and dry, no rash Neuro:  Alert and Oriented x 3, Strength and sensation are intact Psych: euthymic mood, full affect  Wt Readings from Last 3 Encounters:  01/30/17 208 lb (94.3 kg)  01/19/17 213 lb 13.5 oz (97 kg)  01/17/17 212 lb 6.4 oz (96.3 kg)      Studies/Labs Reviewed:    EKG:  EKG is not ordered today.    Recent Labs: 01/19/2017: BUN 18; Creatinine, Ser 1.09; Hemoglobin 11.7; Platelets 177; Potassium 3.8; Sodium 139   Lipid Panel    Component Value Date/Time   CHOL 181 06/23/2009 1147   TRIG 117.0 06/23/2009 1147   HDL 41.40 06/23/2009 1147   CHOLHDL 4 06/23/2009 1147   VLDL 23.4 06/23/2009 1147   LDLCALC 116 (H) 06/23/2009 1147   LDLDIRECT 159.1 12/10/2007 0851    Additional studies/ records that were reviewed  today include:   Echocardiogram:  Cardiac Catheterization:  01/18/17 LEFT HEART CATH AND CORONARY ANGIOGRAPHY  Conclusion     Ost LAD lesion, 20 %stenosed.  Mid LAD lesion, 90 %stenosed.  Dist LAD lesion, 50 %stenosed.  Prox Cx to Mid Cx lesion, 40 %stenosed.  Ost 2nd Mrg to 2nd Mrg lesion, 25 %stenosed.  Ost RCA lesion, 65 %stenosed.  A STENT RESOLUTE ONYX 3.0X26 drug eluting stent was successfully placed, and overlaps previously placed stent.  Ost LAD to Mid LAD lesion, 30 %stenosed.  Post intervention, there is a 0% residual stenosis.  Mid RCA to Dist RCA lesion, 15 %stenosed.  There is mild left ventricular systolic dysfunction.  The left ventricular ejection fraction is 45-50% by visual estimate.   Multivessel CAD with 20% ostial LAD stenosis prior to previously placed tandem LAD stents with 30% diffuse narrowing in the stented segment and focal 85-90% in-stent restenosis in the midportion of the stent and 50% mid LAD stenosis; 40% proximal circumflex stenosis with mild luminal irregularity in the mid AV groove circumflex.;  65% focal eccentric proximal RCA stenosis with mild luminal irregularity in the mid RCA proximal to the acute margin.  Successful percutaneous coronary intervention for in-stent restenosis of the LAD with cutting balloon followed by insertion of a 3.026 mm Resolute Onyx DES stent postdilated 3.25 mm with the in-stent restenosis being reduced to 0%.  Mild LV dysfunction with focal mid-distal  anterolateral hypocontractility an ejection fraction of 45%.  RECOMMENDATION: The patient will continue a dual antiplatelet therapy for minimum of 1 year.  Concomitant medical therapy will be added for his CAD with nitrates and beta blocker therapy.  High potency statin therapy will be initiated.  Consider initiation of ACE-I/ARB therapy.           ASSESSMENT & PLAN:    1. CAD s/p PTCA and DES for in-stent stenosis of pLAD 01/2017 - initially required SL nitro.  Walking without angina. His symptoms could be due to Brillinta side effect or CHF however doing well in past 3 days. Continue ASA, Brillinta, Statin, Imdur and BB.   2. Chronic systolic CHF/ICM - LV gram showed EF of 45%.mild rales on exam today. Discussed heart failure and low sodium diet  in detailed. Will get echo. PRN lasix. He will call us if return or worsening of dyspnea.   3. HLD - No results found for requested labs within last 8760 hours.  - Continue statin. Followed by PCP  4. HTN -Stable on BB. Unable to add ACE/ARB due to soft BP and also pending echo.       Medication Adjustments/Labs and Tests Ordered: Current medicines are reviewed at length with the patient today.  Concerns regarding medicines are outlined above.  Medication changes, Labs and Tests ordered today are listed in the Patient Instructions below. Patient Instructions  Medication Instructions:  START Lasix 20mg  Take 1 tablet as daily as needed  Labwork: None   Testing/Procedures: Your physician has requested that you have an echocardiogram. Echocardiography is a painless test that uses sound waves to create images of your heart. It provides your doctor with information about the size and shape of your heart and how well your heart's chambers and valves are working. This procedure takes approximately one hour. There are no restrictions for this procedure.  PLEASE SCHEDULE THE ECHO THE ORDER WAS PLACED 01/19/17  Follow-Up: KEEP UP COMING  APPOINTMENT WITH DR NASHER AS SCHEDULED.  Any Other Special Instructions Will Be Listed Below (If Applicable).  If you need a refill on your cardiac medications before your next appointment, please call your pharmacy.     Jarrett Soho, [place]  01/30/2017 9:32 AM    [facility] [address], [place], [place]  [address] Phone: [phone]; Fax: [phone]

## 2017-01-27 ENCOUNTER — Telehealth (HOSPITAL_COMMUNITY): Payer: Self-pay

## 2017-01-27 NOTE — Telephone Encounter (Signed)
I called and left message on voicemail to call office about scheduling for cardiac rehab. I left office contact information on patient voicemail to return call.  ° °

## 2017-01-30 ENCOUNTER — Ambulatory Visit (INDEPENDENT_AMBULATORY_CARE_PROVIDER_SITE_OTHER): Payer: Medicare Other | Admitting: Physician Assistant

## 2017-01-30 ENCOUNTER — Encounter: Payer: Self-pay | Admitting: Physician Assistant

## 2017-01-30 VITALS — BP 110/74 | HR 56 | Ht 70.0 in | Wt 208.0 lb

## 2017-01-30 DIAGNOSIS — I2510 Atherosclerotic heart disease of native coronary artery without angina pectoris: Secondary | ICD-10-CM

## 2017-01-30 DIAGNOSIS — I5022 Chronic systolic (congestive) heart failure: Secondary | ICD-10-CM

## 2017-01-30 DIAGNOSIS — R0602 Shortness of breath: Secondary | ICD-10-CM

## 2017-01-30 DIAGNOSIS — I10 Essential (primary) hypertension: Secondary | ICD-10-CM | POA: Diagnosis not present

## 2017-01-30 DIAGNOSIS — E785 Hyperlipidemia, unspecified: Secondary | ICD-10-CM | POA: Diagnosis not present

## 2017-01-30 DIAGNOSIS — I25110 Atherosclerotic heart disease of native coronary artery with unstable angina pectoris: Secondary | ICD-10-CM

## 2017-01-30 MED ORDER — FUROSEMIDE 20 MG PO TABS: 20.0000 mg | ORAL_TABLET | Freq: Every day | ORAL | 3 refills | Status: DC | PRN

## 2017-01-30 NOTE — Patient Instructions (Addendum)
Medication Instructions:  START Lasix 20mg  Take 1 tablet as daily as needed  Labwork: None   Testing/Procedures: Your physician has requested that you have an echocardiogram. Echocardiography is a painless test that uses sound waves to create images of your heart. It provides your doctor with information about the size and shape of your heart and how well your heart's chambers and valves are working. This procedure takes approximately one hour. There are no restrictions for this procedure.  PLEASE SCHEDULE THE ECHO THE ORDER WAS PLACED 01/19/17  Follow-Up: KEEP UP COMING APPOINTMENT WITH DR NASHER AS SCHEDULED.  Any Other Special Instructions Will Be Listed Below (If Applicable).  If you need a refill on your cardiac medications before your next appointment, please call your pharmacy.

## 2017-01-31 ENCOUNTER — Other Ambulatory Visit: Payer: Self-pay

## 2017-01-31 ENCOUNTER — Ambulatory Visit (HOSPITAL_COMMUNITY): Payer: Medicare Other | Attending: Emergency Medicine

## 2017-01-31 DIAGNOSIS — I252 Old myocardial infarction: Secondary | ICD-10-CM | POA: Insufficient documentation

## 2017-01-31 DIAGNOSIS — E785 Hyperlipidemia, unspecified: Secondary | ICD-10-CM | POA: Diagnosis not present

## 2017-01-31 DIAGNOSIS — I509 Heart failure, unspecified: Secondary | ICD-10-CM | POA: Diagnosis not present

## 2017-01-31 DIAGNOSIS — I2510 Atherosclerotic heart disease of native coronary artery without angina pectoris: Secondary | ICD-10-CM | POA: Diagnosis not present

## 2017-01-31 DIAGNOSIS — I5021 Acute systolic (congestive) heart failure: Secondary | ICD-10-CM | POA: Insufficient documentation

## 2017-01-31 DIAGNOSIS — I110 Hypertensive heart disease with heart failure: Secondary | ICD-10-CM | POA: Insufficient documentation

## 2017-01-31 MED ORDER — PERFLUTREN LIPID MICROSPHERE
1.0000 mL | INTRAVENOUS | Status: AC | PRN
  Administered 2017-01-31: 2 mL via INTRAVENOUS

## 2017-02-07 ENCOUNTER — Telehealth: Payer: Self-pay | Admitting: Cardiovascular Disease

## 2017-02-07 NOTE — Telephone Encounter (Signed)
New message   Pt calling stating that he had stents placed on the 15th and has had some issues since then. He scheduled an appt with Dr. Acie Fredrickson for tomorrow at 1:40pm.  Pt c/o Shortness Of Breath: STAT if SOB developed within the last 24 hours or pt is noticeably SOB on the phone  1. Are you currently SOB (can you hear that pt is SOB on the phone)? No   2. How long have you been experiencing SOB? A couple days  3. Are you SOB when sitting or when up moving around? Mostly moving around  4. Are you currently experiencing any other symptoms? Pt states he has been sweating  Pt c/o Syncope: STAT if syncope occurred within 30 minutes and pt complains of lightheadedness High Priority if episode of passing out, completely, today or in last 24 hours   1. Did you pass out today? no   2. When is the last time you passed out? yesterday   3. Has this occurred multiple times? no   4. Did you have any symptoms prior to passing out? Dizziness when starting to move around

## 2017-02-07 NOTE — Telephone Encounter (Signed)
Spoke with patient who states he has not been feeling well lately. States he was feeling light-headed yesterday, got up to walk around and fell to his knees. He states at [facility] one day last week, his BP was 74/50's mmHg. He states he stopped taking BP medication prescribed by Dr. Alyson Ingles, PCP approximately 2-3 weeks ago, cannot remember name. He reports taking all medications as prescribed, including today's morning dose. States he also notices that he feels more SOB. States he could not breath when he bent forward to put a nail in the floor yesterday. I advised him that the Brilinta can cause a feeling of SOB and advised him to drink a caffeinated beverage with his medication. He states he is currently drinking a soda with caffeine. I asked if he could get a home BP cuff so that he can monitor HR and BP. He states he will get one from the pharmacy and I asked him to call back to report the readings. He verbalized understanding and agreement with plan and thanked me for the call.

## 2017-02-07 NOTE — Telephone Encounter (Signed)
Spoke with patient who states repeat BP was 118/76 mmHg and pulse 59 bpm. He is scheduled to see Dr. Acie Fredrickson at 1:40 tomorrow. I advised him to continue current medications until seen by Dr. Acie Fredrickson. He thanked me for the call.

## 2017-02-08 ENCOUNTER — Ambulatory Visit (INDEPENDENT_AMBULATORY_CARE_PROVIDER_SITE_OTHER): Payer: Medicare Other | Admitting: Cardiovascular Disease

## 2017-02-08 ENCOUNTER — Encounter: Payer: Self-pay | Admitting: Cardiovascular Disease

## 2017-02-08 VITALS — BP 122/76 | HR 58 | Ht 70.0 in | Wt 209.4 lb

## 2017-02-08 DIAGNOSIS — I25110 Atherosclerotic heart disease of native coronary artery with unstable angina pectoris: Secondary | ICD-10-CM | POA: Diagnosis not present

## 2017-02-08 DIAGNOSIS — I2510 Atherosclerotic heart disease of native coronary artery without angina pectoris: Secondary | ICD-10-CM

## 2017-02-08 DIAGNOSIS — E782 Mixed hyperlipidemia: Secondary | ICD-10-CM | POA: Diagnosis not present

## 2017-02-08 NOTE — Patient Instructions (Signed)

## 2017-02-08 NOTE — Progress Notes (Signed)
Cardiology Office Note:    Date:  02/08/2017   ID:  [name], DOB Feb 20, 1941, MRN [id number]  PCP:  [name], MD  Cardiologist:  [name], MD    Referring MD: [name], MD   Chief Complaint  Patient presents with  . Follow-up    cad    History of Present Illness:    [name] is a 76 y.o. male with a hx of Coronary artery disease been years ago. He is a previous patient of Dr. Bing Quarry. He presents now with symptoms of exertional chest pain and shortness breath.  He has shortness of breath at night or during the day  Walking 5-6 minutes causes significant dyspnea.  Also has claudication in his legs with walking   Has occasional chest pain  - usually when he is at rest. Has chest squeezing. - occurs randomly  Has lots of indigestion - occurs randomly   Also has lots of sweating - not necessarliy related to his chest tightness  does not feel similar to his previous MI pain .   No CP since that time   Hx of MI in 1996 -  Had stent by [name] Stopped smoking at that time   Does not exercise regularly    .   Works out in the yard.   Sept. 5, 2018: [name] is seen today for follow up visit.   S/p recent stenting of his prox - mid LAD  Hx of CAD , stenting by [name] in the past .   PTs BP has been low Has been on a diet and has been cutting back on his protein and his food in general .  Has lost 8.5 lbs   Had an episode of CP at night.   Better after NTG . Still fairly active .   Walks a little ,  Just started a walking program .   Walks 1/2 mile a day without CP  His medical doctor had recently started him on a BP med and he has stopped it now (does not remember the name of it)   Echo Aug. 28 shows normal LV function    Past Medical History:  Diagnosis Date  . Adenomatous colon polyp   . Arthritis   . CAD (coronary artery disease)    a. MI in 1996 with stent placement b. cath 01/18/17 - S/p PCI of in-stent restenosis of pLAD  with cutting ballon & DES; 20% ostial LAD; 40% pro Cx; 60% focal pRCA  . Clostridium difficile infection   . Depression   . Diverticulosis   . H. pylori infection   . HTN (hypertension)   . Hyperlipidemia   . MI (myocardial infarction) [facility])     Past Surgical History:  Procedure Laterality Date  . BRAIN TUMOR EXCISION  1954   benign tumor in back of head, done at  [facility]  . CARDIAC CATHETERIZATION    . COLONOSCOPY     hx of polyps  . [place]; 1997; 01/18/2017  . CORONARY STENT INTERVENTION N/A 01/18/2017   Procedure: CORONARY STENT INTERVENTION;  Surgeon: Troy Sine, MD;  Location: [facility];  Service: Cardiovascular;  Laterality: N/A;  . JOINT REPLACEMENT    . LEFT HEART CATH AND CORONARY ANGIOGRAPHY N/A 01/18/2017   Procedure: LEFT HEART CATH AND CORONARY ANGIOGRAPHY;  Surgeon: Troy Sine, MD;  Location: [facility];  Service: Cardiovascular;  Laterality: N/A;  . stent  x2 in the LAD  with intra-aortic balloon pump support  1996  . TONSILLECTOMY    . TOTAL KNEE ARTHROPLASTY Bilateral     Current Medications: Current Meds  Medication Sig  . aspirin 81 MG chewable tablet Chew 1 tablet (81 mg total) by mouth daily.  Marland Kitchen atorvastatin (LIPITOR) 80 MG tablet Take 80 mg by mouth daily.    Marland Kitchen FLUoxetine (PROZAC) 40 MG capsule Take 40 mg by mouth daily.  . furosemide (LASIX) 20 MG tablet Take 20 mg by mouth daily as needed for fluid or edema.  Marland Kitchen HYDROcodone-acetaminophen (NORCO) 7.5-325 MG tablet Take 1 tablet by mouth daily as needed. For pain.  . isosorbide mononitrate (IMDUR) 30 MG 24 hr tablet Take 1 tablet (30 mg total) by mouth daily.  . metoprolol tartrate (LOPRESSOR) 25 MG tablet Take 0.5 tablets (12.5 mg total) by mouth 2 (two) times daily.  . nitroGLYCERIN (NITROSTAT) 0.4 MG SL tablet Place 1 tablet (0.4 mg total) under the tongue every 5 (five) minutes as needed for chest pain.  . pantoprazole (PROTONIX) 20 MG tablet  Take 1 tablet (20 mg total) by mouth daily.  . promethazine (PHENERGAN) 12.5 MG tablet Take 12.5 mg by mouth every 6 (six) hours as needed for nausea or vomiting.  . ticagrelor (BRILINTA) 90 MG TABS tablet Take 1 tablet (90 mg total) by mouth 2 (two) times daily.     Allergies:   Patient has no known allergies.   Social History   Social History  . Marital status: Married    Spouse name: N/A  . Number of children: 1  . Years of education: N/A   Occupational History  . Retired    Social History Main Topics  . Smoking status: Former Smoker    Quit date: 06/06/1994  . Smokeless tobacco: Never Used  . Alcohol use No  . Drug use: No  . Sexual activity: Yes   Other Topics Concern  . None   Social History Narrative  . None     Family History: The patient's family history includes Cancer in his sister; Heart attack in his mother; Heart attack (age of onset: 61) in his brother. There is no history of Colon cancer, Stomach cancer, Rectal cancer, or Esophageal cancer. ROS:   Please see the history of present illness.     All other systems reviewed and are negative.  EKGs/Labs/Other Studies Reviewed:    The following studies were reviewed today: Records from [place] , [place]   EKG:  EKG is  ordered today.   Sept. 5, 2018:   Sinus brady at 59.  [facility],  NS ST abn    Recent Labs: 01/19/2017: BUN 18; Creatinine, Ser 1.09; Hemoglobin 11.7; Platelets 177; Potassium 3.8; Sodium 139  Recent Lipid Panel    Component Value Date/Time   CHOL 181 06/23/2009 1147   TRIG 117.0 06/23/2009 1147   HDL 41.40 06/23/2009 1147   CHOLHDL 4 06/23/2009 1147   VLDL 23.4 06/23/2009 1147   LDLCALC 116 (H) 06/23/2009 1147   LDLDIRECT 159.1 12/10/2007 0851    Physical Exam:    VS:  BP 122/76   Pulse (!) 58   Ht 5\' 10"  (1.778 m)   Wt 209 lb 6.4 oz (95 kg)   BMI 30.05 kg/m     Wt Readings from Last 3 Encounters:  02/08/17 209 lb 6.4 oz (95 kg)  01/30/17 208 lb (94.3 kg)  01/19/17 213 lb  13.5 oz (97 kg)  GEN:  Well nourished, well developed in no acute distress HEENT: Normal NECK: No JVD; No carotid bruits LYMPHATICS: No lymphadenopathy CARDIAC: Regular rate, normal S1 and S2. No murmurs. RESPIRATORY:  Clear to auscultation  ABDOMEN: Soft, non-tender, non-distended MUSCULOSKELETAL:  No edema SKIN: Warm and dry NEUROLOGIC:  Alert and oriented x 3 PSYCHIATRIC:  Normal affect   ASSESSMENT:    No diagnosis found. PLAN:    In order of problems listed above:  1. Unstable angina:   Presents following his PCI last month. His angina symptoms are better.  Continue current medications.  Check lipid profile today.  2. Dizziness: [name] has been on a diet and an effort to lose weight. He had cut back on much of his protein and his blood pressure fell. He has stopped his blood pressure medication that was given to him by his medical doctor. He does not recall the name. I've encouraged him to maintain his protein intake even while on a diet. He needs to stay hydrated.  I'll see him again in 6 months.     Medication Adjustments/Labs and Tests Ordered: Current medicines are reviewed at length with the patient today.  Concerns regarding medicines are outlined above.  No orders of the defined types were placed in this encounter.  No orders of the defined types were placed in this encounter.   Signed, [name], MD  02/08/2017 2:05 PM    [name]

## 2017-02-09 ENCOUNTER — Telehealth: Payer: Self-pay | Admitting: *Deleted

## 2017-02-09 DIAGNOSIS — I2510 Atherosclerotic heart disease of native coronary artery without angina pectoris: Secondary | ICD-10-CM

## 2017-02-09 DIAGNOSIS — E785 Hyperlipidemia, unspecified: Secondary | ICD-10-CM

## 2017-02-09 LAB — LIPID PANEL
CHOL/HDL RATIO: 4.1 ratio (ref 0.0–5.0)
CHOLESTEROL TOTAL: 158 mg/dL (ref 100–199)
HDL: 39 mg/dL — ABNORMAL LOW (ref >39)
LDL CALC: 73 mg/dL (ref 0–99)
Triglycerides: 229 mg/dL — ABNORMAL HIGH (ref 0–149)
VLDL CHOLESTEROL CAL: 46 mg/dL — AB (ref 5–40)

## 2017-02-09 MED ORDER — FENOFIBRATE 145 MG PO TABS: 145.0000 mg | ORAL_TABLET | Freq: Every day | ORAL | 3 refills | Status: DC

## 2017-02-09 NOTE — Telephone Encounter (Signed)
Pt has been notified of lab results/findings by phone with verbal understanding. Pt is agreeable to start Fenofibrate 145 mg daily. Pt thanked me for my call. Rx has been sent in today.

## 2017-02-09 NOTE — Telephone Encounter (Signed)
-----   Message from [name], MD sent at 02/09/2017 11:37 AM EDT ----- Chol levels are good.   Trigs are elevated.  Start fenofibrate 145 mg a day . Have him watch his diet and start exercising more

## 2017-02-14 ENCOUNTER — Telehealth (HOSPITAL_COMMUNITY): Payer: Self-pay

## 2017-02-14 NOTE — Telephone Encounter (Signed)
Patient returned call and left message on office voicemail. Patient declined cardiac rehab at this time. Patient is currently exercising at [employer] and patient left message that they do not wish to participate in cardiac rehab. Referral closed.

## 2017-02-14 NOTE — Telephone Encounter (Signed)
I called and left message on voicemail to call office about scheduling for cardiac rehab. I left office contact information on patient voicemail to return call.  ° °

## 2017-02-20 DIAGNOSIS — Z23 Encounter for immunization: Secondary | ICD-10-CM | POA: Diagnosis not present

## 2017-03-21 DIAGNOSIS — E559 Vitamin D deficiency, unspecified: Secondary | ICD-10-CM | POA: Diagnosis not present

## 2017-03-21 DIAGNOSIS — I10 Essential (primary) hypertension: Secondary | ICD-10-CM | POA: Diagnosis not present

## 2017-03-21 DIAGNOSIS — E1122 Type 2 diabetes mellitus with diabetic chronic kidney disease: Secondary | ICD-10-CM | POA: Diagnosis not present

## 2017-03-21 DIAGNOSIS — Z125 Encounter for screening for malignant neoplasm of prostate: Secondary | ICD-10-CM | POA: Diagnosis not present

## 2017-03-21 DIAGNOSIS — E785 Hyperlipidemia, unspecified: Secondary | ICD-10-CM | POA: Diagnosis not present

## 2017-03-27 DIAGNOSIS — Z961 Presence of intraocular lens: Secondary | ICD-10-CM | POA: Diagnosis not present

## 2017-03-27 DIAGNOSIS — H04123 Dry eye syndrome of bilateral lacrimal glands: Secondary | ICD-10-CM | POA: Diagnosis not present

## 2017-03-27 DIAGNOSIS — H538 Other visual disturbances: Secondary | ICD-10-CM | POA: Diagnosis not present

## 2017-03-27 DIAGNOSIS — H31011 Macula scars of posterior pole (postinflammatory) (post-traumatic), right eye: Secondary | ICD-10-CM | POA: Diagnosis not present

## 2017-03-28 DIAGNOSIS — E119 Type 2 diabetes mellitus without complications: Secondary | ICD-10-CM | POA: Diagnosis not present

## 2017-03-28 DIAGNOSIS — F329 Major depressive disorder, single episode, unspecified: Secondary | ICD-10-CM | POA: Diagnosis not present

## 2017-03-28 DIAGNOSIS — M25562 Pain in left knee: Secondary | ICD-10-CM | POA: Diagnosis not present

## 2017-03-28 DIAGNOSIS — I2510 Atherosclerotic heart disease of native coronary artery without angina pectoris: Secondary | ICD-10-CM | POA: Diagnosis not present

## 2017-03-28 DIAGNOSIS — G8929 Other chronic pain: Secondary | ICD-10-CM | POA: Diagnosis not present

## 2017-03-28 DIAGNOSIS — I10 Essential (primary) hypertension: Secondary | ICD-10-CM | POA: Diagnosis not present

## 2017-03-28 DIAGNOSIS — K219 Gastro-esophageal reflux disease without esophagitis: Secondary | ICD-10-CM | POA: Diagnosis not present

## 2017-03-28 DIAGNOSIS — E7800 Pure hypercholesterolemia, unspecified: Secondary | ICD-10-CM | POA: Diagnosis not present

## 2017-03-28 DIAGNOSIS — M25561 Pain in right knee: Secondary | ICD-10-CM | POA: Diagnosis not present

## 2017-03-28 DIAGNOSIS — R790 Abnormal level of blood mineral: Secondary | ICD-10-CM | POA: Diagnosis not present

## 2017-04-20 ENCOUNTER — Ambulatory Visit: Payer: Medicare Other | Admitting: Cardiovascular Disease

## 2017-06-21 DIAGNOSIS — E119 Type 2 diabetes mellitus without complications: Secondary | ICD-10-CM | POA: Diagnosis not present

## 2017-07-05 DIAGNOSIS — I2510 Atherosclerotic heart disease of native coronary artery without angina pectoris: Secondary | ICD-10-CM | POA: Diagnosis not present

## 2017-07-05 DIAGNOSIS — E1122 Type 2 diabetes mellitus with diabetic chronic kidney disease: Secondary | ICD-10-CM | POA: Diagnosis not present

## 2017-07-05 DIAGNOSIS — N182 Chronic kidney disease, stage 2 (mild): Secondary | ICD-10-CM | POA: Diagnosis not present

## 2017-08-18 ENCOUNTER — Ambulatory Visit (INDEPENDENT_AMBULATORY_CARE_PROVIDER_SITE_OTHER): Payer: Medicare Other | Admitting: Cardiovascular Disease

## 2017-08-18 ENCOUNTER — Encounter: Payer: Self-pay | Admitting: Cardiovascular Disease

## 2017-08-18 VITALS — BP 126/74 | HR 57 | Ht 70.0 in | Wt 206.8 lb

## 2017-08-18 DIAGNOSIS — I5032 Chronic diastolic (congestive) heart failure: Secondary | ICD-10-CM | POA: Diagnosis not present

## 2017-08-18 DIAGNOSIS — E782 Mixed hyperlipidemia: Secondary | ICD-10-CM | POA: Diagnosis not present

## 2017-08-18 DIAGNOSIS — I2510 Atherosclerotic heart disease of native coronary artery without angina pectoris: Secondary | ICD-10-CM | POA: Diagnosis not present

## 2017-08-18 LAB — HEPATIC FUNCTION PANEL
ALK PHOS: 55 IU/L (ref 39–117)
ALT: 14 IU/L (ref 0–44)
AST: 15 IU/L (ref 0–40)
Albumin: 4 g/dL (ref 3.5–4.8)
Bilirubin Total: 0.5 mg/dL (ref 0.0–1.2)
Bilirubin, Direct: 0.17 mg/dL (ref 0.00–0.40)
Total Protein: 6.2 g/dL (ref 6.0–8.5)

## 2017-08-18 LAB — BASIC METABOLIC PANEL
BUN / CREAT RATIO: 19 (ref 10–24)
BUN: 22 mg/dL (ref 8–27)
CO2: 25 mmol/L (ref 20–29)
CREATININE: 1.14 mg/dL (ref 0.76–1.27)
Calcium: 9.1 mg/dL (ref 8.6–10.2)
Chloride: 105 mmol/L (ref 96–106)
GFR calc Af Amer: 72 mL/min/{1.73_m2} (ref >59)
GFR, EST NON AFRICAN AMERICAN: 63 mL/min/{1.73_m2} (ref >59)
GLUCOSE: 108 mg/dL — AB (ref 65–99)
POTASSIUM: 4.5 mmol/L (ref 3.5–5.2)
SODIUM: 143 mmol/L (ref 134–144)

## 2017-08-18 LAB — LIPID PANEL
CHOLESTEROL TOTAL: 172 mg/dL (ref 100–199)
Chol/HDL Ratio: 3.7 ratio (ref 0.0–5.0)
HDL: 47 mg/dL (ref >39)
LDL CALC: 109 mg/dL — AB (ref 0–99)
Triglycerides: 82 mg/dL (ref 0–149)
VLDL CHOLESTEROL CAL: 16 mg/dL (ref 5–40)

## 2017-08-18 NOTE — Progress Notes (Signed)
Cardiology Office Note:    Date:  08/18/2017   ID:  [name], DOB [birth year], MRN [id number]  PCP:  [name], MD  Cardiologist:  [name], MD    Referring MD: [name], MD   Chief Complaint  Patient presents with  . Coronary Artery Disease    History of Present Illness:    [name] is a 77 y.o. male with a hx of Coronary artery disease been years ago. He is a previous patient of Dr. Bing Quarry. He presents now with symptoms of exertional chest pain and shortness breath.  He has shortness of breath at night or during the day  Walking 5-6 minutes causes significant dyspnea.  Also has claudication in his legs with walking   Has occasional chest pain  - usually when he is at rest. Has chest squeezing. - occurs randomly  Has lots of indigestion - occurs randomly   Also has lots of sweating - not necessarliy related to his chest tightness  does not feel similar to his previous MI pain .   No CP since that time   Hx of MI in 1996 -  Had stent by [name] Stopped smoking at that time   Does not exercise regularly    .   Works out in the yard.   Sept. 5, 2018: [name] is seen today for follow up visit.   S/p recent stenting of his prox - mid LAD  Hx of CAD , stenting by [name] in the past .   PTs BP has been low Has been on a diet and has been cutting back on his protein and his food in general .  Has lost 8.5 lbs   Had an episode of CP at night.   Better after NTG . Still fairly active .   Walks a little ,  Just started a walking program .   Walks 1/2 mile a day without CP  His medical doctor had recently started him on a BP med and he has stopped it now (does not remember the name of it)   Echo Aug. 28 shows normal LV function   August 18, 2017: [name] is seen today for follow-up of his coronary artery disease. He has a history of coronary artery disease and is status post stent placement to the left anterior descending artery in August,  2018 Overall he is doing very well.  Is not had any episodes of angina since his PCI in August, 2018.  He does have some shortness of breath with exertion.  He is exercising fairly regularly.  Wife is had problems and he has had to help her.  He is lost some weight since his angioplasty.    His last lipid level shows a triglyceride level of 229.  His total cholesterol is 158.  HDL is 39.  LDL 73.  Past Medical History:  Diagnosis Date  . Adenomatous colon polyp   . Arthritis   . CAD (coronary artery disease)    a. MI in 1996 with stent placement b. cath 01/18/17 - S/p PCI of in-stent restenosis of pLAD with cutting ballon & DES; 20% ostial LAD; 40% pro Cx; 60% focal pRCA  . Clostridium difficile infection   . Depression   . Diverticulosis   . H. pylori infection   . HTN (hypertension)   . Hyperlipidemia   . MI (myocardial infarction) [facility])     Past Surgical History:  Procedure Laterality Date  . BRAIN TUMOR EXCISION  1954   benign tumor in back of head, done at  [facility]  . CARDIAC CATHETERIZATION    . COLONOSCOPY     hx of polyps  . [place]; 1997; 01/18/2017  . CORONARY STENT INTERVENTION N/A 01/18/2017   Procedure: CORONARY STENT INTERVENTION;  Surgeon: Troy Sine, MD;  Location: [place] [facility];  Service: Cardiovascular;  Laterality: N/A;  . JOINT REPLACEMENT    . LEFT HEART CATH AND CORONARY ANGIOGRAPHY N/A 01/18/2017   Procedure: LEFT HEART CATH AND CORONARY ANGIOGRAPHY;  Surgeon: Troy Sine, MD;  Location: [place] [facility];  Service: Cardiovascular;  Laterality: N/A;  . stent x2 in the LAD  with intra-aortic balloon pump support  1996  . TONSILLECTOMY    . TOTAL KNEE ARTHROPLASTY Bilateral     Current Medications: Current Meds  Medication Sig  . aspirin 81 MG chewable tablet Chew 1 tablet (81 mg total) by mouth daily.  Marland Kitchen atorvastatin (LIPITOR) 80 MG tablet Take 80 mg by mouth daily.    . fenofibrate (TRICOR) 145 MG  tablet Take 1 tablet (145 mg total) by mouth daily.  Marland Kitchen FLUoxetine (PROZAC) 40 MG capsule Take 40 mg by mouth daily.  . furosemide (LASIX) 20 MG tablet Take 20 mg by mouth daily as needed for fluid or edema.  Marland Kitchen HYDROcodone-acetaminophen (NORCO) 7.5-325 MG tablet Take 1 tablet by mouth daily as needed. For pain.  . isosorbide mononitrate (IMDUR) 30 MG 24 hr tablet Take 1 tablet (30 mg total) by mouth daily.  . metoprolol tartrate (LOPRESSOR) 25 MG tablet Take 0.5 tablets (12.5 mg total) by mouth 2 (two) times daily.  . nitroGLYCERIN (NITROSTAT) 0.4 MG SL tablet Place 1 tablet (0.4 mg total) under the tongue every 5 (five) minutes as needed for chest pain.  . pantoprazole (PROTONIX) 20 MG tablet Take 1 tablet (20 mg total) by mouth daily.  . promethazine (PHENERGAN) 12.5 MG tablet Take 12.5 mg by mouth every 6 (six) hours as needed for nausea or vomiting.  . ticagrelor (BRILINTA) 90 MG TABS tablet Take 1 tablet (90 mg total) by mouth 2 (two) times daily.     Allergies:   Patient has no known allergies.   Social History   Socioeconomic History  . Marital status: Married    Spouse name: None  . Number of children: 1  . Years of education: None  . Highest education level: None  Social Needs  . Financial resource strain: None  . Food insecurity - worry: None  . Food insecurity - inability: None  . Transportation needs - medical: None  . Transportation needs - non-medical: None  Occupational History  . Occupation: Retired  Tobacco Use  . Smoking status: Former Smoker    Last attempt to quit: 06/06/1994    Years since quitting: 23.2  . Smokeless tobacco: Never Used  Substance and Sexual Activity  . Alcohol use: No  . Drug use: No  . Sexual activity: Yes  Other Topics Concern  . None  Social History Narrative  . None     Family History: The patient's family history includes Cancer in his sister; Heart attack in his mother; Heart attack (age of onset: 64) in his brother. There is no  history of Colon cancer, Stomach cancer, Rectal cancer, or Esophageal cancer. ROS:   Please see the history of present illness.     All other systems reviewed and are negative.  EKGs/Labs/Other Studies Reviewed:  EKG:      Recent Labs: 01/19/2017: BUN 18; Creatinine, Ser 1.09; Hemoglobin 11.7; Platelets 177; Potassium 3.8; Sodium 139  Recent Lipid Panel    Component Value Date/Time   CHOL 158 02/08/2017 1425   TRIG 229 (H) 02/08/2017 1425   HDL 39 (L) 02/08/2017 1425   CHOLHDL 4.1 02/08/2017 1425   CHOLHDL 4 06/23/2009 1147   VLDL 23.4 06/23/2009 1147   LDLCALC 73 02/08/2017 1425   LDLDIRECT 159.1 12/10/2007 0851    Physical Exam: Blood pressure 126/74, pulse (!) 57, height 5\' 10"  (1.778 m), weight 206 lb 12.8 oz (93.8 kg), SpO2 94 %.  GEN:  Well nourished, well developed in no acute distress HEENT: Normal NECK: No JVD; No carotid bruits LYMPHATICS: No lymphadenopathy CARDIAC: RR, no murmurs, rubs, gallops RESPIRATORY:  Clear to auscultation without rales, wheezing or rhonchi  ABDOMEN: Soft, non-tender, non-distended MUSCULOSKELETAL:  No edema; No deformity  SKIN: Warm and dry NEUROLOGIC:  Alert and oriented x 3    ASSESSMENT:    No diagnosis found. PLAN:    In order of problems listed above:  1. Coronary artery disease: All remains very stable.  Is not had any symptoms of angina recently.  He does have some shortness of breath with exertion.  Continue current medications.  Continue aspirin and Brilinta until August, 2019.  At that point he can stop Brilinta and continue aspirin.  2.  Chronic diastolic congestive heart failure.  He has grade 1 diastolic dysfunction.  Continue with exercise and weight loss efforts.  His blood pressure is well controlled.      3.  Hyperlipidemia: Continue atorvastatin 80 mg a day.  We will check labs today.  I'll see him again in 6 months.     Medication Adjustments/Labs and Tests Ordered: Current medicines are reviewed  at length with the patient today.  Concerns regarding medicines are outlined above.  No orders of the defined types were placed in this encounter.  No orders of the defined types were placed in this encounter.   Signed, [name], MD  08/18/2017 9:04 AM    [place]

## 2017-08-18 NOTE — Patient Instructions (Signed)
Medication Instructions:  Your physician recommends that you continue on your current medications as directed. Please refer to the Current Medication list given to you today.   Labwork: TODAY - cholesterol, liver panel, basic metabolic panel   Testing/Procedures: None Ordered   Follow-Up: Your physician wants you to follow-up in: 6 months with a PA or Nurse Practitioner on Dr. Elmarie Shiley team.  You will receive a reminder letter in the mail two months in advance. If you don't receive a letter, please call our office to schedule the follow-up appointment.   If you need a refill on your cardiac medications before your next appointment, please call your pharmacy.   Thank you for choosing [facility]! Christen Bame, RN [phone]

## 2017-08-21 ENCOUNTER — Telehealth: Payer: Self-pay | Admitting: Nurse Practitioner

## 2017-08-21 DIAGNOSIS — E782 Mixed hyperlipidemia: Secondary | ICD-10-CM

## 2017-08-21 MED ORDER — EZETIMIBE 10 MG PO TABS: 10.0000 mg | ORAL_TABLET | Freq: Every day | ORAL | 3 refills | Status: DC

## 2017-08-21 NOTE — Telephone Encounter (Signed)
-----   Message from [name], MD sent at 08/18/2017  5:27 PM EDT ----- LDL is higher.  If he still taking the same dose of statin.  If so, but have him work on a better diet and exercise program.  Add Zetia 10 mg a day. Recheck labs in 3 months.

## 2017-08-21 NOTE — Telephone Encounter (Signed)
Reviewed results with patient. He reports compliance with medications. I advised him of improvement with triglyceride level and encouraged him to continue good diet. He verbalized agreement to start Zetia 10 mg once daily in addition to Atorvastatin and Fenofibrate. I scheduled his repeat lab appointment for June 19 and advised him to call back with questions or concerns prior to follow-up. He thanked me for the call.

## 2017-09-04 DIAGNOSIS — T1502XA Foreign body in cornea, left eye, initial encounter: Secondary | ICD-10-CM | POA: Diagnosis not present

## 2017-10-23 ENCOUNTER — Other Ambulatory Visit: Payer: Self-pay | Admitting: Physician Assistant

## 2017-11-22 ENCOUNTER — Other Ambulatory Visit: Payer: Medicare Other

## 2017-11-22 DIAGNOSIS — N182 Chronic kidney disease, stage 2 (mild): Secondary | ICD-10-CM | POA: Diagnosis not present

## 2017-11-22 DIAGNOSIS — I2510 Atherosclerotic heart disease of native coronary artery without angina pectoris: Secondary | ICD-10-CM | POA: Diagnosis not present

## 2017-11-22 DIAGNOSIS — E1122 Type 2 diabetes mellitus with diabetic chronic kidney disease: Secondary | ICD-10-CM | POA: Diagnosis not present

## 2017-11-29 DIAGNOSIS — N182 Chronic kidney disease, stage 2 (mild): Secondary | ICD-10-CM | POA: Diagnosis not present

## 2017-11-29 DIAGNOSIS — F419 Anxiety disorder, unspecified: Secondary | ICD-10-CM | POA: Diagnosis not present

## 2017-11-29 DIAGNOSIS — I209 Angina pectoris, unspecified: Secondary | ICD-10-CM | POA: Diagnosis not present

## 2017-11-29 DIAGNOSIS — I2510 Atherosclerotic heart disease of native coronary artery without angina pectoris: Secondary | ICD-10-CM | POA: Diagnosis not present

## 2017-11-29 DIAGNOSIS — E1122 Type 2 diabetes mellitus with diabetic chronic kidney disease: Secondary | ICD-10-CM | POA: Diagnosis not present

## 2017-11-29 DIAGNOSIS — F329 Major depressive disorder, single episode, unspecified: Secondary | ICD-10-CM | POA: Diagnosis not present

## 2017-11-29 DIAGNOSIS — E785 Hyperlipidemia, unspecified: Secondary | ICD-10-CM | POA: Diagnosis not present

## 2017-12-12 DIAGNOSIS — H538 Other visual disturbances: Secondary | ICD-10-CM | POA: Diagnosis not present

## 2017-12-12 DIAGNOSIS — H18892 Other specified disorders of cornea, left eye: Secondary | ICD-10-CM | POA: Diagnosis not present

## 2017-12-12 DIAGNOSIS — Z961 Presence of intraocular lens: Secondary | ICD-10-CM | POA: Diagnosis not present

## 2017-12-12 DIAGNOSIS — H04123 Dry eye syndrome of bilateral lacrimal glands: Secondary | ICD-10-CM | POA: Diagnosis not present

## 2017-12-18 DIAGNOSIS — H538 Other visual disturbances: Secondary | ICD-10-CM | POA: Diagnosis not present

## 2017-12-18 DIAGNOSIS — Z961 Presence of intraocular lens: Secondary | ICD-10-CM | POA: Diagnosis not present

## 2018-01-02 DIAGNOSIS — H538 Other visual disturbances: Secondary | ICD-10-CM | POA: Diagnosis not present

## 2018-01-02 DIAGNOSIS — Z961 Presence of intraocular lens: Secondary | ICD-10-CM | POA: Diagnosis not present

## 2018-01-10 DIAGNOSIS — M1612 Unilateral primary osteoarthritis, left hip: Secondary | ICD-10-CM | POA: Diagnosis not present

## 2018-01-10 DIAGNOSIS — F419 Anxiety disorder, unspecified: Secondary | ICD-10-CM | POA: Diagnosis not present

## 2018-01-10 DIAGNOSIS — M25552 Pain in left hip: Secondary | ICD-10-CM | POA: Diagnosis not present

## 2018-01-10 DIAGNOSIS — F329 Major depressive disorder, single episode, unspecified: Secondary | ICD-10-CM | POA: Diagnosis not present

## 2018-02-12 DIAGNOSIS — Z23 Encounter for immunization: Secondary | ICD-10-CM | POA: Diagnosis not present

## 2018-03-14 ENCOUNTER — Encounter

## 2018-03-14 ENCOUNTER — Ambulatory Visit: Payer: Medicare Other | Admitting: Physician Assistant

## 2018-04-03 DIAGNOSIS — I10 Essential (primary) hypertension: Secondary | ICD-10-CM | POA: Diagnosis not present

## 2018-04-03 DIAGNOSIS — N182 Chronic kidney disease, stage 2 (mild): Secondary | ICD-10-CM | POA: Diagnosis not present

## 2018-04-03 DIAGNOSIS — E785 Hyperlipidemia, unspecified: Secondary | ICD-10-CM | POA: Diagnosis not present

## 2018-04-03 DIAGNOSIS — I2510 Atherosclerotic heart disease of native coronary artery without angina pectoris: Secondary | ICD-10-CM | POA: Diagnosis not present

## 2018-04-03 DIAGNOSIS — E1122 Type 2 diabetes mellitus with diabetic chronic kidney disease: Secondary | ICD-10-CM | POA: Diagnosis not present

## 2018-04-04 ENCOUNTER — Other Ambulatory Visit: Payer: Self-pay | Admitting: Cardiovascular Disease

## 2018-04-11 ENCOUNTER — Encounter: Payer: Self-pay | Admitting: Physician Assistant

## 2018-04-12 ENCOUNTER — Encounter: Payer: Self-pay | Admitting: Physician Assistant

## 2018-04-12 ENCOUNTER — Ambulatory Visit (INDEPENDENT_AMBULATORY_CARE_PROVIDER_SITE_OTHER): Payer: Medicare Other | Admitting: Physician Assistant

## 2018-04-12 VITALS — BP 118/78 | HR 61 | Ht 70.0 in | Wt 217.8 lb

## 2018-04-12 DIAGNOSIS — F321 Major depressive disorder, single episode, moderate: Secondary | ICD-10-CM | POA: Diagnosis not present

## 2018-04-12 DIAGNOSIS — E1121 Type 2 diabetes mellitus with diabetic nephropathy: Secondary | ICD-10-CM | POA: Diagnosis not present

## 2018-04-12 DIAGNOSIS — I129 Hypertensive chronic kidney disease with stage 1 through stage 4 chronic kidney disease, or unspecified chronic kidney disease: Secondary | ICD-10-CM | POA: Diagnosis not present

## 2018-04-12 DIAGNOSIS — E785 Hyperlipidemia, unspecified: Secondary | ICD-10-CM

## 2018-04-12 DIAGNOSIS — R252 Cramp and spasm: Secondary | ICD-10-CM

## 2018-04-12 DIAGNOSIS — I2510 Atherosclerotic heart disease of native coronary artery without angina pectoris: Secondary | ICD-10-CM

## 2018-04-12 DIAGNOSIS — I200 Unstable angina: Secondary | ICD-10-CM | POA: Diagnosis not present

## 2018-04-12 DIAGNOSIS — I5022 Chronic systolic (congestive) heart failure: Secondary | ICD-10-CM | POA: Diagnosis not present

## 2018-04-12 DIAGNOSIS — I5043 Acute on chronic combined systolic (congestive) and diastolic (congestive) heart failure: Secondary | ICD-10-CM | POA: Diagnosis not present

## 2018-04-12 DIAGNOSIS — R269 Unspecified abnormalities of gait and mobility: Secondary | ICD-10-CM | POA: Diagnosis not present

## 2018-04-12 DIAGNOSIS — Z0000 Encounter for general adult medical examination without abnormal findings: Secondary | ICD-10-CM | POA: Diagnosis not present

## 2018-04-12 DIAGNOSIS — E1122 Type 2 diabetes mellitus with diabetic chronic kidney disease: Secondary | ICD-10-CM | POA: Diagnosis not present

## 2018-04-12 LAB — LIPID PANEL
Chol/HDL Ratio: 3.1 ratio (ref 0.0–5.0)
Cholesterol, Total: 148 mg/dL (ref 100–199)
HDL: 47 mg/dL (ref >39)
LDL Calculated: 88 mg/dL (ref 0–99)
Triglycerides: 65 mg/dL (ref 0–149)
VLDL Cholesterol Cal: 13 mg/dL (ref 5–40)

## 2018-04-12 LAB — BASIC METABOLIC PANEL
BUN / CREAT RATIO: 18 (ref 10–24)
BUN: 23 mg/dL (ref 8–27)
CHLORIDE: 103 mmol/L (ref 96–106)
CO2: 26 mmol/L (ref 20–29)
Calcium: 9.5 mg/dL (ref 8.6–10.2)
Creatinine, Ser: 1.3 mg/dL — ABNORMAL HIGH (ref 0.76–1.27)
GFR calc Af Amer: 62 mL/min/{1.73_m2} (ref >59)
GFR calc non Af Amer: 53 mL/min/{1.73_m2} — ABNORMAL LOW (ref >59)
GLUCOSE: 106 mg/dL — AB (ref 65–99)
Potassium: 4.7 mmol/L (ref 3.5–5.2)
SODIUM: 145 mmol/L — AB (ref 134–144)

## 2018-04-12 LAB — HEPATIC FUNCTION PANEL
ALBUMIN: 4.3 g/dL (ref 3.5–4.8)
ALT: 14 IU/L (ref 0–44)
AST: 20 IU/L (ref 0–40)
Alkaline Phosphatase: 44 IU/L (ref 39–117)
BILIRUBIN TOTAL: 0.6 mg/dL (ref 0.0–1.2)
BILIRUBIN, DIRECT: 0.2 mg/dL (ref 0.00–0.40)
TOTAL PROTEIN: 6.5 g/dL (ref 6.0–8.5)

## 2018-04-12 LAB — MAGNESIUM: MAGNESIUM: 1.9 mg/dL (ref 1.6–2.3)

## 2018-04-12 MED ORDER — CLOPIDOGREL BISULFATE 75 MG PO TABS: 75.0000 mg | ORAL_TABLET | Freq: Every day | ORAL | 3 refills | Status: DC

## 2018-04-12 NOTE — Progress Notes (Signed)
Cardiology Office Note    Date:  04/12/2018   ID:  [name], DOB Jul 09, 1940, MRN [id number]  PCP:  [name], MD  Cardiologist: Dr. Acie Fredrickson  Chief Complaint: 3 Months follow up  History of Present Illness:   [name] is a 77 y.o. male with hx of CAD, HTN, chronic diastolic CHF and HLD present for follow up.   Hx of CAD with MI in 1996. Last cath 01/18/17 - S/p PCI of in-stent restenosis of pLAD with cutting ballon & DES; 20% ostial LAD; 40% pro Cx; 60% focal pRCA. He wad doing well on cardiac stand point when last seen by Dr. Liana Crocker 08/2017.  Here today for follow up. He stopped his Brillinta 12/2017 due to easy bruising >> now improved. Also cost is an issue. He takes care of his wife. Fee weeks ago had episode of substernal chest pain. Different than prior angina. Resolved in 30 minutes after 1 SL nitro. No radiation, dyspnea, nausea or diaphoresis. No occurrence. This is first time he required nitro this year. No orthopnea, PND, syncope or LE edema. Has mild leg cramp (rare intermittent) which improves with walking.  Past Medical History:  Diagnosis Date  . Adenomatous colon polyp   . Arthritis   . CAD (coronary artery disease)    a. MI in 1996 with stent placement b. cath 01/18/17 - S/p PCI of in-stent restenosis of pLAD with cutting ballon & DES; 20% ostial LAD; 40% pro Cx; 60% focal pRCA  . Clostridium difficile infection   . Depression   . Diverticulosis   . H. pylori infection   . HTN (hypertension)   . Hyperlipidemia   . MI (myocardial infarction) [facility])     Past Surgical History:  Procedure Laterality Date  . BRAIN TUMOR EXCISION  1954   benign tumor in back of head, done at  [facility]  . CARDIAC CATHETERIZATION    . COLONOSCOPY     hx of polyps  . [place]; 1997; 01/18/2017  . CORONARY STENT INTERVENTION N/A 01/18/2017   Procedure: CORONARY STENT INTERVENTION;  Surgeon: Troy Sine, MD;  Location: [place] [facility];  Service: Cardiovascular;  Laterality: N/A;  . JOINT REPLACEMENT    . LEFT HEART CATH AND CORONARY ANGIOGRAPHY N/A 01/18/2017   Procedure: LEFT HEART CATH AND CORONARY ANGIOGRAPHY;  Surgeon: Troy Sine, MD;  Location: [place] [facility];  Service: Cardiovascular;  Laterality: N/A;  . stent x2 in the LAD  with intra-aortic balloon pump support  1996  . TONSILLECTOMY    . TOTAL KNEE ARTHROPLASTY Bilateral     Current Medications: Prior to Admission medications   Medication Sig Start Date End Date Taking? Authorizing Provider  aspirin 81 MG chewable tablet Chew 1 tablet (81 mg total) by mouth daily. 01/20/17   Delos Haring, PA-C  atorvastatin (LIPITOR) 80 MG tablet Take 80 mg by mouth daily.      [provider]  esomeprazole (NEXIUM) 40 MG capsule TAKE 1 CAPSULE BY MOUTH TWICE A DAY BEFORE A MEAL 10/24/17   Milus Banister, MD  ezetimibe (ZETIA) 10 MG tablet Take 1 tablet (10 mg total) by mouth daily. 08/21/17 11/19/17  Nahser, Wonda Cheng, MD  fenofibrate (TRICOR) 145 MG tablet TAKE 1 TABLET BY MOUTH EVERY DAY 04/04/18   Nahser, Wonda Cheng, MD  FLUoxetine (PROZAC) 40 MG capsule Take 40 mg by mouth daily.    [provider]  furosemide (LASIX)  20 MG tablet Take 20 mg by mouth daily as needed for fluid or edema.    [provider]  HYDROcodone-acetaminophen (NORCO) 7.5-325 MG tablet Take 1 tablet by mouth daily as needed. For pain. 01/17/17   [provider]  isosorbide mononitrate (IMDUR) 30 MG 24 hr tablet Take 1 tablet (30 mg total) by mouth daily. 01/20/17   Delos Haring, PA-C  metoprolol tartrate (LOPRESSOR) 25 MG tablet Take 0.5 tablets (12.5 mg total) by mouth 2 (two) times daily. 01/19/17   Delos Haring, PA-C  nitroGLYCERIN (NITROSTAT) 0.4 MG SL tablet Place 1 tablet (0.4 mg total) under the tongue every 5 (five) minutes as needed for chest pain. 01/19/17   Delos Haring, PA-C  pantoprazole (PROTONIX) 20 MG tablet Take 1 tablet (20 mg total) by  mouth daily. 01/19/17 01/19/18  Delos Haring, PA-C  promethazine (PHENERGAN) 12.5 MG tablet Take 12.5 mg by mouth every 6 (six) hours as needed for nausea or vomiting.    [provider]  ticagrelor (BRILINTA) 90 MG TABS tablet Take 1 tablet (90 mg total) by mouth 2 (two) times daily. 01/19/17   Delos Haring, PA-C    Allergies:   Patient has no known allergies.   Social History   Socioeconomic History  . Marital status: Married    Spouse name: Not on file  . Number of children: 1  . Years of education: Not on file  . Highest education level: Not on file  Occupational History  . Occupation: Retired  Scientific laboratory technician  . Financial resource strain: Not on file  . Food insecurity:    Worry: Not on file    Inability: Not on file  . Transportation needs:    Medical: Not on file    Non-medical: Not on file  Tobacco Use  . Smoking status: Former Smoker    Last attempt to quit: 06/06/1994    Years since quitting: 23.8  . Smokeless tobacco: Never Used  Substance and Sexual Activity  . Alcohol use: No  . Drug use: No  . Sexual activity: Yes  Lifestyle  . Physical activity:    Days per week: Not on file    Minutes per session: Not on file  . Stress: Not on file  Relationships  . Social connections:    Talks on phone: Not on file    Gets together: Not on file    Attends religious service: Not on file    Active member of club or organization: Not on file    Attends meetings of clubs or organizations: Not on file    Relationship status: Not on file  Other Topics Concern  . Not on file  Social History Narrative  . Not on file     Family History:  The patient's family history includes Cancer in his sister; Heart attack in his mother; Heart attack (age of onset: 47) in his brother.   ROS:   Please see the history of present illness.    ROS All other systems reviewed and are negative.   PHYSICAL EXAM:   VS:  BP 118/78   Pulse 61   Ht 5\' 10"  (1.778 m)   Wt 217 lb 12.8  oz (98.8 kg)   SpO2 96%   BMI 31.25 kg/m    GEN: Well nourished, well developed, in no acute distress  HEENT: normal  Neck: no JVD, carotid bruits, or masses Cardiac: RRR; no murmurs, rubs, or gallops,no edema  Respiratory:  clear to auscultation bilaterally, normal work  of breathing GI: soft, nontender, nondistended, + BS MS: no deformity or atrophy  Skin: warm and dry, no rash Neuro:  Alert and Oriented x 3, Strength and sensation are intact Psych: euthymic mood, full affect  Wt Readings from Last 3 Encounters:  04/12/18 217 lb 12.8 oz (98.8 kg)  08/18/17 206 lb 12.8 oz (93.8 kg)  02/08/17 209 lb 6.4 oz (95 kg)      Studies/Labs Reviewed:   EKG:  EKG is ordered today.  The ekg ordered today demonstrates NSR  Recent Labs: 08/18/2017: ALT 14; BUN 22; Creatinine, Ser 1.14; Potassium 4.5; Sodium 143   Lipid Panel    Component Value Date/Time   CHOL 172 08/18/2017 0924   TRIG 82 08/18/2017 0924   HDL 47 08/18/2017 0924   CHOLHDL 3.7 08/18/2017 0924   CHOLHDL 4 06/23/2009 1147   VLDL 23.4 06/23/2009 1147   LDLCALC 109 (H) 08/18/2017 0924   LDLDIRECT 159.1 12/10/2007 0851    Additional studies/ records that were reviewed today include:   Echocardiogram:  01/31/17 Study Conclusions  - Procedure narrative: Transthoracic echocardiography. Image   quality was suboptimal. The study was technically difficult.   Intravenous contrast (Definity) was administered. - Left ventricle: The cavity size was normal. Wall thickness was   normal. The estimated ejection fraction was 55%. Although no   diagnostic regional wall motion abnormality was identified, this   possibility cannot be completely excluded on the basis of this   study. Doppler parameters are consistent with abnormal left   ventricular relaxation (grade 1 diastolic dysfunction). - Aortic valve: There was no stenosis. - Mitral valve: There was no significant regurgitation. - Right ventricle: The cavity size was  normal. Systolic function   was normal. - Pulmonary arteries: No complete TR doppler jet so unable to   estimate PA systolic pressure.  Impressions:  - Technically difficult study with poor acoustic windows. Normal LV   size with EF 55%. Normal RV size and systolic function. No   significant valvular abnormalities.  CORONARY STENT INTERVENTION  01/18/17  LEFT HEART CATH AND CORONARY ANGIOGRAPHY  Conclusion     Ost LAD lesion, 20 %stenosed.  Mid LAD lesion, 90 %stenosed.  Dist LAD lesion, 50 %stenosed.  Prox Cx to Mid Cx lesion, 40 %stenosed.  Ost 2nd Mrg to 2nd Mrg lesion, 25 %stenosed.  Ost RCA lesion, 65 %stenosed.  A STENT RESOLUTE ONYX 3.0X26 drug eluting stent was successfully placed, and overlaps previously placed stent.  Ost LAD to Mid LAD lesion, 30 %stenosed.  Post intervention, there is a 0% residual stenosis.  Mid RCA to Dist RCA lesion, 15 %stenosed.  There is mild left ventricular systolic dysfunction.  The left ventricular ejection fraction is 45-50% by visual estimate.   Multivessel CAD with 20% ostial LAD stenosis prior to previously placed tandem LAD stents with 30% diffuse narrowing in the stented segment and focal 85-90% in-stent restenosis in the midportion of the stent and 50% mid LAD stenosis; 40% proximal circumflex stenosis with mild luminal irregularity in the mid AV groove circumflex.;  65% focal eccentric proximal RCA stenosis with mild luminal irregularity in the mid RCA proximal to the acute margin.  Successful percutaneous coronary intervention for in-stent restenosis of the LAD with cutting balloon followed by insertion of a 3.026 mm Resolute Onyx DES stent postdilated 3.25 mm with the in-stent restenosis being reduced to 0%.  Mild LV dysfunction with focal mid-distal anterolateral hypocontractility an ejection fraction of 45%.  RECOMMENDATION: The patient will continue  a dual antiplatelet therapy for minimum of 1 year.  Concomitant  medical therapy will be added for his CAD with nitrates and beta blocker therapy.  High potency statin therapy will be initiated.  Consider initiation of ACE-I/ARB therapy.        ASSESSMENT & PLAN:    1. CAD - One episode of chest pain which resolved after SL nitro x 1. No reoccurrence. His symptoms were different than prior angina. He has stopped his Brillinta 12/2017 (last PCI 01/2017) due to cost and easy brushing. Reviewed with Dr. Acie Fredrickson. Stop ASA and Brillinta. Start Plavix. If recurrent symptoms, will consider stress test otherwise continue current medication. EKG without acute changes.   2. HLD - Recheck labs today. On Lipitor 80mg  daily, Zetia 10mg  daily and Tricor 145mg  daily. Mild leg cramp (intermittent). improves with walking.  No change in therapy for now.   3. Chronic diastolic CHF - Asymptomatic. Continue lasix. Euvolemic.     Medication Adjustments/Labs and Tests Ordered: Current medicines are reviewed at length with the patient today.  Concerns regarding medicines are outlined above.  Medication changes, Labs and Tests ordered today are listed in the Patient Instructions below. Patient Instructions  Medication Instructions:  1. STOP ASPIRIN  2. STOP BRILINTA   3. START PLAVIX 75 MG DAILY BEGINNING TOMORROW Friday 04/13/18 If you need a refill on your cardiac medications before your next appointment, please call your pharmacy.   Lab work: TODAY BMET, MAGNESIUM LEVEL, LIPID, LFT  If you have labs (blood work) drawn today and your tests are completely normal, you will receive your results only by: Marland Kitchen MyChart Message (if you have MyChart) OR . A paper copy in the mail If you have any lab test that is abnormal or we need to change your treatment, we will call you to review the results.  Testing/Procedures: NONE ORDERED TODAY  Follow-Up: At [facility], you and your health needs are our priority.  As part of our continuing mission to provide you with exceptional  heart care, we have created designated Provider Care Teams.  These Care Teams include your primary Cardiologist (physician) and Advanced Practice Providers (APPs -  Physician Assistants and Nurse Practitioners) who all work together to provide you with the care you need, when you need it. You will need a follow up appointment in:  6 months.  Please call our office 2 months in advance to schedule this appointment.  You may see [name], MD or one of the following Advanced Practice Providers on your designated Care Team: [name], PA-C [place], [place] . Daune Perch, NP  Any Other Special Instructions Will Be Listed Below (If Applicable).       Jarrett Soho, [place]  04/12/2018 10:24 AM    [address], [place], [place]  [address] Phone: [phone]; Fax: [phone]

## 2018-04-12 NOTE — Patient Instructions (Signed)
Medication Instructions:  1. STOP ASPIRIN  2. STOP BRILINTA   3. START PLAVIX 75 MG DAILY BEGINNING TOMORROW Friday 04/13/18 If you need a refill on your cardiac medications before your next appointment, please call your pharmacy.   Lab work: TODAY BMET, MAGNESIUM LEVEL, LIPID, LFT  If you have labs (blood work) drawn today and your tests are completely normal, you will receive your results only by: Marland Kitchen MyChart Message (if you have MyChart) OR . A paper copy in the mail If you have any lab test that is abnormal or we need to change your treatment, we will call you to review the results.  Testing/Procedures: NONE ORDERED TODAY  Follow-Up: At [facility], you and your health needs are our priority.  As part of our continuing mission to provide you with exceptional heart care, we have created designated Provider Care Teams.  These Care Teams include your primary Cardiologist (physician) and Advanced Practice Providers (APPs -  Physician Assistants and Nurse Practitioners) who all work together to provide you with the care you need, when you need it. You will need a follow up appointment in:  6 months.  Please call our office 2 months in advance to schedule this appointment.  You may see [name], MD or one of the following Advanced Practice Providers on your designated Care Team: [name], PA-C [place], [place] . Daune Perch, NP  Any Other Special Instructions Will Be Listed Below (If Applicable).

## 2018-04-13 ENCOUNTER — Telehealth: Payer: Self-pay | Admitting: *Deleted

## 2018-04-13 DIAGNOSIS — E785 Hyperlipidemia, unspecified: Secondary | ICD-10-CM

## 2018-04-13 DIAGNOSIS — E782 Mixed hyperlipidemia: Secondary | ICD-10-CM

## 2018-04-13 NOTE — Telephone Encounter (Signed)
Called pt re: lab results, left a message for him to call back  

## 2018-04-13 NOTE — Telephone Encounter (Signed)
-----   Message from [place], [place] sent at 04/13/2018  8:17 AM EST ----- Liver functions and electrolytes are normal.  LDL improved but still not at goal.Refere to lipid clinic for PCSK9 inhibitor. However, cost can be issue long term.  Renal function minimally elevated. ? Due to taking MOBIC daily. Advised to cut back. He may discussed alternating option with PCP. Only takes lasix PRN.

## 2018-04-16 NOTE — Telephone Encounter (Signed)
2nd attempt to reach pt re: lab results.  Left another message for pt to call back.

## 2018-04-20 ENCOUNTER — Other Ambulatory Visit: Payer: Self-pay

## 2018-04-20 ENCOUNTER — Encounter: Payer: Self-pay | Admitting: Physical Therapy

## 2018-04-20 ENCOUNTER — Ambulatory Visit: Payer: Medicare Other | Attending: Internal Medicine | Admitting: Physical Therapy

## 2018-04-20 DIAGNOSIS — R2689 Other abnormalities of gait and mobility: Secondary | ICD-10-CM | POA: Diagnosis not present

## 2018-04-20 DIAGNOSIS — R2681 Unsteadiness on feet: Secondary | ICD-10-CM | POA: Insufficient documentation

## 2018-04-20 NOTE — Therapy (Signed)
[facility] [address] [place], [place], [address] Phone: [phone]   Fax:  [phone]  Physical Therapy Evaluation  Patient Details  Name: [name] MRN: [id number] Date of Birth: [birth year] Referring Provider (PT): Dr. [name], MD   Encounter Date: 04/20/2018  PT End of Session - 04/20/18 1556    Visit Number  1    Number of Visits  17    Date for PT Re-Evaluation  06/22/18    Authorization Type  Medicare    PT Start Time  6962    PT Stop Time  1229    PT Time Calculation (min)  44 min    Activity Tolerance  Patient tolerated treatment well    Behavior During Therapy  [facility] for tasks assessed/performed       Past Medical History:  Diagnosis Date  . Adenomatous colon polyp   . Arthritis   . CAD (coronary artery disease)    a. MI in 1996 with stent placement b. cath 01/18/17 - S/p PCI of in-stent restenosis of pLAD with cutting ballon & DES; 20% ostial LAD; 40% pro Cx; 60% focal pRCA  . Clostridium difficile infection   . Depression   . Diverticulosis   . H. pylori infection   . HTN (hypertension)   . Hyperlipidemia   . MI (myocardial infarction) [facility])     Past Surgical History:  Procedure Laterality Date  . BRAIN TUMOR EXCISION  1954   benign tumor in back of head, done at  [facility]  . CARDIAC CATHETERIZATION    . COLONOSCOPY     hx of polyps  . [place]; 1997; 01/18/2017  . CORONARY STENT INTERVENTION N/A 01/18/2017   Procedure: CORONARY STENT INTERVENTION;  Surgeon: Troy Sine, MD;  Location: [facility];  Service: Cardiovascular;  Laterality: N/A;  . JOINT REPLACEMENT    . LEFT HEART CATH AND CORONARY ANGIOGRAPHY N/A 01/18/2017   Procedure: LEFT HEART CATH AND CORONARY ANGIOGRAPHY;  Surgeon: Troy Sine, MD;  Location: [facility];  Service: Cardiovascular;  Laterality: N/A;  . stent x2 in the LAD  with intra-aortic balloon  pump support  1996  . TONSILLECTOMY    . TOTAL KNEE ARTHROPLASTY Bilateral     There were no vitals filed for this visit.   Subjective Assessment - 04/20/18 1147    Subjective  Patient reports he can not walk a straight line - feels like he veers off a little bit. Wants to improve balance; reports he stumbles but does not fall. Feels like he looks at the gorund to see where hes going. Has been feeling unsteady for ~6 montths. Denies pain. Reports some dizzines "every once in a while".     Pertinent History  MI (~96), CAD with stents, HTN, depression    Patient Stated Goals  "I want to get my balance under control"    Currently in Pain?  No/denies    Multiple Pain Sites  No         [facility] PT Assessment - 04/20/18 1152      Assessment   Medical Diagnosis  unsteady gait, gait disorder    Referring Provider (PT)  Dr. [name], MD    Next MD Visit  ~4 months    Prior Therapy  no      Precautions   Precautions  Fall      Restrictions   Weight Bearing Restrictions  No  Balance Screen   Has the patient fallen in the past 6 months  No    Has the patient had a decrease in activity level because of a fear of falling?   No    Is the patient reluctant to leave their home because of a fear of falling?   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, drilling   Overall Cognitive Status  Within Functional Limits for tasks assessed      Sensation   Light Touch  Appears Intact      Coordination   Gross Motor Movements are Fluid and Coordinated  Yes      Posture/Postural Control   Posture/Postural Control  Postural limitations    Postural Limitations   Rounded Shoulders;Forward head      ROM / Strength   AROM / PROM / Strength  Strength      Strength   Strength Assessment Site  Hip;Knee;Ankle    Right/Left Hip  Right;Left    Right Hip Flexion  4/5    Left Hip Flexion  4-/5    Right/Left Knee  Right;Left    Right Knee Flexion  5/5    Right Knee Extension  5/5    Left Knee Flexion  5/5    Left Knee Extension  5/5    Right/Left Ankle  Right;Left    Right Ankle Dorsiflexion  4+/5    Left Ankle Dorsiflexion  4+/5      Ambulation/Gait   Ambulation/Gait  Yes    Ambulation/Gait Assistance  5: Supervision    Ambulation Distance (Feet)  50 Feet    Assistive device  None    Gait Pattern  Step-through pattern;Decreased stride length;Trunk flexed;Narrow base of support    Ambulation Surface  Level;Indoor    Gait velocity  2.1 ft/sec      Balance   Balance Assessed  Yes      Standardized Balance Assessment   Standardized Balance Assessment  Berg Balance Test;Timed Up and Go Test;Five Times Sit to Stand;Dynamic Gait Index    Five times sit to stand comments   18.19      Berg Balance Test   Sit to Stand  Able to stand  independently using hands    Standing Unsupported  Able to stand safely 2 minutes    Sitting with Back Unsupported but Feet Supported on Floor or Stool  Able to sit safely and securely 2 minutes    Stand to Sit  Controls descent by using hands    Transfers  Able to transfer safely, minor use of hands    Standing Unsupported with Eyes Closed  Able to stand 10 seconds with supervision    Standing Ubsupported with Feet Together  Able to place feet together independently and stand for 1 minute with supervision    From Standing, Reach Forward with Outstretched Arm  Can reach forward >5 cm safely (2")    From Standing Position, Pick up Object from [place] to pick up shoe safely  and easily    From Standing Position, Turn to Look Behind Over each Shoulder  Needs assist to keep from losing balance and falling    Turn 360  Degrees  Needs assistance while turning    Standing Unsupported, Alternately Place Feet on Step/Stool  Able to complete >2 steps/needs minimal assist    Standing Unsupported, One Foot in [employer] balance while stepping or standing    Standing on One Leg  Tries to lift leg/unable to hold 3 seconds but remains standing independently    Total Score  32    Berg comment:  high fall risk      Dynamic Gait Index   Level Surface  Mild Impairment    Change in Gait Speed  Mild Impairment    Gait with Horizontal Head Turns  Mild Impairment    Gait with Vertical Head Turns  Mild Impairment    Gait and Pivot Turn  Mild Impairment    Step Over Obstacle  Moderate Impairment    Step Around Obstacles  Mild Impairment    Steps  Mild Impairment    Total Score  15    DGI comment:  high fall risk      Timed Up and Go Test   TUG  Normal TUG    Normal TUG (seconds)  12.75                Objective measurements completed on examination: See above findings.              PT Education - 04/20/18 1555    Education Details  exam findings, POC, goal establishment    Person(s) Educated  Patient    Methods  Explanation    Comprehension  Verbalized understanding       PT Short Term Goals - 04/20/18 1610      PT SHORT TERM GOAL #1   Title  Patient ot be independent with initial HEP    Time  4    Period  Weeks    Status  New    Target Date  05/18/18      PT SHORT TERM GOAL #2   Title  patient to improve gait speed by >/= 0.82 ft/sec to 2.93 ft/sec    Time  4    Period  Weeks    Status  New    Target Date  05/18/18      PT SHORT TERM GOAL #3   Title  patient to improve Berg by >/= 4 points for improved balance    Time  4    Period  Weeks    Status  New    Target Date  05/18/18      PT SHORT TERM GOAL #4   Title  patient to improve DGI to >/= 3 points    Time  4    Period  Weeks    Status  New    Target Date  05/18/18        PT Long Term Goals - 04/20/18 1612       PT LONG TERM GOAL #1   Title  patient to be independent with advanced HEP    Time  8    Period  Weeks    Status  New    Target Date  06/22/18      PT LONG TERM GOAL #2   Title  patient to improve Berg by >/= 8 points    Time  8  Period  Weeks    Status  New    Target Date  06/22/18      PT LONG TERM GOAL #3   Title  patient to improve DGI >/= 6 points for improved balance and mobility    Time  8    Period  Weeks    Status  New    Target Date  06/22/18      PT LONG TERM GOAL #4   Title  patient to improve gait speed to >/= 3.8 ft/sec    Time  8    Period  Weeks    Status  New    Target Date  06/22/18      PT LONG TERM GOAL #5   Title  patient to vebalize fall prevent strategies and home safety techniques.    Time  8    Period  Weeks    Status  New    Target Date  06/22/18             Plan - 04/20/18 1600    Clinical Impression Statement  Mr. Deer is a very pleasant 77 y/o male presenting to [facility] today regarding priamry complaints of imbalance and difficulty walking. Patient today scoring in high fall risk with Berg, DGI, and with noted reduced gait speed below that of a safe community ambulator (see values above). Patient also with noted vertical nystagmus with all gaze - has been evaluated in the past with no further concerns. Patient to benefit from skilled PT intervention to address balance and gait decifits for reduced fall risk.     History and Personal Factors relevant to plan of care:  MI (~96), CAD with stents, HTN, depression    Clinical Presentation  Evolving    Clinical Presentation due to:  MI (~96), CAD with stents, HTN, depression, history of falls    Clinical Decision Making  Moderate    Rehab Potential  Good    PT Frequency  2x / week    PT Duration  8 weeks    PT Treatment/Interventions  ADLs/Self Care Home Management;Cryotherapy;Moist Heat;Therapeutic exercise;Therapeutic activities;Functional mobility training;Stair training;Gait  training;DME Instruction;Balance training;Neuromuscular re-education;Patient/family education;Manual techniques;Vasopneumatic Device;Taping;Passive range of motion    PT Next Visit Plan  initiate HEP for balance and strengthening    Consulted and Agree with Plan of Care  Patient       Patient will benefit from skilled therapeutic intervention in order to improve the following deficits and impairments:  Abnormal gait, Decreased activity tolerance, Decreased balance, Decreased safety awareness, Decreased mobility, Difficulty walking, Decreased strength  Visit Diagnosis: Unsteadiness on feet  Other abnormalities of gait and mobility     Problem List Patient Active Problem List   Diagnosis Date Noted  . Status post coronary artery stent placement: a. (01/18/17) PCI and DES to LAD, EF 40-45%   . Acute on chronic combined systolic and diastolic CHF (congestive heart failure) ([place])   . Unstable angina ([place])   . Coronary artery disease 01/17/2017  . Dizziness 12/14/2011  . Constipation 08/30/2011  . HEADACHE 12/29/2008  . Hyperlipidemia 12/25/2008  . DEPRESSION 12/25/2008  . Hypertension 12/25/2008  . MYOCARDIAL INFARCTION, HX OF 12/25/2008  . CAD 12/25/2008  . ARTHRITIS 12/25/2008     Lanney Gins, PT, DPT Supplemental Physical Therapist 04/20/18 4:19 PM Pager: [phone] Office: [facility] [address] [place], [place], [address] Phone: [phone]   Fax:  [phone]  Name: [name] MRN:  [id number] Date of Birth: 28-Apr-1941

## 2018-04-25 ENCOUNTER — Ambulatory Visit: Payer: Medicare Other | Admitting: Physical Therapy

## 2018-04-27 ENCOUNTER — Ambulatory Visit: Payer: Medicare Other | Admitting: Physical Therapy

## 2018-05-09 ENCOUNTER — Ambulatory Visit: Payer: Medicare Other | Admitting: Physical Therapy

## 2018-05-11 ENCOUNTER — Ambulatory Visit: Payer: Medicare Other | Admitting: Physical Therapy

## 2018-05-16 ENCOUNTER — Ambulatory Visit: Payer: Medicare Other | Admitting: Physical Therapy

## 2018-05-18 ENCOUNTER — Ambulatory Visit: Payer: Medicare Other | Admitting: Physical Therapy

## 2018-05-23 ENCOUNTER — Ambulatory Visit: Payer: Medicare Other | Admitting: Physical Therapy

## 2018-05-25 ENCOUNTER — Ambulatory Visit: Payer: Medicare Other | Admitting: Physical Therapy

## 2018-05-28 ENCOUNTER — Ambulatory Visit: Payer: Medicare Other | Admitting: Physical Therapy

## 2018-05-31 ENCOUNTER — Ambulatory Visit: Payer: Medicare Other | Admitting: Rehabilitative and Restorative Service Providers"

## 2018-06-05 ENCOUNTER — Ambulatory Visit: Payer: Medicare Other | Admitting: Physical Therapy

## 2018-06-07 ENCOUNTER — Ambulatory Visit: Payer: Medicare Other | Admitting: Physical Therapy

## 2018-06-11 ENCOUNTER — Ambulatory Visit (INDEPENDENT_AMBULATORY_CARE_PROVIDER_SITE_OTHER): Payer: Medicare Other | Admitting: Cardiovascular Disease

## 2018-06-11 ENCOUNTER — Encounter: Payer: Self-pay | Admitting: Cardiovascular Disease

## 2018-06-11 VITALS — BP 120/84 | HR 61 | Ht 70.0 in | Wt 221.0 lb

## 2018-06-11 DIAGNOSIS — I2510 Atherosclerotic heart disease of native coronary artery without angina pectoris: Secondary | ICD-10-CM | POA: Diagnosis not present

## 2018-06-11 DIAGNOSIS — E782 Mixed hyperlipidemia: Secondary | ICD-10-CM | POA: Diagnosis not present

## 2018-06-11 NOTE — Progress Notes (Signed)
Cardiology Office Note:    Date:  06/11/2018   ID:  [name], DOB [birth year], MRN [id number]  PCP:  [name], MD  Cardiologist:  [name], MD    Referring MD: [name], MD   Chief Complaint  Patient presents with  . Coronary Artery Disease  . Hyperlipidemia        [name] is a 78 y.o. male with a hx of Coronary artery disease been years ago. He is a previous patient of Dr. Bing Quarry. He presents now with symptoms of exertional chest pain and shortness breath.  He has shortness of breath at night or during the day  Walking 5-6 minutes causes significant dyspnea.  Also has claudication in his legs with walking   Has occasional chest pain  - usually when he is at rest. Has chest squeezing. - occurs randomly  Has lots of indigestion - occurs randomly   Also has lots of sweating - not necessarliy related to his chest tightness  does not feel similar to his previous MI pain .   No CP since that time   Hx of MI in 1996 -  Had stent by [name] Stopped smoking at that time   Does not exercise regularly    .   Works out in the yard.   Sept. 5, 2018: [name] is seen today for follow up visit.   S/p recent stenting of his prox - mid LAD  Hx of CAD , stenting by [name] in the past .   PTs BP has been low Has been on a diet and has been cutting back on his protein and his food in general .  Has lost 8.5 lbs   Had an episode of CP at night.   Better after NTG . Still fairly active .   Walks a little ,  Just started a walking program .   Walks 1/2 mile a day without CP  His medical doctor had recently started him on a BP med and he has stopped it now (does not remember the name of it)   Echo Aug. 28 shows normal LV function   August 18, 2017: [name] is seen today for follow-up of his coronary artery disease. He has a history of coronary artery disease and is status post stent placement to the left anterior descending artery in 2017/02/18 Overall he is doing very well.  Is not had any episodes of angina since his PCI in 02/18/2017.  He does have some shortness of breath with exertion.  He is exercising fairly regularly.  Wife is had problems and he has had to help her.  He is lost some weight since his angioplasty.    His last lipid level shows a triglyceride level of 229.  His total cholesterol is 158.  HDL is 39.  LDL 73.  Jan. 6, 2019:  [name] is seen today for follow-up of his coronary artery disease.  He status post stent placement to the left anterior descending artery in 2017-02-18.  Wife passed away several months .  Pneumonia and sepsis. Had been married 24 years.    Has not had any angina      Past Medical History:  Diagnosis Date  . Adenomatous colon polyp   . Arthritis   . CAD (coronary artery disease)    a. MI in 1996 with stent placement b. cath 01/18/17 - S/p PCI of in-stent restenosis of pLAD with cutting ballon & DES; 20%  ostial LAD; 40% pro Cx; 60% focal pRCA  . Clostridium difficile infection   . Depression   . Diverticulosis   . H. pylori infection   . HTN (hypertension)   . Hyperlipidemia   . MI (myocardial infarction) [facility])     Past Surgical History:  Procedure Laterality Date  . BRAIN TUMOR EXCISION  1954   benign tumor in back of head, done at  [facility]  . CARDIAC CATHETERIZATION    . COLONOSCOPY     hx of polyps  . [place]; 1997; 01/18/2017  . CORONARY STENT INTERVENTION N/A 01/18/2017   Procedure: CORONARY STENT INTERVENTION;  Surgeon: Troy Sine, MD;  Location: [facility];  Service: Cardiovascular;  Laterality: N/A;  . JOINT REPLACEMENT    . LEFT HEART CATH AND CORONARY ANGIOGRAPHY N/A 01/18/2017   Procedure: LEFT HEART CATH AND CORONARY ANGIOGRAPHY;  Surgeon: Troy Sine, MD;  Location: [facility];  Service: Cardiovascular;  Laterality: N/A;  . stent x2 in the LAD  with intra-aortic balloon pump support  1996  .  TONSILLECTOMY    . TOTAL KNEE ARTHROPLASTY Bilateral     Current Medications: Current Meds  Medication Sig  . atorvastatin (LIPITOR) 80 MG tablet Take 80 mg by mouth daily.    . clopidogrel (PLAVIX) 75 MG tablet Take 1 tablet (75 mg total) by mouth daily.  Marland Kitchen esomeprazole (NEXIUM) 40 MG capsule TAKE 1 CAPSULE BY MOUTH TWICE A DAY BEFORE A MEAL  . ezetimibe (ZETIA) 10 MG tablet Take 1 tablet (10 mg total) by mouth daily.  . fenofibrate (TRICOR) 145 MG tablet TAKE 1 TABLET BY MOUTH EVERY DAY  . furosemide (LASIX) 20 MG tablet Take 20 mg by mouth daily as needed for fluid or edema.  Marland Kitchen HYDROcodone-acetaminophen (NORCO) 7.5-325 MG tablet Take 1 tablet by mouth daily as needed. For pain.  . isosorbide mononitrate (IMDUR) 30 MG 24 hr tablet Take 1 tablet (30 mg total) by mouth daily.  . meloxicam (MOBIC) 7.5 MG tablet Take 7.5 mg by mouth 2 (two) times daily.  . metoprolol tartrate (LOPRESSOR) 25 MG tablet Take 0.5 tablets (12.5 mg total) by mouth 2 (two) times daily.  . nitroGLYCERIN (NITROSTAT) 0.4 MG SL tablet Place 1 tablet (0.4 mg total) under the tongue every 5 (five) minutes as needed for chest pain.  . pantoprazole (PROTONIX) 20 MG tablet Take 1 tablet (20 mg total) by mouth daily.  . promethazine (PHENERGAN) 12.5 MG tablet Take 12.5 mg by mouth every 6 (six) hours as needed for nausea or vomiting.  Marland Kitchen tiZANidine (ZANAFLEX) 4 MG tablet Take 1 tablet by mouth as needed.     Allergies:   Patient has no known allergies.   Social History   Socioeconomic History  . Marital status: Married    Spouse name: Not on file  . Number of children: 1  . Years of education: Not on file  . Highest education level: Not on file  Occupational History  . Occupation: Retired  Scientific laboratory technician  . Financial resource strain: Not on file  . Food insecurity:    Worry: Not on file    Inability: Not on file  . Transportation needs:    Medical: Not on file    Non-medical: Not on file  Tobacco Use  . Smoking  status: Former Smoker    Last attempt to quit: 06/06/1994    Years since quitting: 24.0  . Smokeless tobacco: Never Used  Substance and Sexual Activity  . Alcohol use: No  . Drug use: No  . Sexual activity: Yes  Lifestyle  . Physical activity:    Days per week: Not on file    Minutes per session: Not on file  . Stress: Not on file  Relationships  . Social connections:    Talks on phone: Not on file    Gets together: Not on file    Attends religious service: Not on file    Active member of club or organization: Not on file    Attends meetings of clubs or organizations: Not on file    Relationship status: Not on file  Other Topics Concern  . Not on file  Social History Narrative  . Not on file     Family History: The patient's family history includes Cancer in his sister; Heart attack in his mother; Heart attack (age of onset: 3) in his brother. There is no history of Colon cancer, Stomach cancer, Rectal cancer, or Esophageal cancer. ROS:   Please see the history of present illness.     All other systems reviewed and are negative.  EKGs/Labs/Other Studies Reviewed:     Physical Exam: Blood pressure 120/84, pulse 61, height 5\' 10"  (1.778 m), weight 221 lb (100.2 kg), SpO2 95 %.  GEN:  Well nourished, well developed in no acute distress HEENT: Normal NECK: No JVD; No carotid bruits LYMPHATICS: No lymphadenopathy CARDIAC: RRR , no murmurs, rubs, gallops RESPIRATORY:  Clear to auscultation without rales, wheezing or rhonchi  ABDOMEN: Soft, non-tender, non-distended MUSCULOSKELETAL:  No edema; No deformity  SKIN: Warm and dry NEUROLOGIC:  Alert and oriented x 3   EKG:      Recent Labs: 04/12/2018: ALT 14; BUN 23; Creatinine, Ser 1.30; Magnesium 1.9; Potassium 4.7; Sodium 145  Recent Lipid Panel    Component Value Date/Time   CHOL 148 04/12/2018 1025   TRIG 65 04/12/2018 1025   HDL 47 04/12/2018 1025   CHOLHDL 3.1 04/12/2018 1025   CHOLHDL 4 06/23/2009 1147   VLDL  23.4 06/23/2009 1147   LDLCALC 88 04/12/2018 1025   LDLDIRECT 159.1 12/10/2007 0851      ASSESSMENT: PLAN:    In order of problems listed above:  1. Coronary artery disease:   He seems to be doing well.  He is not had any episodes of chest pain or shortness of breath.  He still grieving the death of his wife from a month or so ago.  He is not been exercising quite as much as he would like.  I have encouraged him to take his time and to get back into his exercise regimen.  2.  Chronic diastolic congestive heart failure.   His breathing seems to be well controlled.   3.  Hyperlipidemia:         Medication Adjustments/Labs and Tests Ordered: Current medicines are reviewed at length with the patient today.  Concerns regarding medicines are outlined above.  No orders of the defined types were placed in this encounter.  No orders of the defined types were placed in this encounter.   Signed, [name], MD  06/11/2018 3:05 PM    [facility]

## 2018-06-11 NOTE — Patient Instructions (Signed)

## 2018-06-13 ENCOUNTER — Inpatient Hospital Stay (HOSPITAL_COMMUNITY)
Admission: EM | Admit: 2018-06-13 | Discharge: 2018-06-15 | DRG: 378 | Disposition: A | Discharge status: Home / Self Care | Payer: Medicare Other | Attending: Internal Medicine | Admitting: Internal Medicine

## 2018-06-13 ENCOUNTER — Emergency Department (HOSPITAL_COMMUNITY): Payer: Medicare Other

## 2018-06-13 ENCOUNTER — Encounter (HOSPITAL_COMMUNITY): Payer: Self-pay | Admitting: Emergency Medicine

## 2018-06-13 ENCOUNTER — Other Ambulatory Visit: Payer: Self-pay

## 2018-06-13 DIAGNOSIS — Z79891 Long term (current) use of opiate analgesic: Secondary | ICD-10-CM

## 2018-06-13 DIAGNOSIS — Z8249 Family history of ischemic heart disease and other diseases of the circulatory system: Secondary | ICD-10-CM | POA: Diagnosis not present

## 2018-06-13 DIAGNOSIS — Z7901 Long term (current) use of anticoagulants: Secondary | ICD-10-CM | POA: Diagnosis not present

## 2018-06-13 DIAGNOSIS — J9811 Atelectasis: Secondary | ICD-10-CM | POA: Diagnosis present

## 2018-06-13 DIAGNOSIS — Z87891 Personal history of nicotine dependence: Secondary | ICD-10-CM | POA: Diagnosis not present

## 2018-06-13 DIAGNOSIS — K3189 Other diseases of stomach and duodenum: Secondary | ICD-10-CM | POA: Diagnosis not present

## 2018-06-13 DIAGNOSIS — R1084 Generalized abdominal pain: Secondary | ICD-10-CM | POA: Diagnosis not present

## 2018-06-13 DIAGNOSIS — Z96653 Presence of artificial knee joint, bilateral: Secondary | ICD-10-CM | POA: Diagnosis not present

## 2018-06-13 DIAGNOSIS — I252 Old myocardial infarction: Secondary | ICD-10-CM

## 2018-06-13 DIAGNOSIS — F329 Major depressive disorder, single episode, unspecified: Secondary | ICD-10-CM | POA: Diagnosis present

## 2018-06-13 DIAGNOSIS — K2950 Unspecified chronic gastritis without bleeding: Secondary | ICD-10-CM | POA: Diagnosis present

## 2018-06-13 DIAGNOSIS — K921 Melena: Secondary | ICD-10-CM

## 2018-06-13 DIAGNOSIS — D62 Acute posthemorrhagic anemia: Secondary | ICD-10-CM | POA: Diagnosis not present

## 2018-06-13 DIAGNOSIS — E785 Hyperlipidemia, unspecified: Secondary | ICD-10-CM | POA: Diagnosis not present

## 2018-06-13 DIAGNOSIS — K922 Gastrointestinal hemorrhage, unspecified: Secondary | ICD-10-CM | POA: Diagnosis not present

## 2018-06-13 DIAGNOSIS — Z955 Presence of coronary angioplasty implant and graft: Secondary | ICD-10-CM | POA: Diagnosis not present

## 2018-06-13 DIAGNOSIS — I10 Essential (primary) hypertension: Secondary | ICD-10-CM | POA: Diagnosis not present

## 2018-06-13 DIAGNOSIS — Z7902 Long term (current) use of antithrombotics/antiplatelets: Secondary | ICD-10-CM | POA: Diagnosis not present

## 2018-06-13 DIAGNOSIS — K920 Hematemesis: Secondary | ICD-10-CM | POA: Diagnosis not present

## 2018-06-13 DIAGNOSIS — R079 Chest pain, unspecified: Secondary | ICD-10-CM

## 2018-06-13 DIAGNOSIS — I2510 Atherosclerotic heart disease of native coronary artery without angina pectoris: Secondary | ICD-10-CM | POA: Diagnosis not present

## 2018-06-13 DIAGNOSIS — I5022 Chronic systolic (congestive) heart failure: Secondary | ICD-10-CM | POA: Diagnosis present

## 2018-06-13 DIAGNOSIS — Z791 Long term (current) use of non-steroidal anti-inflammatories (NSAID): Secondary | ICD-10-CM | POA: Diagnosis not present

## 2018-06-13 DIAGNOSIS — K260 Acute duodenal ulcer with hemorrhage: Principal | ICD-10-CM | POA: Diagnosis present

## 2018-06-13 DIAGNOSIS — K269 Duodenal ulcer, unspecified as acute or chronic, without hemorrhage or perforation: Secondary | ICD-10-CM | POA: Diagnosis not present

## 2018-06-13 DIAGNOSIS — I200 Unstable angina: Secondary | ICD-10-CM | POA: Diagnosis not present

## 2018-06-13 DIAGNOSIS — R42 Dizziness and giddiness: Secondary | ICD-10-CM | POA: Diagnosis not present

## 2018-06-13 DIAGNOSIS — R10817 Generalized abdominal tenderness: Secondary | ICD-10-CM | POA: Diagnosis not present

## 2018-06-13 DIAGNOSIS — I110 Hypertensive heart disease with heart failure: Secondary | ICD-10-CM | POA: Diagnosis present

## 2018-06-13 DIAGNOSIS — R0902 Hypoxemia: Secondary | ICD-10-CM | POA: Diagnosis not present

## 2018-06-13 DIAGNOSIS — Z86010 Personal history of colonic polyps: Secondary | ICD-10-CM | POA: Diagnosis not present

## 2018-06-13 DIAGNOSIS — R1030 Lower abdominal pain, unspecified: Secondary | ICD-10-CM | POA: Diagnosis not present

## 2018-06-13 LAB — CBC
HCT: 28.8 % — ABNORMAL LOW (ref 39.0–52.0)
Hemoglobin: 9 g/dL — ABNORMAL LOW (ref 13.0–17.0)
MCH: 30.6 pg (ref 26.0–34.0)
MCHC: 31.3 g/dL (ref 30.0–36.0)
MCV: 98 fL (ref 80.0–100.0)
Platelets: 273 10*3/uL (ref 150–400)
RBC: 2.94 MIL/uL — ABNORMAL LOW (ref 4.22–5.81)
RDW: 15 % (ref 11.5–15.5)
WBC: 9 10*3/uL (ref 4.0–10.5)
nRBC: 0 % (ref 0.0–0.2)

## 2018-06-13 LAB — URINALYSIS, ROUTINE W REFLEX MICROSCOPIC
Bilirubin Urine: NEGATIVE
GLUCOSE, UA: 50 mg/dL — AB
HGB URINE DIPSTICK: NEGATIVE
Ketones, ur: 5 mg/dL — AB
LEUKOCYTES UA: NEGATIVE
Nitrite: NEGATIVE
Protein, ur: NEGATIVE mg/dL
Specific Gravity, Urine: 1.02 (ref 1.005–1.030)
pH: 5 (ref 5.0–8.0)

## 2018-06-13 LAB — COMPREHENSIVE METABOLIC PANEL
ALBUMIN: 3 g/dL — AB (ref 3.5–5.0)
ALT: 11 U/L (ref 0–44)
AST: 13 U/L — AB (ref 15–41)
Alkaline Phosphatase: 25 U/L — ABNORMAL LOW (ref 38–126)
Anion gap: 7 (ref 5–15)
BUN: 64 mg/dL — ABNORMAL HIGH (ref 8–23)
CO2: 24 mmol/L (ref 22–32)
Calcium: 8.1 mg/dL — ABNORMAL LOW (ref 8.9–10.3)
Chloride: 109 mmol/L (ref 98–111)
Creatinine, Ser: 1.35 mg/dL — ABNORMAL HIGH (ref 0.61–1.24)
GFR calc Af Amer: 59 mL/min — ABNORMAL LOW (ref >60)
GFR calc non Af Amer: 51 mL/min — ABNORMAL LOW (ref >60)
GLUCOSE: 211 mg/dL — AB (ref 70–99)
Potassium: 4.7 mmol/L (ref 3.5–5.1)
Sodium: 140 mmol/L (ref 135–145)
Total Bilirubin: 0.8 mg/dL (ref 0.3–1.2)
Total Protein: 5.1 g/dL — ABNORMAL LOW (ref 6.5–8.1)

## 2018-06-13 LAB — HEMOGLOBIN AND HEMATOCRIT, BLOOD
HCT: 27.3 % — ABNORMAL LOW (ref 39.0–52.0)
Hemoglobin: 8.5 g/dL — ABNORMAL LOW (ref 13.0–17.0)

## 2018-06-13 LAB — POC OCCULT BLOOD, ED: Fecal Occult Bld: POSITIVE — AB

## 2018-06-13 LAB — PROTIME-INR
INR: 1.21
Prothrombin Time: 15.2 seconds (ref 11.4–15.2)

## 2018-06-13 LAB — LIPASE, BLOOD: Lipase: 30 U/L (ref 11–51)

## 2018-06-13 MED ORDER — TRAZODONE HCL 50 MG PO TABS
25.0000 mg | ORAL_TABLET | Freq: Every evening | ORAL | Status: DC | PRN
  Administered 2018-06-15: 25 mg via ORAL
  Filled 2018-06-13: qty 1

## 2018-06-13 MED ORDER — TIZANIDINE HCL 4 MG PO TABS
4.0000 mg | ORAL_TABLET | Freq: Three times a day (TID) | ORAL | Status: DC | PRN
  Administered 2018-06-15: 4 mg via ORAL
  Filled 2018-06-13: qty 1

## 2018-06-13 MED ORDER — SODIUM CHLORIDE 0.9% FLUSH
3.0000 mL | Freq: Two times a day (BID) | INTRAVENOUS | Status: DC
  Administered 2018-06-13 – 2018-06-15 (×4): 3 mL via INTRAVENOUS

## 2018-06-13 MED ORDER — FENTANYL CITRATE (PF) 100 MCG/2ML IJ SOLN
50.0000 ug | INTRAMUSCULAR | Status: DC | PRN
  Administered 2018-06-13: 50 ug via INTRAVENOUS
  Filled 2018-06-13 (×2): qty 2

## 2018-06-13 MED ORDER — ONDANSETRON HCL 4 MG PO TABS: 4.0000 mg | ORAL_TABLET | Freq: Four times a day (QID) | ORAL | Status: DC | PRN

## 2018-06-13 MED ORDER — ACETAMINOPHEN 650 MG RE SUPP: 650.0000 mg | Freq: Four times a day (QID) | RECTAL | Status: DC | PRN

## 2018-06-13 MED ORDER — ONDANSETRON HCL 4 MG/2ML IJ SOLN
4.0000 mg | Freq: Four times a day (QID) | INTRAMUSCULAR | Status: DC | PRN
  Administered 2018-06-13: 4 mg via INTRAVENOUS
  Filled 2018-06-13: qty 2

## 2018-06-13 MED ORDER — ACETAMINOPHEN 325 MG PO TABS: 650.0000 mg | ORAL_TABLET | Freq: Four times a day (QID) | ORAL | Status: DC | PRN

## 2018-06-13 MED ORDER — PANTOPRAZOLE SODIUM 40 MG IV SOLR
40.0000 mg | Freq: Once | INTRAVENOUS | Status: AC
  Administered 2018-06-13: 40 mg via INTRAVENOUS
  Filled 2018-06-13: qty 40

## 2018-06-13 MED ORDER — PROMETHAZINE HCL 25 MG/ML IJ SOLN
12.5000 mg | Freq: Once | INTRAMUSCULAR | Status: AC
  Administered 2018-06-13: 12.5 mg via INTRAVENOUS
  Filled 2018-06-13: qty 1

## 2018-06-13 MED ORDER — SODIUM CHLORIDE 0.9 % IV BOLUS
1000.0000 mL | Freq: Once | INTRAVENOUS | Status: AC
  Administered 2018-06-13: 1000 mL via INTRAVENOUS

## 2018-06-13 MED ORDER — PANTOPRAZOLE SODIUM 40 MG IV SOLR
40.0000 mg | Freq: Two times a day (BID) | INTRAVENOUS | Status: DC
  Administered 2018-06-13 – 2018-06-14 (×3): 40 mg via INTRAVENOUS
  Filled 2018-06-13 (×3): qty 40

## 2018-06-13 MED ORDER — SODIUM CHLORIDE 0.9 % IV SOLN
INTRAVENOUS | Status: AC
  Administered 2018-06-13 – 2018-06-14 (×3): via INTRAVENOUS

## 2018-06-13 MED ORDER — ONDANSETRON HCL 4 MG/2ML IJ SOLN
4.0000 mg | Freq: Once | INTRAMUSCULAR | Status: AC
  Administered 2018-06-13: 4 mg via INTRAVENOUS
  Filled 2018-06-13: qty 2

## 2018-06-13 MED ORDER — HYDROCODONE-ACETAMINOPHEN 7.5-325 MG PO TABS: 1.0000 | ORAL_TABLET | Freq: Four times a day (QID) | ORAL | Status: DC | PRN

## 2018-06-13 MED ORDER — TRAMADOL HCL 50 MG PO TABS: 50.0000 mg | ORAL_TABLET | Freq: Four times a day (QID) | ORAL | Status: DC | PRN

## 2018-06-13 NOTE — H&P (View-Only) (Signed)
Agree with Ms. Alphia Kava management.   He has melena, coffee-ground emesis in setting of clopidogrel and meloxicam w/o PPI.  EGD to evaluate - NSAID ulcer or gastritis likely  Gatha Mayer, MD, [facility]    Referring Provider:  Dr. Ronnald Nian, [place] Primary Care Physician:  [name], MD Primary Gastroenterologist:  Dr. Ardis Hughs  Reason for Consultation:  Anemia, heme positive stool  HPI: [name] is a 78 y.o. male with past medical history of coronary artery disease with stents last placed in 2018 on chronic Plavix, hypertension, hyperlipidemia, and arthritis.  He presented to [facility] long ED early in the morning on January 8 with complaints of abdominal pain, vomiting dark material, and black stools that began Monday night into Tuesday.  He says that he ate a salad from [employer] on Monday and symptoms began sometime after that.  He describes generalized abdominal pain that he said initially seemed down his lower abdomen/groin but then radiated into his upper/entire abdomen.  He vomited a couple times it did contain some dark material.  He says his stools have been black liquid since Monday night.  Had one occurrence of black stools in the emergency department that were heme positive.  Hgb 9.0 grams this AM and last for comparison was over a year ago with Hgb 11.7 grams.  MCV normal.  BUN 64.    He denies any abdominal pain currently.  Last took his Plavix yesterday morning, 1/7.  Has been on Mobic twice daily for quite some time.  Is not on any type of PPI medication for several months, although reports that he was previously on Nexium.   EGD in 07/2014:  ENDOSCOPIC IMPRESSION: There was mild to moderate non-specific pan gastritis. This was biopsied distally and sent to pathology. There was a small amount of retained solid and liquid food in stomach. The z-line was slightly irregular, but there were no nodules and it looked unchanged from 2013 at which time biopsies showed no  Barrett's, biopsies were not repeated today. The examination was otherwise normal.  Path showed chronic gastritis, negative for Hpylori.  Colonoscopy 09/2011 showed moderate diverticulosis.  5 year recall recommended due to previous history of polyps.   Past Medical History:  Diagnosis Date  . Adenomatous colon polyp   . Arthritis   . CAD (coronary artery disease)    a. MI in 1996 with stent placement b. cath 01/18/17 - S/p PCI of in-stent restenosis of pLAD with cutting ballon & DES; 20% ostial LAD; 40% pro Cx; 60% focal pRCA  . Clostridium difficile infection   . Depression   . Diverticulosis   . H. pylori infection   . HTN (hypertension)   . Hyperlipidemia   . MI (myocardial infarction) [facility])     Past Surgical History:  Procedure Laterality Date  . BRAIN TUMOR EXCISION  1954   benign tumor in back of head, done at  [facility]  . CARDIAC CATHETERIZATION    . COLONOSCOPY     hx of polyps  . [place]; 1997; 01/18/2017  . CORONARY STENT INTERVENTION N/A 01/18/2017   Procedure: CORONARY STENT INTERVENTION;  Surgeon: Troy Sine, MD;  Location: [place] [facility];  Service: Cardiovascular;  Laterality: N/A;  . JOINT REPLACEMENT    . LEFT HEART CATH AND CORONARY ANGIOGRAPHY N/A 01/18/2017   Procedure: LEFT HEART CATH AND CORONARY ANGIOGRAPHY;  Surgeon: Troy Sine, MD;  Location: [place] [facility];  Service: Cardiovascular;  Laterality: N/A;  . stent x2 in the LAD  with intra-aortic balloon pump support  1996  . TONSILLECTOMY    . TOTAL KNEE ARTHROPLASTY Bilateral     Prior to Admission medications   Medication Sig Start Date End Date Taking? Authorizing Provider  atorvastatin (LIPITOR) 80 MG tablet Take 80 mg by mouth daily.      [provider]  clopidogrel (PLAVIX) 75 MG tablet Take 1 tablet (75 mg total) by mouth daily. 04/12/18 04/12/19  Leanor Kail, PA  ezetimibe (ZETIA) 10 MG tablet Take 1 tablet (10 mg total) by mouth  daily. 08/21/17   Nahser, Wonda Cheng, MD  fenofibrate (TRICOR) 145 MG tablet TAKE 1 TABLET BY MOUTH EVERY DAY Patient taking differently: Take 145 mg by mouth daily.  04/04/18   Nahser, Wonda Cheng, MD  furosemide (LASIX) 20 MG tablet Take 20 mg by mouth daily as needed for fluid or edema.    [provider]  HYDROcodone-acetaminophen (NORCO) 7.5-325 MG tablet Take 1 tablet by mouth daily as needed. For pain. 01/17/17   [provider]  isosorbide mononitrate (IMDUR) 30 MG 24 hr tablet Take 1 tablet (30 mg total) by mouth daily. 01/20/17   Delos Haring, PA-C  meloxicam (MOBIC) 7.5 MG tablet Take 7.5 mg by mouth 2 (two) times daily. 03/16/18   [provider]  metoprolol tartrate (LOPRESSOR) 25 MG tablet Take 0.5 tablets (12.5 mg total) by mouth 2 (two) times daily. 01/19/17   Delos Haring, PA-C  nitroGLYCERIN (NITROSTAT) 0.4 MG SL tablet Place 1 tablet (0.4 mg total) under the tongue every 5 (five) minutes as needed for chest pain. 01/19/17   Delos Haring, PA-C  promethazine (PHENERGAN) 12.5 MG tablet Take 12.5 mg by mouth every 6 (six) hours as needed for nausea or vomiting.    [provider]  tiZANidine (ZANAFLEX) 4 MG tablet Take 1 tablet by mouth as needed. 02/13/18   [provider]    Current Facility-Administered Medications  Medication Dose Route Frequency Provider Last Rate Last Dose  . 0.9 %  sodium chloride infusion   Intravenous Continuous Marcell Anger, MD 100 mL/hr at 06/13/18 1147    . acetaminophen (TYLENOL) tablet 650 mg  650 mg Oral Q6H PRN Spongberg, Audie Pinto, MD       Or  . acetaminophen (TYLENOL) suppository 650 mg  650 mg Rectal Q6H PRN Spongberg, Audie Pinto, MD      . ondansetron (ZOFRAN) tablet 4 mg  4 mg Oral Q6H PRN Spongberg, Audie Pinto, MD       Or  . ondansetron (ZOFRAN) injection 4 mg  4 mg Intravenous Q6H PRN Spongberg, Audie Pinto, MD      . pantoprazole (PROTONIX) injection 40 mg  40 mg  Intravenous Q12H Spongberg, Audie Pinto, MD      . sodium chloride flush (NS) 0.9 % injection 3 mL  3 mL Intravenous Q12H Marcell Anger, MD   3 mL at 06/13/18 1101  . tiZANidine (ZANAFLEX) tablet 4 mg  4 mg Oral Q8H PRN Spongberg, Audie Pinto, MD      . traMADol Veatrice Bourbon) tablet 50 mg  50 mg Oral Q6H PRN Marcell Anger, MD      . traZODone (DESYREL) tablet 25 mg  25 mg Oral QHS PRN Marcell Anger, MD        Allergies as of 06/13/2018  . (No Known Allergies)    Family History  Problem Relation Age of Onset  . Heart  attack Mother        60s  . Heart attack Brother 92  . Cancer Sister        unsure of type  . Colon cancer Neg Hx   . Stomach cancer Neg Hx   . Rectal cancer Neg Hx   . Esophageal cancer Neg Hx     Social History   Socioeconomic History  . Marital status: Married    Spouse name: Not on file  . Number of children: 1  . Years of education: Not on file  . Highest education level: Not on file  Occupational History  . Occupation: Retired  Scientific laboratory technician  . Financial resource strain: Not on file  . Food insecurity:    Worry: Not on file    Inability: Not on file  . Transportation needs:    Medical: Not on file    Non-medical: Not on file  Tobacco Use  . Smoking status: Former Smoker    Last attempt to quit: 06/06/1994    Years since quitting: 24.0  . Smokeless tobacco: Never Used  Substance and Sexual Activity  . Alcohol use: No  . Drug use: No  . Sexual activity: Yes  Lifestyle  . Physical activity:    Days per week: Not on file    Minutes per session: Not on file  . Stress: Not on file  Relationships  . Social connections:    Talks on phone: Not on file    Gets together: Not on file    Attends religious service: Not on file    Active member of club or organization: Not on file    Attends meetings of clubs or organizations: Not on file    Relationship status: Not on file  . Intimate partner violence:    Fear of  current or ex partner: Not on file    Emotionally abused: Not on file    Physically abused: Not on file    Forced sexual activity: Not on file  Other Topics Concern  . Not on file  Social History Narrative  . Not on file    Review of Systems: ROS is O/W negative except as mentioned in HPI.  Physical Exam: Vital signs in last 24 hours: Temp:  [97.9 F (36.6 C)-98.5 F (36.9 C)] 98.5 F (36.9 C) (01/08 1051) Pulse Rate:  [80-113] 80 (01/08 1051) Resp:  [12-18] 18 (01/08 1051) BP: (95-104)/(56-71) 102/67 (01/08 1051) SpO2:  [95 %-99 %] 95 % (01/08 1051) Weight:  [99.3 kg] 99.3 kg (01/08 0958) Last BM Date: 06/13/18 General:  Alert, Well-developed, well-nourished, pleasant and cooperative in NAD Head:  Normocephalic and atraumatic. Eyes:  Sclera clear, no icterus.   Conjunctiva pink. Ears:  Normal auditory acuity. Mouth:  No deformity or lesions.   Lungs:  Clear throughout to auscultation.  No wheezes, crackles, or rhonchi.  Heart:  Regular rate and rhythm; no murmurs, clicks, rubs, or gallops. Abdomen:  Soft, non-distended.  BS present.  Non-tender. Rectal:  Deferred.  Heme positive in the ED.  Msk:  Symmetrical without gross deformities. Pulses:  Normal pulses noted. Extremities:  Without clubbing or edema. Neurologic:  Alert and oriented x 4;  grossly normal neurologically. Skin:  Intact without significant lesions or rashes. Psych:  Alert and cooperative. Normal mood and affect.  Intake/Output this shift: Total I/O In: 1081.4 [I.V.:81.4; IV Piggyback:1000] Out: -   Lab Results: Recent Labs    06/13/18 0559 06/13/18 1139  WBC 9.0  --   HGB  9.0* 8.5*  HCT 28.8* 27.3*  PLT 273  --    BMET Recent Labs    06/13/18 0559  NA 140  K 4.7  CL 109  CO2 24  GLUCOSE 211*  BUN 64*  CREATININE 1.35*  CALCIUM 8.1*   LFT Recent Labs    06/13/18 0559  PROT 5.1*  ALBUMIN 3.0*  AST 13*  ALT 11  ALKPHOS 25*  BILITOT 0.8   PT/INR Recent Labs     06/13/18 0659  LABPROT 15.2  INR 1.21   Studies/Results: Dg Chest Port 1 View  Result Date: 06/13/2018 CLINICAL DATA:  Pain and dizziness EXAM: PORTABLE CHEST 1 VIEW COMPARISON:  June 14, 2010. FINDINGS: There is bibasilar atelectasis. Lungs elsewhere clear. Heart size and pulmonary vascularity are normal. No adenopathy. There is evidence of an old healed fracture of the right clavicle. IMPRESSION: Bibasilar atelectasis. Lungs elsewhere clear. Stable cardiac silhouette. Electronically Signed   By: Lowella Grip III M.D.   On: 06/13/2018 07:29   IMPRESSION:  *79 year old male with reports of abdominal pain, vomiting of dark material, black stools starting Monday evening.  Was heme positive in the ED.  Hgb 9.0 grams (last for comparison was over one year ago at which time it was 11.7 grams). *Chronic antiplatelet use with Plavix due to history of CAD:  Last dose 1/7 AM. *Chronic NSAID use with BID Mobic for several months:  Not on any PPI at home.  PLAN: -Will plan for EGD on 1/9 with Dr. Carlean Purl to rule out ulcer, etc. -Can have clear liquids today then NPO after midnight. -Monitor Hgb and transfuse prn. -Agree with IV BID pantoprazole for now.   Laban Emperor. Zehr  06/13/2018, 12:49 PM

## 2018-06-13 NOTE — ED Notes (Signed)
Bed: OA55 Expected date:  Expected time:  Means of arrival:  Comments: Elderly, abd pain

## 2018-06-13 NOTE — ED Provider Notes (Signed)
[place] DEPT Provider Note   CSN: [id number] Arrival date & time: 06/13/18  0530     History   Chief Complaint Chief Complaint  Patient presents with  . Abdominal Pain  . Nausea  . Emesis    HPI [name] is a 78 y.o. male.  78 y.o male with a PMH HTN, HLD, CAD stent placement 2018 presents to the ED with a chief complaint of abdominal pain, shortness of breath, chest pain x 2 days. Patient reports multiple notes of dark stool since Monday.  Also reports some epigastric pain which radiates to his chest and causes him to have short of breath.  He reports seeing his cardiologist on Monday for a regular checkup as patient had stents placed in 2018, reports his visit showed no abnormalities.  Also reports feeling more clammy, chills along with leg weakness.  Patient also endorses dizziness.  He also reports one episode of hematochezia yesterday.  Denies any fever, headache or urinary complaints.      Past Medical History:  Diagnosis Date  . Adenomatous colon polyp   . Arthritis   . CAD (coronary artery disease)    a. MI in 1996 with stent placement b. cath 01/18/17 - S/p PCI of in-stent restenosis of pLAD with cutting ballon & DES; 20% ostial LAD; 40% pro Cx; 60% focal pRCA  . Clostridium difficile infection   . Depression   . Diverticulosis   . H. pylori infection   . HTN (hypertension)   . Hyperlipidemia   . MI (myocardial infarction) [facility])     Patient Active Problem List   Diagnosis Date Noted  . Status post coronary artery stent placement: a. (01/18/17) PCI and DES to LAD, EF 40-45%   . Acute on chronic combined systolic and diastolic CHF (congestive heart failure) ([place])   . Unstable angina ([place])   . Coronary artery disease 01/17/2017  . Dizziness 12/14/2011  . Constipation 08/30/2011  . HEADACHE 12/29/2008  . Hyperlipidemia 12/25/2008  . DEPRESSION 12/25/2008  . Hypertension 12/25/2008  . MYOCARDIAL INFARCTION, HX OF 12/25/2008    . CAD 12/25/2008  . ARTHRITIS 12/25/2008    Past Surgical History:  Procedure Laterality Date  . BRAIN TUMOR EXCISION  1954   benign tumor in back of head, done at  [facility]  . CARDIAC CATHETERIZATION    . COLONOSCOPY     hx of polyps  . [place]; 1997; 01/18/2017  . CORONARY STENT INTERVENTION N/A 01/18/2017   Procedure: CORONARY STENT INTERVENTION;  Surgeon: Troy Sine, MD;  Location: [place] [facility];  Service: Cardiovascular;  Laterality: N/A;  . JOINT REPLACEMENT    . LEFT HEART CATH AND CORONARY ANGIOGRAPHY N/A 01/18/2017   Procedure: LEFT HEART CATH AND CORONARY ANGIOGRAPHY;  Surgeon: Troy Sine, MD;  Location: [place] [facility];  Service: Cardiovascular;  Laterality: N/A;  . stent x2 in the LAD  with intra-aortic balloon pump support  1996  . TONSILLECTOMY    . TOTAL KNEE ARTHROPLASTY Bilateral         Home Medications    Prior to Admission medications   Medication Sig Start Date End Date Taking? Authorizing Provider  atorvastatin (LIPITOR) 80 MG tablet Take 80 mg by mouth daily.      [provider]  clopidogrel (PLAVIX) 75 MG tablet Take 1 tablet (75 mg total) by mouth daily. 04/12/18 04/12/19  Leanor Kail, PA  esomeprazole (NEXIUM) 40 MG capsule  TAKE 1 CAPSULE BY MOUTH TWICE A DAY BEFORE A MEAL Patient taking differently: Take 40 mg by mouth 2 (two) times daily before a meal.  10/24/17   Milus Banister, MD  ezetimibe (ZETIA) 10 MG tablet Take 1 tablet (10 mg total) by mouth daily. 08/21/17   Nahser, Wonda Cheng, MD  fenofibrate (TRICOR) 145 MG tablet TAKE 1 TABLET BY MOUTH EVERY DAY Patient taking differently: Take 145 mg by mouth daily.  04/04/18   Nahser, Wonda Cheng, MD  furosemide (LASIX) 20 MG tablet Take 20 mg by mouth daily as needed for fluid or edema.    [provider]  HYDROcodone-acetaminophen (NORCO) 7.5-325 MG tablet Take 1 tablet by mouth daily as needed. For pain. 01/17/17   [provider]  isosorbide mononitrate (IMDUR) 30 MG 24 hr tablet Take 1 tablet (30 mg total) by mouth daily. 01/20/17   Delos Haring, PA-C  meloxicam (MOBIC) 7.5 MG tablet Take 7.5 mg by mouth 2 (two) times daily. 03/16/18   [provider]  metoprolol tartrate (LOPRESSOR) 25 MG tablet Take 0.5 tablets (12.5 mg total) by mouth 2 (two) times daily. 01/19/17   Delos Haring, PA-C  nitroGLYCERIN (NITROSTAT) 0.4 MG SL tablet Place 1 tablet (0.4 mg total) under the tongue every 5 (five) minutes as needed for chest pain. 01/19/17   Delos Haring, PA-C  pantoprazole (PROTONIX) 20 MG tablet Take 1 tablet (20 mg total) by mouth daily. 01/19/17   Delos Haring, PA-C  promethazine (PHENERGAN) 12.5 MG tablet Take 12.5 mg by mouth every 6 (six) hours as needed for nausea or vomiting.    [provider]  tiZANidine (ZANAFLEX) 4 MG tablet Take 1 tablet by mouth as needed. 02/13/18   [provider]    Family History Family History  Problem Relation Age of Onset  . Heart attack Mother        49s  . Heart attack Brother 79  . Cancer Sister        unsure of type  . Colon cancer Neg Hx   . Stomach cancer Neg Hx   . Rectal cancer Neg Hx   . Esophageal cancer Neg Hx     Social History Social History   Tobacco Use  . Smoking status: Former Smoker    Last attempt to quit: 06/06/1994    Years since quitting: 24.0  . Smokeless tobacco: Never Used  Substance Use Topics  . Alcohol use: No  . Drug use: No     Allergies   Patient has no known allergies.   Review of Systems Review of Systems  Constitutional: Negative for chills and fever.  HENT: Negative for ear pain and sore throat.   Eyes: Negative for pain and visual disturbance.  Respiratory: Positive for shortness of breath. Negative for cough.   Cardiovascular: Positive for chest pain. Negative for palpitations.  Gastrointestinal: Positive for abdominal pain, diarrhea, nausea and vomiting.  Genitourinary:  Negative for dysuria, flank pain and hematuria.  Musculoskeletal: Negative for arthralgias and back pain.  Skin: Negative for color change and rash.  Neurological: Positive for dizziness and weakness. Negative for seizures and syncope.  All other systems reviewed and are negative.    Physical Exam Updated Vital Signs BP (!) 104/56 (BP Location: Right Arm)   Pulse 83   Temp 97.9 F (36.6 C) (Oral)   Resp 14   SpO2 99%   Physical Exam Vitals signs and nursing note reviewed. Exam conducted with a chaperone present.  Constitutional:      Appearance: He is well-developed. He is ill-appearing and diaphoretic.     Comments: clammy, diaphoretic, ill-appearing.  HENT:     Head: Normocephalic and atraumatic.  Eyes:     General: No scleral icterus.    Pupils: Pupils are equal, round, and reactive to light.  Neck:     Musculoskeletal: Normal range of motion.  Cardiovascular:     Rate and Rhythm: Tachycardia present.     Heart sounds: Normal heart sounds.  Pulmonary:     Effort: Pulmonary effort is normal.     Breath sounds: Normal breath sounds. No wheezing.  Chest:     Chest wall: No tenderness.  Abdominal:     General: Bowel sounds are normal. There is no distension.     Palpations: Abdomen is soft.     Tenderness: There is generalized abdominal tenderness. There is no right CVA tenderness or left CVA tenderness.  Genitourinary:    Comments: Black tarry stool all around anal region and inside rectum vault. Chaperoned by Lurline Hare. Musculoskeletal:        General: No tenderness or deformity.  Skin:    General: Skin is warm.  Neurological:     Mental Status: He is alert and oriented to person, place, and time.      ED Treatments / Results  Labs (all labs ordered are listed, but only abnormal results are displayed) Labs Reviewed  COMPREHENSIVE METABOLIC PANEL - Abnormal; Notable for the following components:      Result Value   Glucose, Bld 211 (*)    BUN 64 (*)     Creatinine, Ser 1.35 (*)    Calcium 8.1 (*)    Total Protein 5.1 (*)    Albumin 3.0 (*)    AST 13 (*)    Alkaline Phosphatase 25 (*)    GFR calc non Af Amer 51 (*)    GFR calc Af Amer 59 (*)    All other components within normal limits  CBC - Abnormal; Notable for the following components:   RBC 2.94 (*)    Hemoglobin 9.0 (*)    HCT 28.8 (*)    All other components within normal limits  URINALYSIS, ROUTINE W REFLEX MICROSCOPIC - Abnormal; Notable for the following components:   Glucose, UA 50 (*)    Ketones, ur 5 (*)    All other components within normal limits  POC OCCULT BLOOD, ED - Abnormal; Notable for the following components:   Fecal Occult Bld POSITIVE (*)    All other components within normal limits  LIPASE, BLOOD  PROTIME-INR  TYPE AND SCREEN    EKG EKG Interpretation  Date/Time:  Wednesday June 13 2018 06:03:08 EST Ventricular Rate:  104 PR Interval:    QRS Duration: 119 QT Interval:  367 QTC Calculation: 483 R Axis:   -46 Text Interpretation:  Sinus tachycardia LAD, consider left anterior fascicular block Anteroseptal infarct, old Abnormal T, consider ischemia, lateral leads Previously Sinus Bradycarida Confirmed by Shanon Rosser [phone]) on 06/13/2018 6:09:02 AM Also confirmed by Shanon Rosser [phone]), editor Philomena Doheny [phone])  on 06/13/2018 6:53:41 AM   Radiology Dg Chest Port 1 View  Result Date: 06/13/2018 CLINICAL DATA:  Pain and dizziness EXAM: PORTABLE CHEST 1 VIEW COMPARISON:  June 14, 2010. FINDINGS: There is bibasilar atelectasis. Lungs elsewhere clear. Heart size and pulmonary vascularity are normal. No adenopathy. There is evidence of an old healed fracture of the right clavicle. IMPRESSION: Bibasilar atelectasis. Lungs elsewhere clear.  Stable cardiac silhouette. Electronically Signed   By: Lowella Grip III M.D.   On: 06/13/2018 07:29    Procedures Procedures (including critical care time)  Medications Ordered in ED Medications  fentaNYL  (SUBLIMAZE) injection 50 mcg (50 mcg Intravenous Given 06/13/18 0617)  promethazine (PHENERGAN) injection 12.5 mg (has no administration in time range)  ondansetron (ZOFRAN) injection 4 mg (4 mg Intravenous Given 06/13/18 0616)  sodium chloride 0.9 % bolus 1,000 mL (0 mLs Intravenous Stopped 06/13/18 0803)  pantoprazole (PROTONIX) injection 40 mg (40 mg Intravenous Given 06/13/18 0735)     Initial Impression / Assessment and Plan / ED Course  I have reviewed the triage vital signs and the nursing notes.  Pertinent labs & imaging results that were available during my care of the patient were reviewed by me and considered in my medical decision making (see chart for details).  Clinical Course as of Jun 13 826  Wed Jun 13, 2018  0734 Hemoglobin(!): 9.0 [JS]  0734 Decreased from 11 from previous visit 1 year ago.   [JS]    Clinical Course User Index [JS] Janeece Fitting, PA-C    Presents with black tarry stools x2 days.  Reports an episode of hematochezia, he states not tried any therapy at home for relieving symptoms.  Today he reports symptoms are worsening and he is beginning to feel dizzy, weak.  He does report some epigastric pain which radiates to his chest. Rectal exam performed by myself with RN present, black tarry stools all along anal region.  BMP showed a creatinine of 1.35 which is consistent with patient's previous visit but worsening.  [facility] showed a hemoglobin of 9 decreased from previous 11.  EKG showed no changes consistent with infarct.  Hemoccult perform positive.  At this time GI order set has been used to treat patient, tachycardia and hypotension.  Chest X-ray revealed no abnormality, he denies any cough symptoms.  7:49 AM Spoke to [employer] provider on call who will evaluate patient in the ED.  8:24 AM  Spoke to Dr. Wyonia Hough hospitalist who will evaluate patient and ED for admission.   Final Clinical Impressions(s) / ED Diagnoses   Final diagnoses:  Chest pain    Gastrointestinal hemorrhage with melena    ED Discharge Orders    None       Janeece Fitting, PA-C 06/13/18 0828    Lennice Sites, DO 06/13/18 1638

## 2018-06-13 NOTE — ED Triage Notes (Signed)
Patient here from home with complaints of bilateral lower abd pain, n/v, diarrhea that started 3 days ago.

## 2018-06-13 NOTE — Consult Note (Addendum)
Agree with Ms. Alphia Kava management.   He has melena, coffee-ground emesis in setting of clopidogrel and meloxicam w/o PPI.  EGD to evaluate - NSAID ulcer or gastritis likely  Gatha Mayer, MD, [facility]    Referring Provider:  Dr. Ronnald Nian, [place] Primary Care Physician:  [name], MD Primary Gastroenterologist:  Dr. Ardis Hughs  Reason for Consultation:  Anemia, heme positive stool  HPI: [name] is a 78 y.o. male with past medical history of coronary artery disease with stents last placed in 2018 on chronic Plavix, hypertension, hyperlipidemia, and arthritis.  He presented to [facility] long ED early in the morning on January 8 with complaints of abdominal pain, vomiting dark material, and black stools that began Monday night into Tuesday.  He says that he ate a salad from [employer] on Monday and symptoms began sometime after that.  He describes generalized abdominal pain that he said initially seemed down his lower abdomen/groin but then radiated into his upper/entire abdomen.  He vomited a couple times it did contain some dark material.  He says his stools have been black liquid since Monday night.  Had one occurrence of black stools in the emergency department that were heme positive.  Hgb 9.0 grams this AM and last for comparison was over a year ago with Hgb 11.7 grams.  MCV normal.  BUN 64.    He denies any abdominal pain currently.  Last took his Plavix yesterday morning, 1/7.  Has been on Mobic twice daily for quite some time.  Is not on any type of PPI medication for several months, although reports that he was previously on Nexium.   EGD in 07/2014:  ENDOSCOPIC IMPRESSION: There was mild to moderate non-specific pan gastritis. This was biopsied distally and sent to pathology. There was a small amount of retained solid and liquid food in stomach. The z-line was slightly irregular, but there were no nodules and it looked unchanged from 2013 at which time biopsies showed no  Barrett's, biopsies were not repeated today. The examination was otherwise normal.  Path showed chronic gastritis, negative for Hpylori.  Colonoscopy 09/2011 showed moderate diverticulosis.  5 year recall recommended due to previous history of polyps.   Past Medical History:  Diagnosis Date  . Adenomatous colon polyp   . Arthritis   . CAD (coronary artery disease)    a. MI in 1996 with stent placement b. cath 01/18/17 - S/p PCI of in-stent restenosis of pLAD with cutting ballon & DES; 20% ostial LAD; 40% pro Cx; 60% focal pRCA  . Clostridium difficile infection   . Depression   . Diverticulosis   . H. pylori infection   . HTN (hypertension)   . Hyperlipidemia   . MI (myocardial infarction) [facility])     Past Surgical History:  Procedure Laterality Date  . BRAIN TUMOR EXCISION  1954   benign tumor in back of head, done at  [facility]  . CARDIAC CATHETERIZATION    . COLONOSCOPY     hx of polyps  . [place]; 1997; 01/18/2017  . CORONARY STENT INTERVENTION N/A 01/18/2017   Procedure: CORONARY STENT INTERVENTION;  Surgeon: Troy Sine, MD;  Location: [place] [facility];  Service: Cardiovascular;  Laterality: N/A;  . JOINT REPLACEMENT    . LEFT HEART CATH AND CORONARY ANGIOGRAPHY N/A 01/18/2017   Procedure: LEFT HEART CATH AND CORONARY ANGIOGRAPHY;  Surgeon: Troy Sine, MD;  Location: [place] [facility];  Service: Cardiovascular;  Laterality: N/A;  . stent x2 in the LAD  with intra-aortic balloon pump support  1996  . TONSILLECTOMY    . TOTAL KNEE ARTHROPLASTY Bilateral     Prior to Admission medications   Medication Sig Start Date End Date Taking? Authorizing Provider  atorvastatin (LIPITOR) 80 MG tablet Take 80 mg by mouth daily.      [provider]  clopidogrel (PLAVIX) 75 MG tablet Take 1 tablet (75 mg total) by mouth daily. 04/12/18 04/12/19  Leanor Kail, PA  ezetimibe (ZETIA) 10 MG tablet Take 1 tablet (10 mg total) by mouth  daily. 08/21/17   Nahser, Wonda Cheng, MD  fenofibrate (TRICOR) 145 MG tablet TAKE 1 TABLET BY MOUTH EVERY DAY Patient taking differently: Take 145 mg by mouth daily.  04/04/18   Nahser, Wonda Cheng, MD  furosemide (LASIX) 20 MG tablet Take 20 mg by mouth daily as needed for fluid or edema.    [provider]  HYDROcodone-acetaminophen (NORCO) 7.5-325 MG tablet Take 1 tablet by mouth daily as needed. For pain. 01/17/17   [provider]  isosorbide mononitrate (IMDUR) 30 MG 24 hr tablet Take 1 tablet (30 mg total) by mouth daily. 01/20/17   Delos Haring, PA-C  meloxicam (MOBIC) 7.5 MG tablet Take 7.5 mg by mouth 2 (two) times daily. 03/16/18   [provider]  metoprolol tartrate (LOPRESSOR) 25 MG tablet Take 0.5 tablets (12.5 mg total) by mouth 2 (two) times daily. 01/19/17   Delos Haring, PA-C  nitroGLYCERIN (NITROSTAT) 0.4 MG SL tablet Place 1 tablet (0.4 mg total) under the tongue every 5 (five) minutes as needed for chest pain. 01/19/17   Delos Haring, PA-C  promethazine (PHENERGAN) 12.5 MG tablet Take 12.5 mg by mouth every 6 (six) hours as needed for nausea or vomiting.    [provider]  tiZANidine (ZANAFLEX) 4 MG tablet Take 1 tablet by mouth as needed. 02/13/18   [provider]    Current Facility-Administered Medications  Medication Dose Route Frequency Provider Last Rate Last Dose  . 0.9 %  sodium chloride infusion   Intravenous Continuous Marcell Anger, MD 100 mL/hr at 06/13/18 1147    . acetaminophen (TYLENOL) tablet 650 mg  650 mg Oral Q6H PRN Spongberg, Audie Pinto, MD       Or  . acetaminophen (TYLENOL) suppository 650 mg  650 mg Rectal Q6H PRN Spongberg, Audie Pinto, MD      . ondansetron (ZOFRAN) tablet 4 mg  4 mg Oral Q6H PRN Spongberg, Audie Pinto, MD       Or  . ondansetron (ZOFRAN) injection 4 mg  4 mg Intravenous Q6H PRN Spongberg, Audie Pinto, MD      . pantoprazole (PROTONIX) injection 40 mg  40 mg  Intravenous Q12H Spongberg, Audie Pinto, MD      . sodium chloride flush (NS) 0.9 % injection 3 mL  3 mL Intravenous Q12H Marcell Anger, MD   3 mL at 06/13/18 1101  . tiZANidine (ZANAFLEX) tablet 4 mg  4 mg Oral Q8H PRN Spongberg, Audie Pinto, MD      . traMADol Veatrice Bourbon) tablet 50 mg  50 mg Oral Q6H PRN Marcell Anger, MD      . traZODone (DESYREL) tablet 25 mg  25 mg Oral QHS PRN Marcell Anger, MD        Allergies as of 06/13/2018  . (No Known Allergies)    Family History  Problem Relation Age of Onset  . Heart  attack Mother        61s  . Heart attack Brother 30  . Cancer Sister        unsure of type  . Colon cancer Neg Hx   . Stomach cancer Neg Hx   . Rectal cancer Neg Hx   . Esophageal cancer Neg Hx     Social History   Socioeconomic History  . Marital status: Married    Spouse name: Not on file  . Number of children: 1  . Years of education: Not on file  . Highest education level: Not on file  Occupational History  . Occupation: Retired  Scientific laboratory technician  . Financial resource strain: Not on file  . Food insecurity:    Worry: Not on file    Inability: Not on file  . Transportation needs:    Medical: Not on file    Non-medical: Not on file  Tobacco Use  . Smoking status: Former Smoker    Last attempt to quit: 06/06/1994    Years since quitting: 24.0  . Smokeless tobacco: Never Used  Substance and Sexual Activity  . Alcohol use: No  . Drug use: No  . Sexual activity: Yes  Lifestyle  . Physical activity:    Days per week: Not on file    Minutes per session: Not on file  . Stress: Not on file  Relationships  . Social connections:    Talks on phone: Not on file    Gets together: Not on file    Attends religious service: Not on file    Active member of club or organization: Not on file    Attends meetings of clubs or organizations: Not on file    Relationship status: Not on file  . Intimate partner violence:    Fear of  current or ex partner: Not on file    Emotionally abused: Not on file    Physically abused: Not on file    Forced sexual activity: Not on file  Other Topics Concern  . Not on file  Social History Narrative  . Not on file    Review of Systems: ROS is O/W negative except as mentioned in HPI.  Physical Exam: Vital signs in last 24 hours: Temp:  [97.9 F (36.6 C)-98.5 F (36.9 C)] 98.5 F (36.9 C) (01/08 1051) Pulse Rate:  [80-113] 80 (01/08 1051) Resp:  [12-18] 18 (01/08 1051) BP: (95-104)/(56-71) 102/67 (01/08 1051) SpO2:  [95 %-99 %] 95 % (01/08 1051) Weight:  [99.3 kg] 99.3 kg (01/08 0958) Last BM Date: 06/13/18 General:  Alert, Well-developed, well-nourished, pleasant and cooperative in NAD Head:  Normocephalic and atraumatic. Eyes:  Sclera clear, no icterus.   Conjunctiva pink. Ears:  Normal auditory acuity. Mouth:  No deformity or lesions.   Lungs:  Clear throughout to auscultation.  No wheezes, crackles, or rhonchi.  Heart:  Regular rate and rhythm; no murmurs, clicks, rubs, or gallops. Abdomen:  Soft, non-distended.  BS present.  Non-tender. Rectal:  Deferred.  Heme positive in the ED.  Msk:  Symmetrical without gross deformities. Pulses:  Normal pulses noted. Extremities:  Without clubbing or edema. Neurologic:  Alert and oriented x 4;  grossly normal neurologically. Skin:  Intact without significant lesions or rashes. Psych:  Alert and cooperative. Normal mood and affect.  Intake/Output this shift: Total I/O In: 1081.4 [I.V.:81.4; IV Piggyback:1000] Out: -   Lab Results: Recent Labs    06/13/18 0559 06/13/18 1139  WBC 9.0  --   HGB  9.0* 8.5*  HCT 28.8* 27.3*  PLT 273  --    BMET Recent Labs    06/13/18 0559  NA 140  K 4.7  CL 109  CO2 24  GLUCOSE 211*  BUN 64*  CREATININE 1.35*  CALCIUM 8.1*   LFT Recent Labs    06/13/18 0559  PROT 5.1*  ALBUMIN 3.0*  AST 13*  ALT 11  ALKPHOS 25*  BILITOT 0.8   PT/INR Recent Labs     06/13/18 0659  LABPROT 15.2  INR 1.21   Studies/Results: Dg Chest Port 1 View  Result Date: 06/13/2018 CLINICAL DATA:  Pain and dizziness EXAM: PORTABLE CHEST 1 VIEW COMPARISON:  June 14, 2010. FINDINGS: There is bibasilar atelectasis. Lungs elsewhere clear. Heart size and pulmonary vascularity are normal. No adenopathy. There is evidence of an old healed fracture of the right clavicle. IMPRESSION: Bibasilar atelectasis. Lungs elsewhere clear. Stable cardiac silhouette. Electronically Signed   By: Lowella Grip III M.D.   On: 06/13/2018 07:29   IMPRESSION:  *78 year old male with reports of abdominal pain, vomiting of dark material, black stools starting Monday evening.  Was heme positive in the ED.  Hgb 9.0 grams (last for comparison was over one year ago at which time it was 11.7 grams). *Chronic antiplatelet use with Plavix due to history of CAD:  Last dose 1/7 AM. *Chronic NSAID use with BID Mobic for several months:  Not on any PPI at home.  PLAN: -Will plan for EGD on 1/9 with Dr. Carlean Purl to rule out ulcer, etc. -Can have clear liquids today then NPO after midnight. -Monitor Hgb and transfuse prn. -Agree with IV BID pantoprazole for now.   Laban Emperor. Zehr  06/13/2018, 12:49 PM

## 2018-06-13 NOTE — ED Provider Notes (Signed)
Medical screening examination/treatment/procedure(s) were conducted as a shared visit with non-physician practitioner(s) and myself.  I personally evaluated the patient during the encounter. Briefly, the patient is a 78 y.o. male with history of coronary artery disease, high cholesterol, depression, C. difficile who presents to the ED with emesis, diarrhea, abdominal pain, black stools.  Patient with unremarkable vitals.  Symptoms for the last 2 days.  Has some diffuse tenderness on abdominal exam.  Had melena per my physician assistant on exam.  Positive occult.  Overall well-appearing upon my examination after IV fluids have been given.  No signs of urinary tract infection.  Hemoglobin 9.  Upright chest x-ray shows no free air.  GI consulted for concern for GI bleed.  Will admit for further care.  With dynamically stable throughout my care.  This chart was dictated using voice recognition software.  Despite best efforts to proofread,  errors can occur which can change the documentation meaning.    EKG Interpretation  Date/Time:  Wednesday June 13 2018 06:03:08 EST Ventricular Rate:  104 PR Interval:    QRS Duration: 119 QT Interval:  367 QTC Calculation: 483 R Axis:   -46 Text Interpretation:  Sinus tachycardia LAD, consider left anterior fascicular block Anteroseptal infarct, old Abnormal T, consider ischemia, lateral leads Previously Sinus Bradycarida Confirmed by Shanon Rosser [phone]) on 06/13/2018 6:09:02 AM Also confirmed by Shanon Rosser [phone]), editor Alanda Slim, Levada Dy [phone])  on 06/13/2018 6:53:41 AM           Lennice Sites, DO 06/13/18 [phone]

## 2018-06-13 NOTE — H&P (Addendum)
History and Physical    [name]:[id number] DOB: 1941/05/05 DOA: 06/13/2018  PCP: Timothy Seashore, MD   Patient coming from: Home  I have personally briefly reviewed patient's old medical records in [place]  Chief Complaint: black tarry stool  HPI: [name] is a 78 y.o. male with medical history significant of hyperlipidemia, hypertension, CAD with multiple stents and chronic systolic heart failure with an EF of 40 to 45% who presents to the ER with reports of general malaise and black tarry watery stool.  In addition to recently losing his wife of 21 years on dec 1, patient reports been ongoing for several days and associated with abdominal pain decreased energy and decreased appetite.  As a result patient presented to the ER for further eval and treatment.  ED Course: In the ER patient was heme positive, hemoglobin was 9.0 down from 11.7 on January 19, 2017 or most recent comparison, his chest x-ray was unremarkable other than bibasal atelectasis, urine was clear.  The case was discussed with GI ER as patient follows with Dr. Ardis Nichols.  They will see in consultation.  Patient is hemodynamically stable and there is no indication for transfusion  Review of Systems: As per HPI otherwise 10 point review of systems negative.   Past Medical History:  Diagnosis Date  . Adenomatous colon polyp   . Arthritis   . CAD (coronary artery disease)    a. MI in 1996 with stent placement b. cath 01/18/17 - S/p PCI of in-stent restenosis of pLAD with cutting ballon & DES; 20% ostial LAD; 40% pro Cx; 60% focal pRCA  . Clostridium difficile infection   . Depression   . Diverticulosis   . H. pylori infection   . HTN (hypertension)   . Hyperlipidemia   . MI (myocardial infarction) [facility])     Past Surgical History:  Procedure Laterality Date  . BRAIN TUMOR EXCISION  1954   benign tumor in back of head, done at  [facility]  . CARDIAC CATHETERIZATION    . COLONOSCOPY     hx of polyps    . [place]; 1997; 01/18/2017  . CORONARY STENT INTERVENTION N/A 01/18/2017   Procedure: CORONARY STENT INTERVENTION;  Surgeon: Troy Sine, MD;  Location: [place] [facility];  Service: Cardiovascular;  Laterality: N/A;  . JOINT REPLACEMENT    . LEFT HEART CATH AND CORONARY ANGIOGRAPHY N/A 01/18/2017   Procedure: LEFT HEART CATH AND CORONARY ANGIOGRAPHY;  Surgeon: Troy Sine, MD;  Location: [place] [facility];  Service: Cardiovascular;  Laterality: N/A;  . stent x2 in the LAD  with intra-aortic balloon pump support  1996  . TONSILLECTOMY    . TOTAL KNEE ARTHROPLASTY Bilateral      reports that he quit smoking about 24 years ago. He has never used smokeless tobacco. He reports that he does not drink alcohol or use drugs.  No Known Allergies  Family History  Problem Relation Age of Onset  . Heart attack Mother        19s  . Heart attack Brother 62  . Cancer Sister        unsure of type  . Colon cancer Neg Hx   . Stomach cancer Neg Hx   . Rectal cancer Neg Hx   . Esophageal cancer Neg Hx      Prior to Admission medications   Medication Sig Start Date End Date Taking? Authorizing Provider  atorvastatin (LIPITOR)  80 MG tablet Take 80 mg by mouth daily.     Yes [provider]  clopidogrel (PLAVIX) 75 MG tablet Take 1 tablet (75 mg total) by mouth daily. 04/12/18 04/12/19 Yes Nichols, Bhavinkumar, PA  fenofibrate (TRICOR) 145 MG tablet TAKE 1 TABLET BY MOUTH EVERY DAY Patient taking differently: Take 145 mg by mouth daily.  04/04/18  Yes Nichols, Timothy Cheng, MD  meloxicam (MOBIC) 7.5 MG tablet Take 7.5 mg by mouth 2 (two) times daily. 03/16/18  Yes [provider]  nitroGLYCERIN (NITROSTAT) 0.4 MG SL tablet Place 1 tablet (0.4 mg total) under the tongue every 5 (five) minutes as needed for chest pain. 01/19/17  Yes Carlota Nichols, Tiffany, PA-C  sodium-potassium bicarbonate (ALKA-SELTZER GOLD) TBEF dissolvable tablet Take 1 tablet by  mouth as needed (acid reflux symptoms).   Yes [provider]  tiZANidine (ZANAFLEX) 4 MG tablet Take 1 tablet by mouth every 8 (eight) hours as needed for muscle spasms.  02/13/18  Yes [provider]  esomeprazole (NEXIUM) 40 MG capsule TAKE 1 CAPSULE BY MOUTH TWICE A DAY BEFORE A MEAL Patient not taking: No sig reported 10/24/17   Timothy Banister, MD  ezetimibe (ZETIA) 10 MG tablet Take 1 tablet (10 mg total) by mouth daily. Patient not taking: Reported on 06/13/2018 08/21/17   Nichols, Timothy Cheng, MD  furosemide (LASIX) 20 MG tablet Take 20 mg by mouth daily as needed for fluid or edema.    [provider]  HYDROcodone-acetaminophen (NORCO) 7.5-325 MG tablet Take 1 tablet by mouth daily as needed. For pain. 01/17/17   [provider]  isosorbide mononitrate (IMDUR) 30 MG 24 hr tablet Take 1 tablet (30 mg total) by mouth daily. Patient not taking: Reported on 06/13/2018 01/20/17   Timothy Haring, PA-C  metoprolol tartrate (LOPRESSOR) 25 MG tablet Take 0.5 tablets (12.5 mg total) by mouth 2 (two) times daily. Patient not taking: Reported on 06/13/2018 01/19/17   Timothy Haring, PA-C  pantoprazole (PROTONIX) 20 MG tablet Take 1 tablet (20 mg total) by mouth daily. Patient not taking: Reported on 06/13/2018 01/19/17   Timothy Haring, PA-C  promethazine (PHENERGAN) 12.5 MG tablet Take 12.5 mg by mouth every 6 (six) hours as needed for nausea or vomiting.    [provider]    Physical Exam: Vitals:   06/13/18 0803 06/13/18 0930 06/13/18 0958 06/13/18 1051  BP: (!) 104/56 102/68  102/67  Pulse: 83 86  80  Resp: 14 12  18   Temp:    98.5 F (36.9 C)  TempSrc:    Oral  SpO2: 99% 99%  95%  Weight:   99.3 kg   Height:   5\' 11"  (1.803 m)     Constitutional: NAD, calm, comfortable Vitals:   06/13/18 0803 06/13/18 0930 06/13/18 0958 06/13/18 1051  BP: (!) 104/56 102/68  102/67  Pulse: 83 86  80  Resp: 14 12  18   Temp:    98.5 F (36.9 C)  TempSrc:    Oral    SpO2: 99% 99%  95%  Weight:   99.3 kg   Height:   5\' 11"  (1.803 m)    Eyes: PERRL, lids and conjunctivae normal ENMT: Mucous membranes are moist. Posterior pharynx clear of any exudate or lesions.Normal dentition.  Neck: normal, supple, no masses, no thyromegaly Respiratory: clear to auscultation bilaterally, no wheezing, no crackles. Normal respiratory effort. No accessory muscle use.  Cardiovascular: Regular rate and rhythm, no murmurs / rubs / gallops. No extremity edema.  2+ pedal pulses. No carotid bruits.  Abdomen: mild suprapubic tenderness, no masses palpated. No hepatosplenomegaly. Bowel sounds positive.  Musculoskeletal: no clubbing / cyanosis. No joint deformity upper and lower extremities. Good ROM, no contractures. Normal muscle tone.  Skin: no rashes, lesions, ulcers. No induration Neurologic: CN 2-12 grossly intact. Sensation intact, DTR normal. Strength 5/5 in all 4.  Psychiatric: Normal judgment and insight. Alert and oriented x 3. Normal mood.    Labs on Admission: I have personally reviewed following labs and imaging studies  CBC: Recent Labs  Lab 06/13/18 0559  WBC 9.0  HGB 9.0*  HCT 28.8*  MCV 98.0  PLT 494   Basic Metabolic Panel: Recent Labs  Lab 06/13/18 0559  NA 140  K 4.7  CL 109  CO2 24  GLUCOSE 211*  BUN 64*  CREATININE 1.35*  CALCIUM 8.1*   GFR: Estimated Creatinine Clearance: 55.9 mL/min (A) (by C-G formula based on SCr of 1.35 mg/dL (H)). Liver Function Tests: Recent Labs  Lab 06/13/18 0559  AST 13*  ALT 11  ALKPHOS 25*  BILITOT 0.8  PROT 5.1*  ALBUMIN 3.0*   Recent Labs  Lab 06/13/18 0559  LIPASE 30   No results for input(s): AMMONIA in the last 168 hours. Coagulation Profile: Recent Labs  Lab 06/13/18 0659  INR 1.21   Cardiac Enzymes: No results for input(s): CKTOTAL, CKMB, CKMBINDEX, TROPONINI in the last 168 hours. BNP (last 3 results) No results for input(s): PROBNP in the last 8760 hours. HbA1C: No results  for input(s): HGBA1C in the last 72 hours. CBG: No results for input(s): GLUCAP in the last 168 hours. Lipid Profile: No results for input(s): CHOL, HDL, LDLCALC, TRIG, CHOLHDL, LDLDIRECT in the last 72 hours. Thyroid Function Tests: No results for input(s): TSH, T4TOTAL, FREET4, T3FREE, THYROIDAB in the last 72 hours. Anemia Panel: No results for input(s): VITAMINB12, FOLATE, FERRITIN, TIBC, IRON, RETICCTPCT in the last 72 hours. Urine analysis:    Component Value Date/Time   COLORURINE YELLOW 06/13/2018 0630   APPEARANCEUR CLEAR 06/13/2018 0630   LABSPEC 1.020 06/13/2018 0630   PHURINE 5.0 06/13/2018 0630   GLUCOSEU 50 (A) 06/13/2018 0630   HGBUR NEGATIVE 06/13/2018 0630   BILIRUBINUR NEGATIVE 06/13/2018 0630   KETONESUR 5 (A) 06/13/2018 0630   PROTEINUR NEGATIVE 06/13/2018 0630   UROBILINOGEN 1.0 06/14/2010 1330   NITRITE NEGATIVE 06/13/2018 0630   LEUKOCYTESUR NEGATIVE 06/13/2018 0630    Radiological Exams on Admission: Dg Chest Port 1 View  Result Date: 06/13/2018 CLINICAL DATA:  Pain and dizziness EXAM: PORTABLE CHEST 1 VIEW COMPARISON:  June 14, 2010. FINDINGS: There is bibasilar atelectasis. Lungs elsewhere clear. Heart size and pulmonary vascularity are normal. No adenopathy. There is evidence of an old healed fracture of the right clavicle. IMPRESSION: Bibasilar atelectasis. Lungs elsewhere clear. Stable cardiac silhouette. Electronically Signed   By: Lowella Grip III M.D.   On: 06/13/2018 07:29    EKG: Independently reviewed.   Assessment/Plan Principal Problem:   Acute GI bleeding Active Problems:   Hyperlipidemia   Hypertension   Coronary atherosclerosis   Dizziness   Status post coronary artery stent placement: a. (01/18/17) PCI and DES to LAD, EF 40-45%   GI bleeding.  Patient hemodynamically stable although mildly soft with systolic stable in the 496P over 50s to 70s, GI is going to see the patient in consultation.  No indication for transfusion.  Will  check hemoglobin and hematocrit every 8 hours   Protonix 40 IV twice daily.  Normal saline hydration at 100/h, clear liquids for now, npo after midnight  Hyperlipidemia hold patient's home medications he can continue these on discharge  Hypertension.  Patient blood pressure mildly soft will monitor blood pressure and titrate medications as indicated.  CAD.  Hold anticoagulation, given patient's GI bleeding, restart when cleared by GI, patient's not having any anginal symptomatology  Dizziness.  Appears to be moderately chronic in this patient, hydrate the patient gently, as blood pressure improves anticipate this will resolve.  Can add Antivert if needed.  DVT prophylaxis: SCDs holding chemical anticoagulation secondary to GI bleeding Code Status: Full code Family Communication: No family members currently available at time of admission.  Tried reaching his son Aaron Edelman, no answer left message Disposition Plan: To home Consults called: [facility] discussed case with ER consult pending Admission status: inpt   Nicolette Bang MD [facility] Pager [phone]  If 7PM-7AM, please contact night-coverage [email or web address] Password [email or web address]  06/13/2018, 11:01 AM

## 2018-06-13 NOTE — ED Notes (Signed)
Patient complains of lower bilateral abd pain, n/v, and dark liquid stools that started around 4pm today. 4mg  Zofran given in route that relieved nausea "some". Also reports dizziness when moving or standing, "like room is spinning". Last ate a banana 2-3 hours ago.

## 2018-06-13 NOTE — ED Notes (Signed)
Patient had 1 episode of dark diarrhea

## 2018-06-13 NOTE — ED Notes (Signed)
ED TO INPATIENT HANDOFF REPORT  Name/Age/Gender [name] 78 y.o. male  Code Status Code Status History    Date Active Date Inactive Code Status Order ID Comments User Context   01/18/2017 1704 01/19/2017 1834 Full Code [id number]  Troy Sine, MD Inpatient      Home/SNF/Other Home  Chief Complaint abdominal pain; nausea; emesis  Level of Care/Admitting Diagnosis ED Disposition    ED Disposition Condition [facility] Area: [place] [[id number]]  Level of Care: Telemetry [5]  Admit to tele based on following criteria: Other see comments  Comments: gi bleed , mod hypotension  Diagnosis: Acute GI bleeding [[id number]]  Admitting Physician: Marcell Anger [[id number]]  Attending Physician: Marcell Anger [[id number]]  Estimated length of stay: past midnight tomorrow  Certification:: I certify this patient will need inpatient services for at least 2 midnights  PT Class (Do Not Modify): Inpatient [101]  PT Acc Code (Do Not Modify): Private [1]       Medical History Past Medical History:  Diagnosis Date  . Adenomatous colon polyp   . Arthritis   . CAD (coronary artery disease)    a. MI in 1996 with stent placement b. cath 01/18/17 - S/p PCI of in-stent restenosis of pLAD with cutting ballon & DES; 20% ostial LAD; 40% pro Cx; 60% focal pRCA  . Clostridium difficile infection   . Depression   . Diverticulosis   . H. pylori infection   . HTN (hypertension)   . Hyperlipidemia   . MI (myocardial infarction) ([place])     Allergies No Known Allergies  IV Location/Drains/Wounds Patient Lines/Drains/Airways Status   Active Line/Drains/Airways    Name:   Placement date:   Placement time:   Site:   Days:   Peripheral IV 06/13/18   06/13/18    0535    -   less than 1   Wound 11/26/12 Laceration Hand Left pt has a 2in laceration to left hand.    11/26/12    1351    Hand   2025          Labs/Imaging Results for orders placed or  performed during the hospital encounter of 06/13/18 (from the past 48 hour(s))  Lipase, blood     Status: None   Collection Time: 06/13/18  5:59 AM  Result Value Ref Range   Lipase 30 11 - 51 U/L    Comment: Performed at [facility], [place] [address], [place], [place] [address]  Comprehensive metabolic panel     Status: Abnormal   Collection Time: 06/13/18  5:59 AM  Result Value Ref Range   Sodium 140 135 - 145 mmol/L   Potassium 4.7 3.5 - 5.1 mmol/L   Chloride 109 98 - 111 mmol/L   CO2 24 22 - 32 mmol/L   Glucose, Bld 211 (H) 70 - 99 mg/dL   BUN 64 (H) 8 - 23 mg/dL   Creatinine, Ser 1.35 (H) 0.61 - 1.24 mg/dL   Calcium 8.1 (L) 8.9 - 10.3 mg/dL   Total Protein 5.1 (L) 6.5 - 8.1 g/dL   Albumin 3.0 (L) 3.5 - 5.0 g/dL   AST 13 (L) 15 - 41 U/L   ALT 11 0 - 44 U/L   Alkaline Phosphatase 25 (L) 38 - 126 U/L   Total Bilirubin 0.8 0.3 - 1.2 mg/dL   GFR calc non Af Amer 51 (L) >60 mL/min   GFR calc Af Amer 59 (L) >  60 mL/min   Anion gap 7 5 - 15    Comment: Performed at [facility], [place] [address] [place], [place] [address]  CBC     Status: Abnormal   Collection Time: 06/13/18  5:59 AM  Result Value Ref Range   WBC 9.0 4.0 - 10.5 K/uL   RBC 2.94 (L) 4.22 - 5.81 MIL/uL   Hemoglobin 9.0 (L) 13.0 - 17.0 g/dL   HCT 28.8 (L) 39.0 - 52.0 %   MCV 98.0 80.0 - 100.0 fL   MCH 30.6 26.0 - 34.0 pg   MCHC 31.3 30.0 - 36.0 g/dL   RDW 15.0 11.5 - 15.5 %   Platelets 273 150 - 400 K/uL   nRBC 0.0 0.0 - 0.2 %    Comment: Performed at [facility], [place] [address] [place], [place] [address]  Urinalysis, Routine w reflex microscopic     Status: Abnormal   Collection Time: 06/13/18  6:30 AM  Result Value Ref Range   Color, Urine YELLOW YELLOW   APPearance CLEAR CLEAR   Specific Gravity, Urine 1.020 1.005 - 1.030   pH 5.0 5.0 - 8.0   Glucose, UA 50 (A) NEGATIVE mg/dL   Hgb urine dipstick NEGATIVE NEGATIVE   Bilirubin Urine NEGATIVE NEGATIVE    Ketones, ur 5 (A) NEGATIVE mg/dL   Protein, ur NEGATIVE NEGATIVE mg/dL   Nitrite NEGATIVE NEGATIVE   Leukocytes, UA NEGATIVE NEGATIVE    Comment: Performed at [place] [address], [place], [place] [address]  Protime-INR     Status: None   Collection Time: 06/13/18  6:59 AM  Result Value Ref Range   Prothrombin Time 15.2 11.4 - 15.2 seconds   INR 1.21     Comment: Performed at [facility], [place] [address] [place], [place] [address]  POC occult blood, ED Provider will collect     Status: Abnormal   Collection Time: 06/13/18  7:20 AM  Result Value Ref Range   Fecal Occult Bld POSITIVE (A) NEGATIVE  Type and screen [place]     Status: None   Collection Time: 06/13/18  7:28 AM  Result Value Ref Range   ABO/RH(D) A POS    Antibody Screen NEG    Sample Expiration      06/16/2018 Performed at [facility], [place] [address], [place], [place] [address]    Dg Chest Port 1 View  Result Date: 06/13/2018 CLINICAL DATA:  Pain and dizziness EXAM: PORTABLE CHEST 1 VIEW COMPARISON:  June 14, 2010. FINDINGS: There is bibasilar atelectasis. Lungs elsewhere clear. Heart size and pulmonary vascularity are normal. No adenopathy. There is evidence of an old healed fracture of the right clavicle. IMPRESSION: Bibasilar atelectasis. Lungs elsewhere clear. Stable cardiac silhouette. Electronically Signed   By: Lowella Grip III M.D.   On: 06/13/2018 07:29   EKG Interpretation  Date/Time:  Wednesday June 13 2018 06:03:08 EST Ventricular Rate:  104 PR Interval:    QRS Duration: 119 QT Interval:  367 QTC Calculation: 483 R Axis:   -46 Text Interpretation:  Sinus tachycardia LAD, consider left anterior fascicular block Anteroseptal infarct, old Abnormal T, consider ischemia, lateral leads Previously Sinus Bradycarida Confirmed by Shanon Rosser [phone]) on 06/13/2018 6:09:02 AM Also confirmed by Shanon Rosser [phone]),  editor Philomena Doheny [phone])  on 06/13/2018 6:53:41 AM   Pending Labs Unresulted Labs (From admission, onward)   None      Vitals/Pain Today's Vitals  06/13/18 0540 06/13/18 0637 06/13/18 0715 06/13/18 0803  BP: 95/71   (!) 104/56  Pulse: (!) 113   83  Resp: 18   14  Temp: 97.9 F (36.6 C)     TempSrc: Oral     SpO2: 98%   99%  PainSc:  7  0-No pain     Isolation Precautions No active isolations  Medications Medications  fentaNYL (SUBLIMAZE) injection 50 mcg (50 mcg Intravenous Given 06/13/18 0617)  ondansetron (ZOFRAN) injection 4 mg (4 mg Intravenous Given 06/13/18 0616)  sodium chloride 0.9 % bolus 1,000 mL (0 mLs Intravenous Stopped 06/13/18 0803)  pantoprazole (PROTONIX) injection 40 mg (40 mg Intravenous Given 06/13/18 0735)  promethazine (PHENERGAN) injection 12.5 mg (12.5 mg Intravenous Given 06/13/18 0832)    Mobility walks

## 2018-06-14 ENCOUNTER — Inpatient Hospital Stay (HOSPITAL_COMMUNITY): Payer: Medicare Other | Admitting: Anesthesiology

## 2018-06-14 ENCOUNTER — Encounter (HOSPITAL_COMMUNITY)
Admission: EM | Disposition: A | Discharge status: Home / Self Care | Payer: Self-pay | Source: Home / Self Care | Attending: Internal Medicine

## 2018-06-14 ENCOUNTER — Encounter (HOSPITAL_COMMUNITY): Payer: Self-pay | Admitting: *Deleted

## 2018-06-14 DIAGNOSIS — K922 Gastrointestinal hemorrhage, unspecified: Secondary | ICD-10-CM

## 2018-06-14 DIAGNOSIS — I200 Unstable angina: Secondary | ICD-10-CM

## 2018-06-14 DIAGNOSIS — K260 Acute duodenal ulcer with hemorrhage: Principal | ICD-10-CM

## 2018-06-14 DIAGNOSIS — D62 Acute posthemorrhagic anemia: Secondary | ICD-10-CM

## 2018-06-14 HISTORY — PX: ESOPHAGOGASTRODUODENOSCOPY (EGD) WITH PROPOFOL: SHX5813

## 2018-06-14 LAB — CBC
HCT: 27.6 % — ABNORMAL LOW (ref 39.0–52.0)
HEMOGLOBIN: 8.8 g/dL — AB (ref 13.0–17.0)
MCH: 30.7 pg (ref 26.0–34.0)
MCHC: 31.9 g/dL (ref 30.0–36.0)
MCV: 96.2 fL (ref 80.0–100.0)
Platelets: 189 10*3/uL (ref 150–400)
RBC: 2.87 MIL/uL — ABNORMAL LOW (ref 4.22–5.81)
RDW: 15.9 % — ABNORMAL HIGH (ref 11.5–15.5)
WBC: 8.1 10*3/uL (ref 4.0–10.5)
nRBC: 0 % (ref 0.0–0.2)

## 2018-06-14 LAB — BASIC METABOLIC PANEL
ANION GAP: 4 — AB (ref 5–15)
BUN: 55 mg/dL — ABNORMAL HIGH (ref 8–23)
CO2: 26 mmol/L (ref 22–32)
Calcium: 7.8 mg/dL — ABNORMAL LOW (ref 8.9–10.3)
Chloride: 112 mmol/L — ABNORMAL HIGH (ref 98–111)
Creatinine, Ser: 1.53 mg/dL — ABNORMAL HIGH (ref 0.61–1.24)
GFR calc non Af Amer: 44 mL/min — ABNORMAL LOW (ref >60)
GFR, EST AFRICAN AMERICAN: 50 mL/min — AB (ref >60)
Glucose, Bld: 119 mg/dL — ABNORMAL HIGH (ref 70–99)
Potassium: 4.6 mmol/L (ref 3.5–5.1)
Sodium: 142 mmol/L (ref 135–145)

## 2018-06-14 LAB — TROPONIN I: Troponin I: <0.03 ng/mL (ref <0.03)

## 2018-06-14 LAB — CBC WITH DIFFERENTIAL/PLATELET
Abs Immature Granulocytes: 0.03 10*3/uL (ref 0.00–0.07)
Basophils Absolute: 0 10*3/uL (ref 0.0–0.1)
Basophils Relative: 0 %
Eosinophils Absolute: 0.2 10*3/uL (ref 0.0–0.5)
Eosinophils Relative: 2 %
HCT: 22.3 % — ABNORMAL LOW (ref 39.0–52.0)
HEMOGLOBIN: 7 g/dL — AB (ref 13.0–17.0)
IMMATURE GRANULOCYTES: 0 %
LYMPHS PCT: 22 %
Lymphs Abs: 1.6 10*3/uL (ref 0.7–4.0)
MCH: 30.4 pg (ref 26.0–34.0)
MCHC: 31.4 g/dL (ref 30.0–36.0)
MCV: 97 fL (ref 80.0–100.0)
Monocytes Absolute: 0.8 10*3/uL (ref 0.1–1.0)
Monocytes Relative: 10 %
NEUTROS PCT: 66 %
Neutro Abs: 4.7 10*3/uL (ref 1.7–7.7)
Platelets: 194 10*3/uL (ref 150–400)
RBC: 2.3 MIL/uL — ABNORMAL LOW (ref 4.22–5.81)
RDW: 15.5 % (ref 11.5–15.5)
WBC: 7.2 10*3/uL (ref 4.0–10.5)
nRBC: 0 % (ref 0.0–0.2)

## 2018-06-14 LAB — PREPARE RBC (CROSSMATCH)

## 2018-06-14 SURGERY — ESOPHAGOGASTRODUODENOSCOPY (EGD) WITH PROPOFOL
Anesthesia: Monitor Anesthesia Care

## 2018-06-14 MED ORDER — PROPOFOL 500 MG/50ML IV EMUL
INTRAVENOUS | Status: DC | PRN
  Administered 2018-06-14: 75 ug/kg/min via INTRAVENOUS

## 2018-06-14 MED ORDER — SODIUM CHLORIDE 0.9 % IV SOLN
INTRAVENOUS | Status: DC
  Administered 2018-06-14: 500 mL via INTRAVENOUS

## 2018-06-14 MED ORDER — PROPOFOL 10 MG/ML IV BOLUS
INTRAVENOUS | Status: DC | PRN
  Administered 2018-06-14: 20 mg via INTRAVENOUS
  Administered 2018-06-14: 40 mg via INTRAVENOUS

## 2018-06-14 MED ORDER — PROPOFOL 10 MG/ML IV BOLUS
INTRAVENOUS | Status: AC
  Filled 2018-06-14: qty 40

## 2018-06-14 MED ORDER — PHENYLEPHRINE 40 MCG/ML (10ML) SYRINGE FOR IV PUSH (FOR BLOOD PRESSURE SUPPORT)
PREFILLED_SYRINGE | INTRAVENOUS | Status: DC | PRN
  Administered 2018-06-14 (×2): 80 ug via INTRAVENOUS

## 2018-06-14 MED ORDER — SODIUM CHLORIDE 0.9% IV SOLUTION: Freq: Once | INTRAVENOUS | Status: DC

## 2018-06-14 SURGICAL SUPPLY — 15 items

## 2018-06-14 NOTE — Anesthesia Preprocedure Evaluation (Signed)
Anesthesia Evaluation  Patient identified by MRN, date of birth, ID band Patient awake    Reviewed: Allergy & Precautions, NPO status , Patient's Chart, lab work & pertinent test results  Airway Mallampati: II  TM Distance: >3 FB Neck ROM: Full    Dental no notable dental hx.    Pulmonary neg pulmonary ROS, former smoker,    Pulmonary exam normal breath sounds clear to auscultation       Cardiovascular hypertension, + CAD, + Past MI and +CHF  Normal cardiovascular exam Rhythm:Regular Rate:Normal  EF 40%   Neuro/Psych negative neurological ROS  negative psych ROS   GI/Hepatic negative GI ROS, Neg liver ROS,   Endo/Other  negative endocrine ROS  Renal/GU negative Renal ROS  negative genitourinary   Musculoskeletal negative musculoskeletal ROS (+)   Abdominal   Peds negative pediatric ROS (+)  Hematology negative hematology ROS (+)   Anesthesia Other Findings   Reproductive/Obstetrics negative OB ROS                             Anesthesia Physical Anesthesia Plan  ASA: IV  Anesthesia Plan: MAC   Post-op Pain Management:    Induction: Intravenous  PONV Risk Score and Plan:   Airway Management Planned: Simple Face Mask  Additional Equipment:   Intra-op Plan:   Post-operative Plan:   Informed Consent: I have reviewed the patients History and Physical, chart, labs and discussed the procedure including the risks, benefits and alternatives for the proposed anesthesia with the patient or authorized representative who has indicated his/her understanding and acceptance.   Dental advisory given  Plan Discussed with: CRNA and Surgeon  Anesthesia Plan Comments:         Anesthesia Quick Evaluation

## 2018-06-14 NOTE — Transfer of Care (Signed)
Immediate Anesthesia Transfer of Care Note  Patient: TRUEMAN WORLDS  Procedure(s) Performed: Procedure(s): ESOPHAGOGASTRODUODENOSCOPY (EGD) WITH PROPOFOL (N/A)  Patient Location: PACU  Anesthesia Type:MAC  Level of Consciousness:  sedated, patient cooperative and responds to stimulation  Airway & Oxygen Therapy:Patient Spontanous Breathing and Patient connected to face mask oxgen  Post-op Assessment:  Report given to PACU RN and Post -op Vital signs reviewed and stable  Post vital signs:  Reviewed and stable  Last Vitals:  Vitals:   06/14/18 1110 06/14/18 1243  BP: (!) 104/58   Pulse:  65  Resp: 14 12  Temp: 36.8 C 36.8 C  SpO2: 22% 29%    Complications: No apparent anesthesia complications

## 2018-06-14 NOTE — Op Note (Signed)
[facility] Patient Name: [name] Procedure Date: 06/14/2018 MRN: [id number] Attending MD: Gatha Mayer , MD Date of Birth: 27-Nov-1940 CSN: [id number] Age: 78 Admit Type: Inpatient Procedure:                Upper GI endoscopy Indications:              Coffee-ground emesis, Melena Providers:                Gatha Mayer, MD, Elmer Ramp. Tilden Dome, RN, Cherylynn Ridges, Technician, Anne Fu CRNA, CRNA Referring MD:              Medicines:                Propofol per Anesthesia, Monitored Anesthesia Care Complications:            No immediate complications. Estimated Blood Loss:     Estimated blood loss was minimal. Estimated blood                            loss was minimal. Procedure:                Pre-Anesthesia Assessment:                           - Prior to the procedure, a History and Physical                            was performed, and patient medications and                            allergies were reviewed. The patient's tolerance of                            previous anesthesia was also reviewed. The risks                            and benefits of the procedure and the sedation                            options and risks were discussed with the patient.                            All questions were answered, and informed consent                            was obtained. Prior Anticoagulants: The patient                            last took Plavix (clopidogrel) 2 days and previous                            NSAID medication 2 days prior to the procedure. ASA  Grade Assessment: III - A patient with severe                            systemic disease. After reviewing the risks and                            benefits, the patient was deemed in satisfactory                            condition to undergo the procedure.                           After obtaining informed consent, the endoscope was                          passed under direct vision. Throughout the                            procedure, the patient's blood pressure, pulse, and                            oxygen saturations were monitored continuously. The                            GIF-H190 ([id number]) Olympus adult endoscope was                            introduced through the mouth, and advanced to the                            second part of duodenum. The upper GI endoscopy was                            accomplished without difficulty. The patient                            tolerated the procedure well. Scope In: Scope Out: Findings:      Two non-bleeding cratered duodenal ulcers with pigmented material were       found in the duodenal bulb. The largest lesion was 20 mm in largest       dimension.      Patchy mildly erythematous mucosa without bleeding was found in the       prepyloric region of the stomach.      The exam was otherwise without abnormality.      The cardia and gastric fundus were normal on retroflexion.      Biopsies were taken with a cold forceps in the gastric antrum for       Helicobacter pylori testing using CLOtest. Verification of patient       identification for the specimen was done. Estimated blood loss was       minimal. Impression:               - Multiple non-bleeding duodenal ulcers with  pigmented material.                           - Erythematous mucosa in the prepyloric region of                            the stomach.                           - The examination was otherwise normal.                           - No specimens collected. Moderate Sedation:      Not Applicable - Patient had care per Anesthesia. Recommendation:           - Return patient to hospital ward for ongoing care.                           - Cardiac diet.                           - Transition to bid pantoprazole in AM and go home                            on that x 1 month then go  to daily PPI forever                           Stay off clopidogrel x 2 weeks                           Avoid NSAIDS (he had stopped his daily PPI and been                            taking meloxicam)                           Observe and home in 1-2 days more depending upon                            clinical course                           will see tomorrow                           Treat H pylori if + - hx tx in past Procedure Code(s):        --- Professional ---                           [id number], Esophagogastroduodenoscopy, flexible,                            transoral; with biopsy, single or multiple Diagnosis Code(s):        --- Professional ---  K26.9, Duodenal ulcer, unspecified as acute or                            chronic, without hemorrhage or perforation                           K31.89, Other diseases of stomach and duodenum                           K92.0, Hematemesis                           K92.1, Melena (includes Hematochezia) CPT copyright 2018 American Medical Association. All rights reserved. The codes documented in this report are preliminary and upon coder review may  be revised to meet current compliance requirements. Gatha Mayer, MD 06/14/2018 12:52:54 PM This report has been signed electronically. Number of Addenda: 0

## 2018-06-14 NOTE — Interval H&P Note (Signed)
History and Physical Interval Note:  06/14/2018 12:17 PM  [name]  has presented today for surgery, with the diagnosis of Anemia, melena, heme positive stool  The various methods of treatment have been discussed with the patient and family. After consideration of risks, benefits and other options for treatment, the patient has consented to  Procedure(s): ESOPHAGOGASTRODUODENOSCOPY (EGD) WITH PROPOFOL (N/A) as a surgical intervention .  The patient's history has been reviewed, patient examined, no change in status, stable for surgery.  I have reviewed the patient's chart and labs.  Questions were answered to the patient's satisfaction.     Silvano Rusk

## 2018-06-14 NOTE — Progress Notes (Signed)
Hgb of 7 reported to endo RN, Angela Cox M. Brigitte Pulse, RN

## 2018-06-14 NOTE — Anesthesia Postprocedure Evaluation (Signed)
**Note Timothy-Identified via Obfuscation** Anesthesia Post Note  Patient: [name]  Procedure(s) Performed: ESOPHAGOGASTRODUODENOSCOPY (EGD) WITH PROPOFOL (N/A )     Patient location during evaluation: PACU Anesthesia Type: MAC Level of consciousness: awake and alert Pain management: pain level controlled Vital Signs Assessment: post-procedure vital signs reviewed and stable Respiratory status: spontaneous breathing, nonlabored ventilation, respiratory function stable and patient connected to nasal cannula oxygen Cardiovascular status: stable and blood pressure returned to baseline Postop Assessment: no apparent nausea or vomiting Anesthetic complications: no    Last Vitals:  Vitals:   06/14/18 1334 06/14/18 1404  BP: 113/72 102/63  Pulse: 63 64  Resp: 16 16  Temp: 36.9 C 36.7 C  SpO2: 100% 97%    Last Pain:  Vitals:   06/14/18 1404  TempSrc: Oral  PainSc:                  Genella Bas S

## 2018-06-14 NOTE — Progress Notes (Signed)
[facility] PROGRESS NOTE  [name] ESP:[id number] DOB: 20-Aug-1940 DOA: 06/13/2018   PCP: [name], MD     Assessment/Plan: Principal Problem:   Acute GI bleeding Active Problems:   Hyperlipidemia   Hypertension   Coronary atherosclerosis   Dizziness   Status post coronary artery stent placement: a. (01/18/17) PCI and DES to LAD, EF 40-45%   Gastrointestinal hemorrhage with melena   Acute blood loss anemia   Chest pain   [name] is a 78 y.o. male with medical history significant of hyperlipidemia, hypertension, CAD with multiple stents and chronic systolic heart failure with an EF of 40 to 45% who presents to the ER with reports of general malaise and black tarry watery stool Anticipated to have EGD today. Also receiving 2 units of packed red blood cells.  Assessment and plan GI bleeding-acute blood loss anemia Patient hemodynamically stable although mildly soft with systolic stable in the 633H over 50s to 70s, GI has plan for EGD today..hemoglobin down to 7.0. Will transfuse 2 units of packed red blood cells given history of CAD, transfuse to hemoglobin of greater than 8. continue Protonix 40 IV twice daily.  Normal saline hydration at 100/h, npo for procedure  Hyperlipidemia hold patient's home medications he can continue these on discharge  Hypertension.  Patient blood pressure mildly soft will monitor blood pressure and titrate medications as indicated.  CAD.  Hold Plavix given patient's GI bleeding, restart when cleared by GI, patient's not having any anginal symptomatology. Troponin negative 1  Dizziness.  Appears to be moderately chronic in this patient, hydrate the patient gently, as blood pressure improves anticipate this will resolve.  Can add Antivert if needed.     DVT prophylaxsis SCDs  Code Status:  Full code     Family Communication: Discussed in detail with the patient, all imaging results, lab results explained to  the patient   Disposition Plan:   Anticipate discharge in 1-2 days once cleared by gastroenterology      Consultants:  gastroenterology  Procedures:  none  Antibiotics: Anti-infectives (From admission, onward)   None         HPI/Subjective: No obvious hematochezia or melena since admission  Objective: Vitals:   06/13/18 1051 06/13/18 1358 06/13/18 2032 06/14/18 0458  BP: 102/67 113/82 101/70 109/63  Pulse: 80 83 82 71  Resp: 18 19 19 18   Temp: 98.5 F (36.9 C) 99.5 F (37.5 C) 99.6 F (37.6 C) 98.3 F (36.8 C)  TempSrc: Oral Oral Oral Oral  SpO2: 95% 98% 94% 92%  Weight:      Height:        Intake/Output Summary (Last 24 hours) at 06/14/2018 0930 Last data filed at 06/14/2018 0600 Gross per 24 hour  Intake 2391.39 ml  Output 500 ml  Net 1891.39 ml    Exam:  Examination:  General exam: Appears calm and comfortable  Respiratory system: Clear to auscultation. Respiratory effort normal. Cardiovascular system: S1 & S2 heard, RRR. No JVD, murmurs, rubs, gallops or clicks. No pedal edema. Gastrointestinal system: Abdomen is nondistended, soft and nontender. No organomegaly or masses felt. Normal bowel sounds heard. Central nervous system: Alert and oriented. No focal neurological deficits. Extremities: Symmetric 5 x 5 power. Skin: No rashes, lesions or ulcers Psychiatry: Judgement and insight appear normal. Mood & affect appropriate.     Data Reviewed: I have personally reviewed following labs and imaging studies  Micro Results No results found for this  or any previous visit (from the past 240 hour(s)).  Radiology Reports Dg Chest Port 1 View  Result Date: 06/13/2018 CLINICAL DATA:  Pain and dizziness EXAM: PORTABLE CHEST 1 VIEW COMPARISON:  June 14, 2010. FINDINGS: There is bibasilar atelectasis. Lungs elsewhere clear. Heart size and pulmonary vascularity are normal. No adenopathy. There is evidence of an old healed fracture of the right clavicle.  IMPRESSION: Bibasilar atelectasis. Lungs elsewhere clear. Stable cardiac silhouette. Electronically Signed   By: Lowella Grip III M.D.   On: 06/13/2018 07:29     CBC Recent Labs  Lab 06/13/18 0559 06/13/18 1139 06/14/18 0357  WBC 9.0  --  7.2  HGB 9.0* 8.5* 7.0*  HCT 28.8* 27.3* 22.3*  PLT 273  --  194  MCV 98.0  --  97.0  MCH 30.6  --  30.4  MCHC 31.3  --  31.4  RDW 15.0  --  15.5  LYMPHSABS  --   --  1.6  MONOABS  --   --  0.8  EOSABS  --   --  0.2  BASOSABS  --   --  0.0    Chemistries  Recent Labs  Lab 06/13/18 0559 06/14/18 0348  NA 140 142  K 4.7 4.6  CL 109 112*  CO2 24 26  GLUCOSE 211* 119*  BUN 64* 55*  CREATININE 1.35* 1.53*  CALCIUM 8.1* 7.8*  AST 13*  --   ALT 11  --   ALKPHOS 25*  --   BILITOT 0.8  --    ------------------------------------------------------------------------------------------------------------------ estimated creatinine clearance is 49.3 mL/min (A) (by C-G formula based on SCr of 1.53 mg/dL (H)). ------------------------------------------------------------------------------------------------------------------ No results for input(s): HGBA1C in the last 72 hours. ------------------------------------------------------------------------------------------------------------------ No results for input(s): CHOL, HDL, LDLCALC, TRIG, CHOLHDL, LDLDIRECT in the last 72 hours. ------------------------------------------------------------------------------------------------------------------ No results for input(s): TSH, T4TOTAL, T3FREE, THYROIDAB in the last 72 hours.  Invalid input(s): FREET3 ------------------------------------------------------------------------------------------------------------------ No results for input(s): VITAMINB12, FOLATE, FERRITIN, TIBC, IRON, RETICCTPCT in the last 72 hours.  Coagulation profile Recent Labs  Lab 06/13/18 0659  INR 1.21    No results for input(s): DDIMER in the last 72 hours.  Cardiac  Enzymes No results for input(s): CKMB, TROPONINI, MYOGLOBIN in the last 168 hours.  Invalid input(s): CK ------------------------------------------------------------------------------------------------------------------ Invalid input(s): POCBNP   CBG: No results for input(s): GLUCAP in the last 168 hours.     Studies: Dg Chest Port 1 View  Result Date: 06/13/2018 CLINICAL DATA:  Pain and dizziness EXAM: PORTABLE CHEST 1 VIEW COMPARISON:  June 14, 2010. FINDINGS: There is bibasilar atelectasis. Lungs elsewhere clear. Heart size and pulmonary vascularity are normal. No adenopathy. There is evidence of an old healed fracture of the right clavicle. IMPRESSION: Bibasilar atelectasis. Lungs elsewhere clear. Stable cardiac silhouette. Electronically Signed   By: Lowella Grip III M.D.   On: 06/13/2018 07:29      No results found for: HGBA1C Lab Results  Component Value Date   LDLCALC 88 04/12/2018   CREATININE 1.53 (H) 06/14/2018       Scheduled Meds: . sodium chloride   Intravenous Once  . pantoprazole (PROTONIX) IV  40 mg Intravenous Q12H  . sodium chloride flush  3 mL Intravenous Q12H   Continuous Infusions: . sodium chloride 100 mL/hr at 06/14/18 0811     LOS: 1 day    Time spent: >30 MINS    Reyne Dumas  [facility] Pager [phone]. If 7PM-7AM, please contact night-coverage at [email or web address], password [facility] 06/14/2018, 9:30 AM  LOS: 1 day

## 2018-06-15 ENCOUNTER — Encounter (HOSPITAL_COMMUNITY): Payer: Self-pay | Admitting: Internal Medicine

## 2018-06-15 LAB — CBC
HCT: 25.1 % — ABNORMAL LOW (ref 39.0–52.0)
HCT: 28.9 % — ABNORMAL LOW (ref 39.0–52.0)
HEMOGLOBIN: 8.1 g/dL — AB (ref 13.0–17.0)
Hemoglobin: 9.2 g/dL — ABNORMAL LOW (ref 13.0–17.0)
MCH: 30.6 pg (ref 26.0–34.0)
MCH: 30.9 pg (ref 26.0–34.0)
MCHC: 32.3 g/dL (ref 30.0–36.0)
MCHC: 31.8 g/dL (ref 30.0–36.0)
MCV: 94.7 fL (ref 80.0–100.0)
MCV: 97 fL (ref 80.0–100.0)
Platelets: 175 10*3/uL (ref 150–400)
Platelets: 173 10*3/uL (ref 150–400)
RBC: 2.65 MIL/uL — ABNORMAL LOW (ref 4.22–5.81)
RBC: 2.98 MIL/uL — ABNORMAL LOW (ref 4.22–5.81)
RDW: 16.3 % — ABNORMAL HIGH (ref 11.5–15.5)
RDW: 16.1 % — AB (ref 11.5–15.5)
WBC: 6.7 10*3/uL (ref 4.0–10.5)
WBC: 6.8 10*3/uL (ref 4.0–10.5)
nRBC: 0 % (ref 0.0–0.2)
nRBC: 0.4 % — ABNORMAL HIGH (ref 0.0–0.2)

## 2018-06-15 LAB — PREPARE RBC (CROSSMATCH)

## 2018-06-15 LAB — CLOTEST (H. PYLORI), BIOPSY: HELICOBACTER SCREEN: NEGATIVE

## 2018-06-15 MED ORDER — PANTOPRAZOLE SODIUM 40 MG PO TBEC: 40.0000 mg | DELAYED_RELEASE_TABLET | Freq: Two times a day (BID) | ORAL | 3 refills | Status: DC

## 2018-06-15 MED ORDER — PANTOPRAZOLE SODIUM 40 MG PO TBEC
40.0000 mg | DELAYED_RELEASE_TABLET | Freq: Two times a day (BID) | ORAL | Status: DC
  Administered 2018-06-15: 40 mg via ORAL
  Filled 2018-06-15: qty 1

## 2018-06-15 MED ORDER — CLOPIDOGREL BISULFATE 75 MG PO TABS: 75.0000 mg | ORAL_TABLET | Freq: Every day | ORAL | 3 refills | Status: AC

## 2018-06-15 MED ORDER — SODIUM CHLORIDE 0.9% IV SOLUTION: Freq: Once | INTRAVENOUS | Status: DC

## 2018-06-15 NOTE — Progress Notes (Signed)
Patient's IV (2) removed.  Sites WNL.  AVS reviewed with patient.  Verbalized understanding of discharge instructions, physician follow-up, medications.  Patient transported by NT via w/c to main entrance at discharge.  Patient stable at time of discharge.

## 2018-06-15 NOTE — Discharge Summary (Signed)
Physician Discharge Summary  [name] MRN: [id number] DOB/AGE: August 06, 1940 78 y.o.  PCP: [name], MD   Admit date: 06/13/2018 Discharge date: 06/15/2018  Discharge Diagnoses:   Principal Problem:   Acute GI bleeding Active Problems:   Hyperlipidemia   Hypertension   Coronary atherosclerosis   Dizziness   Status post coronary artery stent placement: a. (01/18/17) PCI and DES to LAD, EF 40-45%   Gastrointestinal hemorrhage with melena   Acute blood loss anemia   Chest pain   Acute duodenal ulcer with hemorrhage    Follow-up recommendations Follow-up with PCP in 3-5 days , including all  additional recommended appointments as below Follow-up CBC, CMP in 3-5 days Continue PPI twice a day for one month and then once a day forever Stay off Plavix for 2 weeks PCP to follow-up on the results of H. Pylori and treat if positive       Allergies as of 06/15/2018   No Known Allergies     Medication List    STOP taking these medications   esomeprazole 40 MG capsule Commonly known as:  NEXIUM   meloxicam 7.5 MG tablet Commonly known as:  MOBIC     TAKE these medications   atorvastatin 80 MG tablet Commonly known as:  LIPITOR Take 80 mg by mouth daily.   clopidogrel 75 MG tablet Commonly known as:  PLAVIX Take 1 tablet (75 mg total) by mouth daily. Start taking on:  July 02, 2018 What changed:  These instructions start on July 02, 2018. If you are unsure what to do until then, ask your doctor or other care provider.   ezetimibe 10 MG tablet Commonly known as:  ZETIA Take 1 tablet (10 mg total) by mouth daily.   fenofibrate 145 MG tablet Commonly known as:  TRICOR TAKE 1 TABLET BY MOUTH EVERY DAY   furosemide 20 MG tablet Commonly known as:  LASIX Take 20 mg by mouth daily as needed for fluid or edema.   HYDROcodone-acetaminophen 7.5-325 MG tablet Commonly known as:  NORCO Take 1 tablet by mouth daily as needed. For pain.   isosorbide  mononitrate 30 MG 24 hr tablet Commonly known as:  IMDUR Take 1 tablet (30 mg total) by mouth daily.   metoprolol tartrate 25 MG tablet Commonly known as:  LOPRESSOR Take 0.5 tablets (12.5 mg total) by mouth 2 (two) times daily.   nitroGLYCERIN 0.4 MG SL tablet Commonly known as:  NITROSTAT Place 1 tablet (0.4 mg total) under the tongue every 5 (five) minutes as needed for chest pain.   pantoprazole 40 MG tablet Commonly known as:  PROTONIX Take 1 tablet (40 mg total) by mouth 2 (two) times daily. What changed:    medication strength  how much to take  when to take this   promethazine 12.5 MG tablet Commonly known as:  PHENERGAN Take 12.5 mg by mouth every 6 (six) hours as needed for nausea or vomiting.   sodium-potassium bicarbonate Tbef dissolvable tablet Commonly known as:  ALKA-SELTZER GOLD Take 1 tablet by mouth as needed (acid reflux symptoms).   tiZANidine 4 MG tablet Commonly known as:  ZANAFLEX Take 1 tablet by mouth every 8 (eight) hours as needed for muscle spasms.        Discharge Condition:    Discharge Instructions Get Medicines reviewed and adjusted: Please take all your medications with you for your next visit with your Primary MD  Please request your Primary MD to go over all hospital tests and  procedure/radiological results at the follow up, please ask your Primary MD to get all Hospital records sent to his/her office.  If you experience worsening of your admission symptoms, develop shortness of breath, life threatening emergency, suicidal or homicidal thoughts you must seek medical attention immediately by calling 911 or calling your MD immediately  if symptoms less severe.  You must read complete instructions/literature along with all the possible adverse reactions/side effects for all the Medicines you take and that have been prescribed to you. Take any new Medicines after you have completely understood and accpet all the possible adverse  reactions/side effects.   Do not drive when taking Pain medications.   Do not take more than prescribed Pain, Sleep and Anxiety Medications  Special Instructions: If you have smoked or chewed Tobacco  in the last 2 yrs please stop smoking, stop any regular Alcohol  and or any Recreational drug use.  Wear Seat belts while driving.  Please note  You were cared for by a hospitalist during your hospital stay. Once you are discharged, your primary care physician will handle any further medical issues. Please note that NO REFILLS for any discharge medications will be authorized once you are discharged, as it is imperative that you return to your primary care physician (or establish a relationship with a primary care physician if you do not have one) for your aftercare needs so that they can reassess your need for medications and monitor your lab values.     No Known Allergies    Disposition: Discharge disposition: 01-Home or Self Care        Consults:  Gastroenterology    Significant Diagnostic Studies:  Dg Chest Port 1 View  Result Date: 06/13/2018 CLINICAL DATA:  Pain and dizziness EXAM: PORTABLE CHEST 1 VIEW COMPARISON:  June 14, 2010. FINDINGS: There is bibasilar atelectasis. Lungs elsewhere clear. Heart size and pulmonary vascularity are normal. No adenopathy. There is evidence of an old healed fracture of the right clavicle. IMPRESSION: Bibasilar atelectasis. Lungs elsewhere clear. Stable cardiac silhouette. Electronically Signed   By: Lowella Grip III M.D.   On: 06/13/2018 07:29       Filed Weights   06/13/18 0958 06/15/18 0709  Weight: 99.3 kg 99.7 kg     Microbiology: No results found for this or any previous visit (from the past 240 hour(s)).     Blood Culture No results found for: SDES, [place], CULT, REPTSTATUS    Labs: Results for orders placed or performed during the hospital encounter of 06/13/18 (from the past 48 hour(s))  Hemoglobin and  hematocrit, blood     Status: Abnormal   Collection Time: 06/13/18 11:39 AM  Result Value Ref Range   Hemoglobin 8.5 (L) 13.0 - 17.0 g/dL   HCT 27.3 (L) 39.0 - 52.0 %    Comment: Performed at [facility], [place] [address] [place], [place] [address]  Basic metabolic panel     Status: Abnormal   Collection Time: 06/14/18  3:48 AM  Result Value Ref Range   Sodium 142 135 - 145 mmol/L   Potassium 4.6 3.5 - 5.1 mmol/L   Chloride 112 (H) 98 - 111 mmol/L   CO2 26 22 - 32 mmol/L   Glucose, Bld 119 (H) 70 - 99 mg/dL   BUN 55 (H) 8 - 23 mg/dL   Creatinine, Ser 1.53 (H) 0.61 - 1.24 mg/dL   Calcium 7.8 (L) 8.9 - 10.3 mg/dL   GFR calc non Af Amer 44 (L) >  60 mL/min   GFR calc Af Amer 50 (L) >60 mL/min   Anion gap 4 (L) 5 - 15    Comment: Performed at [facility], [place] [address] [place], [place] [address]  CBC with Differential/Platelet     Status: Abnormal   Collection Time: 06/14/18  3:57 AM  Result Value Ref Range   WBC 7.2 4.0 - 10.5 K/uL   RBC 2.30 (L) 4.22 - 5.81 MIL/uL   Hemoglobin 7.0 (L) 13.0 - 17.0 g/dL   HCT 22.3 (L) 39.0 - 52.0 %   MCV 97.0 80.0 - 100.0 fL   MCH 30.4 26.0 - 34.0 pg   MCHC 31.4 30.0 - 36.0 g/dL   RDW 15.5 11.5 - 15.5 %   Platelets 194 150 - 400 K/uL   nRBC 0.0 0.0 - 0.2 %   Neutrophils Relative % 66 %   Neutro Abs 4.7 1.7 - 7.7 K/uL   Lymphocytes Relative 22 %   Lymphs Abs 1.6 0.7 - 4.0 K/uL   Monocytes Relative 10 %   Monocytes Absolute 0.8 0.1 - 1.0 K/uL   Eosinophils Relative 2 %   Eosinophils Absolute 0.2 0.0 - 0.5 K/uL   Basophils Relative 0 %   Basophils Absolute 0.0 0.0 - 0.1 K/uL   Immature Granulocytes 0 %   Abs Immature Granulocytes 0.03 0.00 - 0.07 K/uL    Comment: Performed at [facility], [place] [address], [place], [place] [address]  Prepare RBC     Status: None   Collection Time: 06/14/18  9:52 AM  Result Value Ref Range   Order Confirmation      ORDER PROCESSED BY BLOOD  BANK Performed at [facility], [place] [address] [place], [place] [address]   Troponin I - Once     Status: None   Collection Time: 06/14/18  9:52 AM  Result Value Ref Range   Troponin I <0.03 <0.03 ng/mL    Comment: Performed at [facility], [place] [address], [place], [place] [address]  CBC     Status: Abnormal   Collection Time: 06/14/18  6:35 PM  Result Value Ref Range   WBC 8.1 4.0 - 10.5 K/uL   RBC 2.87 (L) 4.22 - 5.81 MIL/uL   Hemoglobin 8.8 (L) 13.0 - 17.0 g/dL    Comment: REPEATED TO VERIFY POST TRANSFUSION SPECIMEN    HCT 27.6 (L) 39.0 - 52.0 %   MCV 96.2 80.0 - 100.0 fL   MCH 30.7 26.0 - 34.0 pg   MCHC 31.9 30.0 - 36.0 g/dL   RDW 15.9 (H) 11.5 - 15.5 %   Platelets 189 150 - 400 K/uL   nRBC 0.0 0.0 - 0.2 %    Comment: Performed at [facility], [place] [address] [place],  [address]  CBC     Status: Abnormal   Collection Time: 06/15/18  3:46 AM  Result Value Ref Range   WBC 6.7 4.0 - 10.5 K/uL   RBC 2.65 (L) 4.22 - 5.81 MIL/uL   Hemoglobin 8.1 (L) 13.0 - 17.0 g/dL   HCT 25.1 (L) 39.0 - 52.0 %   MCV 94.7 80.0 - 100.0 fL   MCH 30.6 26.0 - 34.0 pg   MCHC 32.3 30.0 - 36.0 g/dL   RDW 16.3 (H) 11.5 - 15.5 %   Platelets 175 150 - 400 K/uL   nRBC 0.0 0.0 - 0.2 %    Comment: Performed at [facility],  [place] [address] [place], [place] [address]     Lipid Panel     Component Value Date/Time   CHOL 148 04/12/2018 1025   TRIG 65 04/12/2018 1025   HDL 47 04/12/2018 1025   CHOLHDL 3.1 04/12/2018 1025   CHOLHDL 4 06/23/2009 1147   VLDL 23.4 06/23/2009 1147   LDLCALC 88 04/12/2018 1025   LDLDIRECT 159.1 12/10/2007 0851     No results found for: HGBA1C   Lab Results  Component Value Date   LDLCALC 88 04/12/2018   CREATININE 1.53 (H) 06/14/2018     HPI :  [name] is a 78 y.o. male with medical history significant of hyperlipidemia, hypertension, CAD with multiple stents and  chronic systolic heart failure with an EF of 40 to 45% who presents to the ER with reports of general malaise and black tarry watery stool.  In addition to recently losing his wife of 39 years on dec 1, patient reports been ongoing for several days and associated with abdominal pain decreased energy and decreased appetite.  As a result patient presented to the ER for further eval and treatment.  ED Course: In the ER patient was heme positive, hemoglobin was 9.0 down from 11.7 on January 19, 2017 or most recent comparison, his chest x-ray was unremarkable other than bibasal atelectasis, urine was clear.  The case was discussed with GI ER as patient follows with Dr. Ardis Hughs.  They will see in consultation.  Patient is hemodynamically stable and there is no indication for transfusion  HOSPITAL COURSE:  GI bleeding-acute blood loss anemia Patient noted to have soft systolic but stable BP in the 100s over 50s to 70s, GI performed EGD 1/9 that showed multiple nonbleeding duodenal ulcers. GI recommended Protonix twice a day for one month and then once a day forever. .hemoglobin was down to 7.0.patient received 3 units of packed red blood cells. Transfused because of  history of CAD, transfused to hemoglobin of greater than 8. PCP requested to follow-up on the results of H. Pylori and treat as appropriate   Hyperlipidemia resume home medications  Hypertension. Patient blood pressure mildly soft will monitor blood pressure and titrate medications as indicated.continue metoprolol  CAD. Hold Plavix 2 weeks given patient's GI bleeding, as per GI, patient's not having any anginal symptomatology. Troponin negative 1  Dizziness. Appears to be moderately chronic in this patient, hydrated the patient gently, and blood pressure parameters did improve after transfusion    Discharge Exam:   Blood pressure 106/60, pulse 69, temperature 98.7 F (37.1 C), temperature source Oral, resp. rate 18, height 5\' 11"   (1.803 m), weight 99.7 kg, SpO2 94 %.   Cardiovascular system: S1 & S2 heard, RRR. No JVD, murmurs, rubs, gallops or clicks. No pedal edema. Gastrointestinal system: Abdomen is nondistended, soft and nontender. No organomegaly or masses felt. Normal bowel sounds heard. Central nervous system: Alert and oriented. No focal neurological deficits. Extremities: Symmetric 5 x 5 power. Skin: No rashes, lesions or ulcersglucose  Follow-up Information    [name], MD. Call.   Specialty:  Internal Medicine Why:  call PCP and set up appointment in 3-5 days CBC, CMP in 3-5 days Contact information: [place] [address] [phone]        Nahser, Wonda Cheng, MD .   Specialty:  Cardiology Contact information: [place] [place] [address] [phone]           Signed: Reyne Dumas 06/15/2018,  8:02 AM      Time needed to  prepare  discharge, discussed with the patient and family 35 minutes

## 2018-06-15 NOTE — Progress Notes (Signed)
Late entry:  Patient reports bowel movement this morning.  Patient forgot and flushed the toilet.  Reports stool now brown in color.  Dr. Allyson Sabal on unit and notified.

## 2018-06-15 NOTE — Progress Notes (Signed)
Dr. Allyson Sabal notified via text page of CBC results.  Dr. Allyson Sabal called and stated patient may be discharged this evening per discharge orders.

## 2018-06-15 NOTE — Care Management Important Message (Signed)
Important Message  Patient Details  Name: [name] MRN: [id number] Date of Birth: 1940/09/19   Medicare Important Message Given:  Yes    Kerin Salen 06/15/2018, 1:02 [place] Message  Patient Details  Name: [name] MRN: [id number] Date of Birth: 1940/10/27   Medicare Important Message Given:  Yes    Kerin Salen 06/15/2018, 1:02 PM

## 2018-06-15 NOTE — Progress Notes (Addendum)
     [facility] Progress Note  CC:  GI bleed, duodenal ulcer  Subjective:  Had a brown BM today.  Hgb down just slightly today so getting another unit PRBC's then will likely be discharged later today.  EGD on 1/9 showed the following:  - Multiple non-bleeding duodenal ulcers with pigmented material. - Erythematous mucosa in the prepyloric region of the stomach. - The examination was otherwise normal. - No specimens collected.  CLO testing for Hpylori negative.  Objective:  Vital signs in last 24 hours: Temp:  [98 F (36.7 C)-98.7 F (37.1 C)] 98.1 F (36.7 C) (01/10 1118) Pulse Rate:  [63-74] 66 (01/10 1118) Resp:  [12-18] 18 (01/10 1118) BP: (87-117)/(39-72) 93/50 (01/10 1118) SpO2:  [90 %-100 %] 94 % (01/10 1118) Weight:  [99.7 kg] 99.7 kg (01/10 0709) Last BM Date: 06/15/18 General:  Alert, Well-developed, in NAD Heart:  Regular rate and rhythm; no murmurs Pulm:  CTAB.  No increased WOB. Abdomen:  Soft, non-distended.  BS present.  Non-tender. Extremities:  Without edema. Neurologic:  Alert and oriented x 4;  grossly normal neurologically. Psych:  Alert and cooperative. Normal mood and affect.  Intake/Output from previous day: 01/09 0701 - 01/10 0700 In: 1908.3 [P.O.:700; I.V.:486.3; Blood:722] Out: 100 [Urine:100]  Lab Results: Recent Labs    06/14/18 0357 06/14/18 1835 06/15/18 0346  WBC 7.2 8.1 6.7  HGB 7.0* 8.8* 8.1*  HCT 22.3* 27.6* 25.1*  PLT 194 189 175   BMET Recent Labs    06/13/18 0559 06/14/18 0348  NA 140 142  K 4.7 4.6  CL 109 112*  CO2 24 26  GLUCOSE 211* 119*  BUN 64* 55*  CREATININE 1.35* 1.53*  CALCIUM 8.1* 7.8*   LFT Recent Labs    06/13/18 0559  PROT 5.1*  ALBUMIN 3.0*  AST 13*  ALT 11  ALKPHOS 25*  BILITOT 0.8   PT/INR Recent Labs    06/13/18 0659  LABPROT 15.2  INR 1.21   Assessment / Plan: *78 year old male with reports of abdominal pain, vomiting of dark material, black stools starting  Monday evening.  Was heme positive in the ED.  Hgb 9.0 grams (last for comparison was over one year ago at which time it was 11.7 grams). *Duodenal bulb ulcers seen on EGD 1/9 with largest about 20 mm in size.  Hpylori negative. *Chronic antiplatelet use with Plavix due to history of CAD:  Last dose 1/7 AM. *Chronic NSAID use with BID Mobic for several months:  Not on any PPI at home.  -Continue BID PPI x one month then daily indefinitely. -Avoid ALL NSAID's. -Hold Plavix for 2 weeks. -Will follow-up with PCP for repeat labs.  No GI follow-up needed for this issue.   LOS: 2 days   Laban Emperor. Zehr  06/15/2018, 11:37 AM     GI Attending   I have taken an interval history, reviewed the chart and examined the patient. I agree with the Advanced Practitioner's note, impression and recommendations.    Gatha Mayer, MD, [facility] 06/15/2018 1:29 PM Pager [phone]

## 2018-06-18 ENCOUNTER — Ambulatory Visit: Payer: Medicare Other | Admitting: Physical Therapy

## 2018-06-18 LAB — BPAM RBC
Blood Product Expiration Date: 202001282359
Blood Product Expiration Date: 202001312359
Blood Product Expiration Date: 202002042359
ISSUE DATE / TIME: 202001091031
ISSUE DATE / TIME: 202001091344
ISSUE DATE / TIME: 202001101057
Unit Type and Rh: 6200
Unit Type and Rh: 6200
Unit Type and Rh: 6200

## 2018-06-18 LAB — TYPE AND SCREEN
ABO/RH(D): A POS
Antibody Screen: NEGATIVE
Unit division: 0
Unit division: 0
Unit division: 0

## 2018-06-19 ENCOUNTER — Ambulatory Visit (INDEPENDENT_AMBULATORY_CARE_PROVIDER_SITE_OTHER): Payer: Medicare Other | Admitting: Cardiovascular Disease

## 2018-06-19 ENCOUNTER — Encounter: Payer: Self-pay | Admitting: Cardiovascular Disease

## 2018-06-19 VITALS — BP 112/66 | HR 97 | Ht 71.0 in | Wt 220.4 lb

## 2018-06-19 DIAGNOSIS — E785 Hyperlipidemia, unspecified: Secondary | ICD-10-CM

## 2018-06-19 DIAGNOSIS — I2510 Atherosclerotic heart disease of native coronary artery without angina pectoris: Secondary | ICD-10-CM

## 2018-06-19 NOTE — Patient Instructions (Signed)

## 2018-06-19 NOTE — Progress Notes (Signed)
Cardiology Office Note:    Date:  06/19/2018   ID:  [name], DOB [birth year], MRN [id number]  PCP:  [name], MD  Cardiologist:  [name], MD    Referring MD: [name], MD   Chief Complaint  Patient presents with  . Coronary Artery Disease        [name] is a 78 y.o. male with a hx of Coronary artery disease been years ago. He is a previous patient of Dr. Bing Quarry. He presents now with symptoms of exertional chest pain and shortness breath.  He has shortness of breath at night or during the day  Walking 5-6 minutes causes significant dyspnea.  Also has claudication in his legs with walking   Has occasional chest pain  - usually when he is at rest. Has chest squeezing. - occurs randomly  Has lots of indigestion - occurs randomly   Also has lots of sweating - not necessarliy related to his chest tightness  does not feel similar to his previous MI pain .   No CP since that time   Hx of MI in 1996 -  Had stent by [name] Stopped smoking at that time   Does not exercise regularly    .   Works out in the yard.   Sept. 5, 2018: [name] is seen today for follow up visit.   S/p recent stenting of his prox - mid LAD  Hx of CAD , stenting by [name] in the past .   PTs BP has been low Has been on a diet and has been cutting back on his protein and his food in general .  Has lost 8.5 lbs   Had an episode of CP at night.   Better after NTG . Still fairly active .   Walks a little ,  Just started a walking program .   Walks 1/2 mile a day without CP  His medical doctor had recently started him on a BP med and he has stopped it now (does not remember the name of it)   Echo Aug. 28 shows normal LV function   August 18, 2017: [name] is seen today for follow-up of his coronary artery disease. He has a history of coronary artery disease and is status post stent placement to the left anterior descending artery in Feb 01, 2017 Overall he is doing  very well.  Is not had any episodes of angina since his PCI in 02/01/17.  He does have some shortness of breath with exertion.  He is exercising fairly regularly.  Wife is had problems and he has had to help her.  He is lost some weight since his angioplasty.    His last lipid level shows a triglyceride level of 229.  His total cholesterol is 158.  HDL is 39.  LDL 73.  Jan. 6, 2020  [name] is seen today for follow-up of his coronary artery disease.  He status post stent placement to the left anterior descending artery in 01-Feb-2017.  Wife passed away several months .  Pneumonia and sepsis. Had been married 22 years.    Has not had any angina      June 19, 2018  [name] was seen last week for follow-up of his coronary artery disease.  Several days later he was seen in the [place] long emergency room with a GI bleed. Is now on Protonox and Plavix is on hold until Jan. 27.   Stent placent was 02-01-17.  ECG revealed 2 ulcers - 1 large and 1 small  Seems to be getting better  Still has DOE    Past Medical History:  Diagnosis Date  . Adenomatous colon polyp   . Arthritis   . CAD (coronary artery disease)    a. MI in 1996 with stent placement b. cath 01/18/17 - S/p PCI of in-stent restenosis of pLAD with cutting ballon & DES; 20% ostial LAD; 40% pro Cx; 60% focal pRCA  . Clostridium difficile infection   . Depression   . Diverticulosis   . H. pylori infection   . HTN (hypertension)   . Hyperlipidemia   . MI (myocardial infarction) [facility])     Past Surgical History:  Procedure Laterality Date  . BRAIN TUMOR EXCISION  1954   benign tumor in back of head, done at  [facility]  . CARDIAC CATHETERIZATION    . COLONOSCOPY     hx of polyps  . [place]; 1997; 01/18/2017  . CORONARY STENT INTERVENTION N/A 01/18/2017   Procedure: CORONARY STENT INTERVENTION;  Surgeon: Troy Sine, MD;  Location: [facility];  Service: Cardiovascular;   Laterality: N/A;  . ESOPHAGOGASTRODUODENOSCOPY (EGD) WITH PROPOFOL N/A 06/14/2018   Procedure: ESOPHAGOGASTRODUODENOSCOPY (EGD) WITH PROPOFOL;  Surgeon: Gatha Mayer, MD;  Location: [facility];  Service: Endoscopy;  Laterality: N/A;  . JOINT REPLACEMENT    . LEFT HEART CATH AND CORONARY ANGIOGRAPHY N/A 01/18/2017   Procedure: LEFT HEART CATH AND CORONARY ANGIOGRAPHY;  Surgeon: Troy Sine, MD;  Location: [place] [facility];  Service: Cardiovascular;  Laterality: N/A;  . stent x2 in the LAD  with intra-aortic balloon pump support  1996  . TONSILLECTOMY    . TOTAL KNEE ARTHROPLASTY Bilateral     Current Medications: Current Meds  Medication Sig  . atorvastatin (LIPITOR) 80 MG tablet Take 80 mg by mouth daily.    Derrill Memo ON 07/02/2018] clopidogrel (PLAVIX) 75 MG tablet Take 1 tablet (75 mg total) by mouth daily.  Marland Kitchen ezetimibe (ZETIA) 10 MG tablet Take 1 tablet (10 mg total) by mouth daily.  . fenofibrate (TRICOR) 145 MG tablet TAKE 1 TABLET BY MOUTH EVERY DAY (Patient taking differently: Take 145 mg by mouth daily. )  . furosemide (LASIX) 20 MG tablet Take 20 mg by mouth daily as needed for fluid or edema.  Marland Kitchen HYDROcodone-acetaminophen (NORCO) 7.5-325 MG tablet Take 1 tablet by mouth daily as needed. For pain.  . isosorbide mononitrate (IMDUR) 30 MG 24 hr tablet Take 1 tablet (30 mg total) by mouth daily.  . metoprolol tartrate (LOPRESSOR) 25 MG tablet Take 0.5 tablets (12.5 mg total) by mouth 2 (two) times daily.  . nitroGLYCERIN (NITROSTAT) 0.4 MG SL tablet Place 1 tablet (0.4 mg total) under the tongue every 5 (five) minutes as needed for chest pain.  . pantoprazole (PROTONIX) 40 MG tablet Take 1 tablet (40 mg total) by mouth 2 (two) times daily.  . promethazine (PHENERGAN) 12.5 MG tablet Take 12.5 mg by mouth every 6 (six) hours as needed for nausea or vomiting.  . sodium-potassium bicarbonate (ALKA-SELTZER GOLD) TBEF dissolvable tablet Take 1 tablet by mouth as needed (acid reflux  symptoms).  Marland Kitchen tiZANidine (ZANAFLEX) 4 MG tablet Take 1 tablet by mouth every 8 (eight) hours as needed for muscle spasms.      Allergies:   Patient has no known allergies.   Social History   Socioeconomic History  . Marital status: Married  Spouse name: Not on file  . Number of children: 1  . Years of education: Not on file  . Highest education level: Not on file  Occupational History  . Occupation: Retired  Scientific laboratory technician  . Financial resource strain: Not on file  . Food insecurity:    Worry: Not on file    Inability: Not on file  . Transportation needs:    Medical: Not on file    Non-medical: Not on file  Tobacco Use  . Smoking status: Former Smoker    Last attempt to quit: 06/06/1994    Years since quitting: 24.0  . Smokeless tobacco: Never Used  Substance and Sexual Activity  . Alcohol use: No  . Drug use: No  . Sexual activity: Yes  Lifestyle  . Physical activity:    Days per week: Not on file    Minutes per session: Not on file  . Stress: Not on file  Relationships  . Social connections:    Talks on phone: Not on file    Gets together: Not on file    Attends religious service: Not on file    Active member of club or organization: Not on file    Attends meetings of clubs or organizations: Not on file    Relationship status: Not on file  Other Topics Concern  . Not on file  Social History Narrative  . Not on file     Family History: The patient's family history includes Cancer in his sister; Heart attack in his mother; Heart attack (age of onset: 60) in his brother. There is no history of Colon cancer, Stomach cancer, Rectal cancer, or Esophageal cancer. ROS:   Please see the history of present illness.     All other systems reviewed and are negative.  EKGs/Labs/Other Studies Reviewed:     Physical Exam: Blood pressure 112/66, pulse 97, height 5\' 11"  (1.803 m), weight 220 lb 6.4 oz (100 kg), SpO2 94 %.  GEN:  Well nourished, well developed in no acute  distress HEENT: Normal NECK: No JVD; No carotid bruits LYMPHATICS: No lymphadenopathy CARDIAC: RRR   RESPIRATORY:  Clear to auscultation without rales, wheezing or rhonchi  ABDOMEN: Soft, non-tender, non-distended MUSCULOSKELETAL:  No edema; No deformity  SKIN: Warm and dry NEUROLOGIC:  Alert and oriented x 3   EKG:      Recent Labs: 04/12/2018: Magnesium 1.9 06/13/2018: ALT 11 06/14/2018: BUN 55; Creatinine, Ser 1.53; Potassium 4.6; Sodium 142 06/15/2018: Hemoglobin 9.2; Platelets 173  Recent Lipid Panel    Component Value Date/Time   CHOL 148 04/12/2018 1025   TRIG 65 04/12/2018 1025   HDL 47 04/12/2018 1025   CHOLHDL 3.1 04/12/2018 1025   CHOLHDL 4 06/23/2009 1147   VLDL 23.4 06/23/2009 1147   LDLCALC 88 04/12/2018 1025   LDLDIRECT 159.1 12/10/2007 0851      ASSESSMENT: PLAN:    In order of problems listed above:  1. Coronary artery disease:    No angina .   Plavix is on hold for another 2 weeks.  I think it will be fine for him to restart the Plavix on January 27. Follow-up with a gastroenterologist for further advice regarding his GI bleeding.    2.  Chronic diastolic congestive heart failure.   His breathing seems to be well controlled.   3.  Hyperlipidemia:  Check lipds in 6 months          Medication Adjustments/Labs and Tests Ordered: Current medicines are reviewed at length  with the patient today.  Concerns regarding medicines are outlined above.  No orders of the defined types were placed in this encounter.  No orders of the defined types were placed in this encounter.   Signed, [name], MD  06/19/2018 1:53 PM    [place]

## 2018-06-20 DIAGNOSIS — K2971 Gastritis, unspecified, with bleeding: Secondary | ICD-10-CM | POA: Diagnosis not present

## 2018-06-20 DIAGNOSIS — I2510 Atherosclerotic heart disease of native coronary artery without angina pectoris: Secondary | ICD-10-CM | POA: Diagnosis not present

## 2018-06-20 DIAGNOSIS — D500 Iron deficiency anemia secondary to blood loss (chronic): Secondary | ICD-10-CM | POA: Diagnosis not present

## 2018-06-21 ENCOUNTER — Ambulatory Visit: Payer: Medicare Other | Admitting: Physical Therapy

## 2018-06-25 ENCOUNTER — Ambulatory Visit: Payer: Medicare Other | Admitting: Physical Therapy

## 2018-06-28 ENCOUNTER — Ambulatory Visit: Payer: Medicare Other | Admitting: Physical Therapy

## 2018-07-26 ENCOUNTER — Other Ambulatory Visit: Payer: Medicare Other | Admitting: *Deleted

## 2018-07-26 DIAGNOSIS — E785 Hyperlipidemia, unspecified: Secondary | ICD-10-CM

## 2018-07-26 DIAGNOSIS — I2510 Atherosclerotic heart disease of native coronary artery without angina pectoris: Secondary | ICD-10-CM

## 2018-07-26 LAB — HEPATIC FUNCTION PANEL
ALT: 12 IU/L (ref 0–44)
AST: 13 IU/L (ref 0–40)
Albumin: 4.1 g/dL (ref 3.7–4.7)
Alkaline Phosphatase: 49 IU/L (ref 39–117)
Bilirubin Total: 0.4 mg/dL (ref 0.0–1.2)
Bilirubin, Direct: 0.12 mg/dL (ref 0.00–0.40)
Total Protein: 6.6 g/dL (ref 6.0–8.5)

## 2018-07-26 LAB — BASIC METABOLIC PANEL
BUN/Creatinine Ratio: 19 (ref 10–24)
BUN: 25 mg/dL (ref 8–27)
CALCIUM: 9.4 mg/dL (ref 8.6–10.2)
CO2: 26 mmol/L (ref 20–29)
Chloride: 101 mmol/L (ref 96–106)
Creatinine, Ser: 1.3 mg/dL — ABNORMAL HIGH (ref 0.76–1.27)
GFR calc non Af Amer: 53 mL/min/{1.73_m2} — ABNORMAL LOW (ref >59)
GFR, EST AFRICAN AMERICAN: 61 mL/min/{1.73_m2} (ref >59)
Glucose: 114 mg/dL — ABNORMAL HIGH (ref 65–99)
Potassium: 4.2 mmol/L (ref 3.5–5.2)
Sodium: 142 mmol/L (ref 134–144)

## 2018-07-26 LAB — LIPID PANEL
Chol/HDL Ratio: 3.1 ratio (ref 0.0–5.0)
Cholesterol, Total: 148 mg/dL (ref 100–199)
HDL: 47 mg/dL (ref >39)
LDL Calculated: 84 mg/dL (ref 0–99)
Triglycerides: 83 mg/dL (ref 0–149)
VLDL Cholesterol Cal: 17 mg/dL (ref 5–40)

## 2018-07-27 ENCOUNTER — Other Ambulatory Visit: Payer: Medicare Other

## 2018-07-30 ENCOUNTER — Telehealth: Payer: Self-pay | Admitting: Cardiovascular Disease

## 2018-07-30 NOTE — Telephone Encounter (Signed)
New message   Patient is returning your call for lab results.

## 2018-07-30 NOTE — Telephone Encounter (Signed)
Reviewed lab results and plan to continue current medications with patient who verbalized understanding. He thanked me for the call.

## 2018-08-01 ENCOUNTER — Encounter: Payer: Self-pay | Admitting: Podiatrist

## 2018-08-01 ENCOUNTER — Other Ambulatory Visit: Payer: Self-pay | Admitting: Podiatrist

## 2018-08-01 ENCOUNTER — Ambulatory Visit (INDEPENDENT_AMBULATORY_CARE_PROVIDER_SITE_OTHER): Payer: Medicare Other | Admitting: Podiatrist

## 2018-08-01 ENCOUNTER — Ambulatory Visit (INDEPENDENT_AMBULATORY_CARE_PROVIDER_SITE_OTHER): Payer: Medicare Other

## 2018-08-01 VITALS — BP 158/78 | HR 76

## 2018-08-01 DIAGNOSIS — M79671 Pain in right foot: Secondary | ICD-10-CM

## 2018-08-01 DIAGNOSIS — M79609 Pain in unspecified limb: Secondary | ICD-10-CM

## 2018-08-01 DIAGNOSIS — I2510 Atherosclerotic heart disease of native coronary artery without angina pectoris: Secondary | ICD-10-CM | POA: Diagnosis not present

## 2018-08-01 DIAGNOSIS — M79672 Pain in left foot: Secondary | ICD-10-CM

## 2018-08-01 DIAGNOSIS — B351 Tinea unguium: Secondary | ICD-10-CM | POA: Diagnosis not present

## 2018-08-01 DIAGNOSIS — M722 Plantar fascial fibromatosis: Secondary | ICD-10-CM

## 2018-08-01 NOTE — Patient Instructions (Signed)

## 2018-08-01 NOTE — Progress Notes (Signed)
  Chief Complaint  Patient presents with  . Foot Pain    Pt states Left plantar foot pain 2wk duration worse in morning pain radiates into forefoot, no known injury.     HPI: Patient is 78 y.o. male who presents today for left heel pain which is painful with first step in the am and after sitting for long periods.  He also has trouble with his toenails due to their thickness and would like them trimmed today.  He relates pain in shoes with ambulation.    Review of Systems  DATA OBTAINED: from patient  GENERAL: Feels well no fevers, no fatigue, no changes in appetite SKIN: No itching, no rashes, no open wounds EYES: No eye pain,no redness, no discharge EARS: No earache,no ringing of ears, NOSE: No congestion, no drainage, no bleeding  MOUTH/THROAT: No mouth pain, No sore throat, No difficulty chewing or swallowing  RESPIRATORY: No cough, no wheezing, no SOB CARDIAC: No chest pain,no heart palpitations, GI: No abdominal pain, No Nausea, no vomiting, no diarrhea, no heartburn or no reflux  GU: No dysuria, no increased frequency or urgency MUSCULOSKELETAL: No unrelieved bone/joint pain,  NEUROLOGIC: Awake, alert, appropriate to situation, No change in mental status. PSYCHIATRIC: No overt anxiety or sadness.No behavior issue.      Physical Exam  GENERAL APPEARANCE: Alert, conversant. Appropriately groomed. No acute distress.  VASCULAR: Pedal pulses palpable DP and PT bilateral.  Capillary refill time is immediate to all digits,  Proximal to distal cooling it warm to warm.  Digital hair growth is present bilateral  NEUROLOGIC: sensation is intact epicritically and protectively to 5.07 monofilament at 5/5 sites bilateral.  Light touch is intact bilateral, vibratory sensation intact bilateral, achilles tendon reflex is intact bilateral.  MUSCULOSKELETAL: acceptable muscle strength, tone and stability bilateral.  Intrinsic muscluature intact bilateral.  Range of motion at ankle and first MPJ  is normal bilateral.  Pain plantar medial side of the left heel is noted with palpation.  Pain along the plantar fascia palpated. xrays negative for fracture.  Minimal spurring noted.   DERMATOLOGIC: skin is warm, supple, and dry.  No open lesions noted.  No interdigital maceration noted bilateral. Digital nails are long, thick, discolred, fragile and friable with pain on palpation and with debridement.     Assessment   Plantar fasciitis left Symptomatic onychomycosis x 10  Plan  Discussed treatment options and at this time a plantar fascial injection was recommended.  The patient agreed and a sterile skin prep was applied.  An injection consisting of kenalog and marcaine mixture was infiltrated at the point of maximal tenderness on the left Heel.  The patient tolerated this well and was given instructions for aftercare.    Onychoreduction of symptomatic toenails was performed without iatrogenic incident.  Patient was instructed on signs and symptoms of infection and was told to call immediately should any of these arise.

## 2018-08-03 ENCOUNTER — Ambulatory Visit: Payer: Medicare Other | Admitting: Podiatry

## 2018-08-22 DIAGNOSIS — D500 Iron deficiency anemia secondary to blood loss (chronic): Secondary | ICD-10-CM | POA: Diagnosis not present

## 2018-08-22 DIAGNOSIS — K2971 Gastritis, unspecified, with bleeding: Secondary | ICD-10-CM | POA: Diagnosis not present

## 2018-08-29 DIAGNOSIS — I2510 Atherosclerotic heart disease of native coronary artery without angina pectoris: Secondary | ICD-10-CM | POA: Diagnosis not present

## 2018-08-29 DIAGNOSIS — E1122 Type 2 diabetes mellitus with diabetic chronic kidney disease: Secondary | ICD-10-CM | POA: Diagnosis not present

## 2018-08-29 DIAGNOSIS — E1121 Type 2 diabetes mellitus with diabetic nephropathy: Secondary | ICD-10-CM | POA: Diagnosis not present

## 2018-08-29 DIAGNOSIS — F339 Major depressive disorder, recurrent, unspecified: Secondary | ICD-10-CM | POA: Diagnosis not present

## 2018-10-07 ENCOUNTER — Other Ambulatory Visit: Payer: Self-pay | Admitting: Cardiovascular Disease

## 2018-10-29 ENCOUNTER — Other Ambulatory Visit: Payer: Self-pay | Admitting: Cardiovascular Disease

## 2018-10-31 ENCOUNTER — Encounter: Payer: Self-pay | Admitting: Podiatry

## 2018-10-31 ENCOUNTER — Other Ambulatory Visit: Payer: Self-pay

## 2018-10-31 ENCOUNTER — Ambulatory Visit: Payer: Medicare Other | Admitting: Podiatrist

## 2018-10-31 ENCOUNTER — Ambulatory Visit (INDEPENDENT_AMBULATORY_CARE_PROVIDER_SITE_OTHER): Payer: Medicare Other | Admitting: Podiatry

## 2018-10-31 VITALS — Temp 97.7°F

## 2018-10-31 DIAGNOSIS — M722 Plantar fascial fibromatosis: Secondary | ICD-10-CM | POA: Diagnosis not present

## 2018-10-31 DIAGNOSIS — M79676 Pain in unspecified toe(s): Secondary | ICD-10-CM | POA: Diagnosis not present

## 2018-10-31 DIAGNOSIS — I2510 Atherosclerotic heart disease of native coronary artery without angina pectoris: Secondary | ICD-10-CM | POA: Diagnosis not present

## 2018-10-31 DIAGNOSIS — B351 Tinea unguium: Secondary | ICD-10-CM | POA: Diagnosis not present

## 2018-10-31 NOTE — Progress Notes (Signed)
Subjective:   Patient ID: [name], male   DOB: 78 y.o.   MRN: [id number]   HPI Patient presents stating his heel has been feeling quite a bit better and his toenails need to be taken care of and they do get sore as they grow too long and thick    ROS      Objective:  Physical Exam  Neurovascular status intact muscle strength noted to be adequate with discomfort plantar heel only upon deep palpation with significant improvement with thick yellow brittle nailbeds 1-5 both feet that were painful when palpated     Assessment:  Plantar fasciitis improved but present left with mycotic nail infection with pain 1-5 both feet     Plan:  H&P discussed fasciitis and recommended continue supportive shoes elevated heels and not going barefoot and debrided nailbeds 1-5 both feet with no iatrogenic bleeding and return in 3 months for routine care or earlier if the fasciitis should intensify

## 2018-11-09 ENCOUNTER — Other Ambulatory Visit: Payer: Self-pay | Admitting: Cardiovascular Disease

## 2018-12-11 ENCOUNTER — Telehealth: Payer: Self-pay | Admitting: Nurse Practitioner

## 2018-12-11 NOTE — Telephone Encounter (Signed)
Left message for patient to call back regarding appointment on 7/16. Advised that this is a virtual office day for Dr. Acie Fredrickson and for patient to call back to reschedule to clinic day or to keep appointment and change to virtual visit

## 2018-12-11 NOTE — Telephone Encounter (Signed)
Patient's appointment has been rescheduled for Sept 9

## 2018-12-20 ENCOUNTER — Ambulatory Visit: Payer: Medicare Other | Admitting: Cardiovascular Disease

## 2019-01-09 DIAGNOSIS — I2510 Atherosclerotic heart disease of native coronary artery without angina pectoris: Secondary | ICD-10-CM | POA: Diagnosis not present

## 2019-01-09 DIAGNOSIS — E1121 Type 2 diabetes mellitus with diabetic nephropathy: Secondary | ICD-10-CM | POA: Diagnosis not present

## 2019-01-09 DIAGNOSIS — I10 Essential (primary) hypertension: Secondary | ICD-10-CM | POA: Diagnosis not present

## 2019-01-09 DIAGNOSIS — E1122 Type 2 diabetes mellitus with diabetic chronic kidney disease: Secondary | ICD-10-CM | POA: Diagnosis not present

## 2019-01-16 DIAGNOSIS — F339 Major depressive disorder, recurrent, unspecified: Secondary | ICD-10-CM | POA: Diagnosis not present

## 2019-01-16 DIAGNOSIS — Z125 Encounter for screening for malignant neoplasm of prostate: Secondary | ICD-10-CM | POA: Diagnosis not present

## 2019-01-16 DIAGNOSIS — R399 Unspecified symptoms and signs involving the genitourinary system: Secondary | ICD-10-CM | POA: Diagnosis not present

## 2019-01-16 DIAGNOSIS — N182 Chronic kidney disease, stage 2 (mild): Secondary | ICD-10-CM | POA: Diagnosis not present

## 2019-01-16 DIAGNOSIS — E1122 Type 2 diabetes mellitus with diabetic chronic kidney disease: Secondary | ICD-10-CM | POA: Diagnosis not present

## 2019-01-16 DIAGNOSIS — I2510 Atherosclerotic heart disease of native coronary artery without angina pectoris: Secondary | ICD-10-CM | POA: Diagnosis not present

## 2019-01-16 DIAGNOSIS — E1121 Type 2 diabetes mellitus with diabetic nephropathy: Secondary | ICD-10-CM | POA: Diagnosis not present

## 2019-01-16 DIAGNOSIS — E785 Hyperlipidemia, unspecified: Secondary | ICD-10-CM | POA: Diagnosis not present

## 2019-01-16 DIAGNOSIS — I129 Hypertensive chronic kidney disease with stage 1 through stage 4 chronic kidney disease, or unspecified chronic kidney disease: Secondary | ICD-10-CM | POA: Diagnosis not present

## 2019-01-16 DIAGNOSIS — Z7189 Other specified counseling: Secondary | ICD-10-CM | POA: Diagnosis not present

## 2019-01-24 DIAGNOSIS — Z23 Encounter for immunization: Secondary | ICD-10-CM | POA: Diagnosis not present

## 2019-02-06 ENCOUNTER — Ambulatory Visit: Payer: Medicare Other | Admitting: Podiatry

## 2019-02-08 ENCOUNTER — Telehealth: Payer: Self-pay

## 2019-02-08 NOTE — Telephone Encounter (Signed)

## 2019-02-13 ENCOUNTER — Encounter: Payer: Medicare Other | Admitting: Cardiovascular Disease

## 2019-02-13 ENCOUNTER — Other Ambulatory Visit: Payer: Self-pay

## 2019-02-13 NOTE — Progress Notes (Signed)
Error   This encounter was created in error - please disregard. 

## 2019-02-14 ENCOUNTER — Other Ambulatory Visit: Payer: Self-pay

## 2019-02-14 ENCOUNTER — Encounter: Payer: Self-pay | Admitting: Cardiovascular Disease

## 2019-02-14 ENCOUNTER — Ambulatory Visit (INDEPENDENT_AMBULATORY_CARE_PROVIDER_SITE_OTHER): Payer: Medicare Other | Admitting: Cardiovascular Disease

## 2019-02-14 ENCOUNTER — Encounter

## 2019-02-14 VITALS — BP 126/78 | HR 66 | Ht 69.0 in | Wt 224.1 lb

## 2019-02-14 DIAGNOSIS — I2510 Atherosclerotic heart disease of native coronary artery without angina pectoris: Secondary | ICD-10-CM | POA: Diagnosis not present

## 2019-02-14 DIAGNOSIS — E785 Hyperlipidemia, unspecified: Secondary | ICD-10-CM

## 2019-02-14 DIAGNOSIS — R252 Cramp and spasm: Secondary | ICD-10-CM

## 2019-02-14 DIAGNOSIS — I10 Essential (primary) hypertension: Secondary | ICD-10-CM | POA: Diagnosis not present

## 2019-02-14 DIAGNOSIS — I25118 Atherosclerotic heart disease of native coronary artery with other forms of angina pectoris: Secondary | ICD-10-CM

## 2019-02-14 DIAGNOSIS — I25119 Atherosclerotic heart disease of native coronary artery with unspecified angina pectoris: Secondary | ICD-10-CM | POA: Diagnosis not present

## 2019-02-14 MED ORDER — ISOSORBIDE MONONITRATE ER 60 MG PO TB24: 60.0000 mg | ORAL_TABLET | Freq: Every day | ORAL | 3 refills | Status: DC

## 2019-02-14 NOTE — Patient Instructions (Signed)
Medication Instructions:  Your physician has recommended you make the following change in your medication:  INCREASE IMDUR TO 60 MG DAILY  If you need a refill on your cardiac medications before your next appointment, please call your pharmacy.   Lab work: [employer], Hepatic Panel, Lipid Profile prior to follow-up visit in 2-3 months If you have labs (blood work) drawn today and your tests are completely normal, you will receive your results only by: Marland Kitchen MyChart Message (if you have MyChart) OR . A paper copy in the mail If you have any lab test that is abnormal or we need to change your treatment, we will call you to review the results.  Testing/Procedures: Your physician has requested that you have a lexiscan myoview. For further information please visit [email or web address]. Please follow instruction sheet, as given.\  Your physician has requested that you have a lower extremity arterial duplex. This test is an ultrasound of the arteries in the legs or arms. It looks at arterial blood flow in the legs and arms. Allow one hour for Lower and Upper Arterial scans. There are no restrictions or special instructions   Follow-Up: At [facility], you and your health needs are our priority.  As part of our continuing mission to provide you with exceptional heart care, we have created designated Provider Care Teams.  These Care Teams include your primary Cardiologist (physician) and Advanced Practice Providers (APPs -  Physician Assistants and Nurse Practitioners) who all work together to provide you with the care you need, when you need it. You will need a follow up appointment in:  2-3  months.  Please call our office 2 months in advance to schedule this appointment.  You may see [name], MD or one of the following Advanced Practice Providers on your designated Care Team: [name], PA-C [place], [place] . Daune Perch, NP  Any Other Special Instructions Will Be Listed Below  (If Applicable).    [facility] [address] [place], [place] [address] Phone: [phone]  February 14, 2019    KIMON CORDER DOB: 01/20/1941 MRN: [id number] [address] [place] [address]   Dear Mr. Belew,  You are scheduled for a Myocardial Perfusion Imaging Study on: __ at __.  Please arrive 15 minutes prior to your appointment time for registration and insurance purposes.  The test will take approximately 3 to 4 hours to complete; you may bring reading material.  If someone comes with you to your appointment, they will need to remain in the main lobby due to limited space in the testing area. **If you are pregnant or breastfeeding, please notify the nuclear lab prior to your appointment**  How to prepare for your Myocardial Perfusion Test: . Do not eat or drink 3 hours prior to your test, except you may have water. . Do not consume products containing caffeine (regular or decaffeinated) 12 hours prior to your test. (ex: coffee, chocolate, sodas, tea). . Do bring a list of your current medications with you.  If not listed below, you may take your medications as normal. . Do wear comfortable clothes (no dresses or overalls) and walking shoes, tennis shoes preferred (No heels or open toe shoes are allowed). . Do NOT wear cologne, perfume, aftershave, or lotions (deodorant is allowed). . If these instructions are not followed, your test will have to be rescheduled.  Please report to [address] for your test.  If you have questions or concerns about your appointment, you can call the Nuclear Lab at [phone].  If you cannot keep your appointment, please provide 24 hours notification to the Nuclear Lab, to avoid a possible $50 charge to your account.

## 2019-02-14 NOTE — Progress Notes (Signed)
Cardiology Office Note:    Date:  02/14/2019   ID:  [name], DOB 1941/01/14, MRN [id number]  PCP:  [name], MD  Cardiologist:  [name], MD    Referring MD: [name], MD   Chief Complaint  Patient presents with  . Coronary Artery Disease        [name] is a 78 y.o. male with a hx of Coronary artery disease been years ago. He is a previous patient of Dr. Bing Quarry. He presents now with symptoms of exertional chest pain and shortness breath.  He has shortness of breath at night or during the day  Walking 5-6 minutes causes significant dyspnea.  Also has claudication in his legs with walking   Has occasional chest pain  - usually when he is at rest. Has chest squeezing. - occurs randomly  Has lots of indigestion - occurs randomly   Also has lots of sweating - not necessarliy related to his chest tightness  does not feel similar to his previous MI pain .   No CP since that time   Hx of MI in 1996 -  Had stent by [name] Stopped smoking at that time   Does not exercise regularly    .   Works out in the yard.   Sept. 5, 2018: Demontra is seen today for follow up visit.   S/p recent stenting of his prox - mid LAD  Hx of CAD , stenting by [name] in the past .   PTs BP has been low Has been on a diet and has been cutting back on his protein and his food in general .  Has lost 8.5 lbs   Had an episode of CP at night.   Better after NTG . Still fairly active .   Walks a little ,  Just started a walking program .   Walks 1/2 mile a day without CP  His medical doctor had recently started him on a BP med and he has stopped it now (does not remember the name of it)   Echo Aug. 28 shows normal LV function   August 18, 2017: [name] is seen today for follow-up of his coronary artery disease. He has a history of coronary artery disease and is status post stent placement to the left anterior descending artery in Feb 25, 2017 Overall he is doing  very well.  Is not had any episodes of angina since his PCI in 2017/02/25.  He does have some shortness of breath with exertion.  He is exercising fairly regularly.  Wife is had problems and he has had to help her.  He is lost some weight since his angioplasty.    His last lipid level shows a triglyceride level of 229.  His total cholesterol is 158.  HDL is 39.  LDL 73.  Jan. 6, 2020  [name] is seen today for follow-up of his coronary artery disease.  He status post stent placement to the left anterior descending artery in 02/25/2017.  Wife passed away several months .  Pneumonia and sepsis. Had been married 73 years.    Has not had any angina      June 19, 2018  [name] was seen last week for follow-up of his coronary artery disease.  Several days later he was seen in the [facility] emergency room with a GI bleed. Is now on Protonox and Plavix is on hold until Jan. 27.   Stent placent was 25-Feb-2017.  ECG revealed 2 ulcers - 1 large and 1 small  Seems to be getting better  Still has DOE   Sept. 10, 2020 :  [name] is seen today  Hx of CAD and GI bleeding  Has had tightness in his chest  Has chest tightness, pressure with exertion. Lasted 1 hour- took a SL NTG which helped  Occurs with rest and exertion  Radiates to left shoulder , burning shoulder pain  NTG helps  Has been going on for several months . Not associated with syncope or near syncope  Has taken 2 SL  NTG over the past several weeks.   Has severe leg pain  Has severe leg weakness with walking  Will get lower extremity arterial evaluation ( ABI or duplex scan)  Does not get any regular exercise  Has been eating more take out than usual     Past Medical History:  Diagnosis Date  . Adenomatous colon polyp   . Arthritis   . CAD (coronary artery disease)    a. MI in 1996 with stent placement b. cath 01/18/17 - S/p PCI of in-stent restenosis of pLAD with cutting ballon & DES; 20% ostial LAD; 40% pro Cx; 60%  focal pRCA  . Clostridium difficile infection   . Depression   . Diverticulosis   . H. pylori infection   . HTN (hypertension)   . Hyperlipidemia   . MI (myocardial infarction) [facility])     Past Surgical History:  Procedure Laterality Date  . BRAIN TUMOR EXCISION  1954   benign tumor in back of head, done at  [facility]  . CARDIAC CATHETERIZATION    . COLONOSCOPY     hx of polyps  . [place]; 1997; 01/18/2017  . CORONARY STENT INTERVENTION N/A 01/18/2017   Procedure: CORONARY STENT INTERVENTION;  Surgeon: Troy Sine, MD;  Location: [place] [facility];  Service: Cardiovascular;  Laterality: N/A;  . ESOPHAGOGASTRODUODENOSCOPY (EGD) WITH PROPOFOL N/A 06/14/2018   Procedure: ESOPHAGOGASTRODUODENOSCOPY (EGD) WITH PROPOFOL;  Surgeon: Gatha Mayer, MD;  Location: [facility];  Service: Endoscopy;  Laterality: N/A;  . JOINT REPLACEMENT    . LEFT HEART CATH AND CORONARY ANGIOGRAPHY N/A 01/18/2017   Procedure: LEFT HEART CATH AND CORONARY ANGIOGRAPHY;  Surgeon: Troy Sine, MD;  Location: [place] [facility];  Service: Cardiovascular;  Laterality: N/A;  . stent x2 in the LAD  with intra-aortic balloon pump support  1996  . TONSILLECTOMY    . TOTAL KNEE ARTHROPLASTY Bilateral     Current Medications: Current Meds  Medication Sig  . atorvastatin (LIPITOR) 80 MG tablet Take 80 mg by mouth daily.    . clopidogrel (PLAVIX) 75 MG tablet Take 1 tablet (75 mg total) by mouth daily.  Marland Kitchen escitalopram (LEXAPRO) 20 MG tablet Take 20 mg by mouth daily.  Marland Kitchen esomeprazole (NEXIUM) 40 MG capsule Take 40 mg by mouth daily at 12 noon.  . ezetimibe (ZETIA) 10 MG tablet TAKE 1 TABLET BY MOUTH EVERY DAY  . fenofibrate (TRICOR) 145 MG tablet TAKE 1 TABLET BY MOUTH EVERY DAY  . furosemide (LASIX) 20 MG tablet Take 20 mg by mouth daily as needed for fluid or edema.  Marland Kitchen HYDROcodone-acetaminophen (NORCO) 7.5-325 MG tablet Take 1 tablet by mouth daily as needed. For pain.  .  meloxicam (MOBIC) 7.5 MG tablet Take 7.5 mg by mouth daily.  . nitroGLYCERIN (NITROSTAT) 0.4 MG SL tablet Place 1 tablet (0.4 mg total) under the tongue every  5 (five) minutes as needed for chest pain.  . sodium-potassium bicarbonate (ALKA-SELTZER GOLD) TBEF dissolvable tablet Take 1 tablet by mouth as needed (acid reflux symptoms).  Marland Kitchen tiZANidine (ZANAFLEX) 4 MG tablet Take 1 tablet by mouth every 8 (eight) hours as needed for muscle spasms.   . [DISCONTINUED] isosorbide mononitrate (IMDUR) 30 MG 24 hr tablet Take 1 tablet (30 mg total) by mouth daily.     Allergies:   Patient has no known allergies.   Social History   Socioeconomic History  . Marital status: Married    Spouse name: Not on file  . Number of children: 1  . Years of education: Not on file  . Highest education level: Not on file  Occupational History  . Occupation: Retired  Scientific laboratory technician  . Financial resource strain: Not on file  . Food insecurity    Worry: Not on file    Inability: Not on file  . Transportation needs    Medical: Not on file    Non-medical: Not on file  Tobacco Use  . Smoking status: Former Smoker    Quit date: 06/06/1994    Years since quitting: 24.7  . Smokeless tobacco: Never Used  Substance and Sexual Activity  . Alcohol use: No  . Drug use: No  . Sexual activity: Yes  Lifestyle  . Physical activity    Days per week: Not on file    Minutes per session: Not on file  . Stress: Not on file  Relationships  . Social Herbalist on phone: Not on file    Gets together: Not on file    Attends religious service: Not on file    Active member of club or organization: Not on file    Attends meetings of clubs or organizations: Not on file    Relationship status: Not on file  Other Topics Concern  . Not on file  Social History Narrative  . Not on file     Family History: The patient's family history includes Cancer in his sister; Heart attack in his mother; Heart attack (age of onset:  94) in his brother. There is no history of Colon cancer, Stomach cancer, Rectal cancer, or Esophageal cancer. ROS:   Please see the history of present illness.     All other systems reviewed and are negative.  EKGs/Labs/Other Studies Reviewed:     Physical Exam: Blood pressure 126/78, pulse 66, height 5\' 9"  (1.753 m), weight 224 lb 1.9 oz (101.7 kg), SpO2 94 %.  GEN:  Well nourished, well developed in no acute distress HEENT: Normal NECK: No JVD; No carotid bruits LYMPHATICS: No lymphadenopathy CARDIAC: RRR , no murmurs, rubs, gallops RESPIRATORY:  Clear to auscultation without rales, wheezing or rhonchi  ABDOMEN: Soft, non-tender, non-distended MUSCULOSKELETAL:  No edema; No deformity  SKIN: Warm and dry NEUROLOGIC:  Alert and oriented x 3 3   EKG:      Recent Labs: 04/12/2018: Magnesium 1.9 06/15/2018: Hemoglobin 9.2; Platelets 173 07/26/2018: ALT 12; BUN 25; Creatinine, Ser 1.30; Potassium 4.2; Sodium 142  Recent Lipid Panel    Component Value Date/Time   CHOL 148 07/26/2018 1046   TRIG 83 07/26/2018 1046   HDL 47 07/26/2018 1046   CHOLHDL 3.1 07/26/2018 1046   CHOLHDL 4 06/23/2009 1147   VLDL 23.4 06/23/2009 1147   LDLCALC 84 07/26/2018 1046   LDLDIRECT 159.1 12/10/2007 0851      ASSESSMENT: PLAN:     1. Coronary artery disease:  Has been having more cp recently .  May be angina.   Will increase Indur to 60 mg a day.   Schedule for [place]  Office visit in 2-3 months     2.  Chronic diastolic congestive heart failure.   His breathing seems to be well controlled.   3.  Hyperlipidemia:  Check lipds in 2-3 months   4.  Possible claudication:   Has leg pain and weakness.   Unclear if this is due to PAD.   Will get lower ext arterial duplex or ABI.          Medication Adjustments/Labs and Tests Ordered: Current medicines are reviewed at length with the patient today.  Concerns regarding medicines are outlined above.  Orders Placed This  Encounter  Procedures  . Hepatic function panel  . Basic metabolic panel  . Lipid panel  . MYOCARDIAL PERFUSION IMAGING  . VAS [place] LOWER EXTREMITY ARTERIAL DUPLEX    Meds ordered this encounter  Medications  . isosorbide mononitrate (IMDUR) 60 MG 24 hr tablet    Sig: Take 1 tablet (60 mg total) by mouth daily.    Dispense:  90 tablet    Refill:  3    Signed, [name], MD  02/14/2019 6:28 PM    [place]

## 2019-02-21 ENCOUNTER — Other Ambulatory Visit: Payer: Self-pay | Admitting: Cardiovascular Disease

## 2019-02-21 ENCOUNTER — Ambulatory Visit (HOSPITAL_COMMUNITY)
Admission: RE | Admit: 2019-02-21 | Discharge: 2019-02-21 | Disposition: A | Discharge status: Home / Self Care | Payer: Medicare Other | Source: Ambulatory Visit | Attending: Cardiology | Admitting: Cardiology

## 2019-02-21 ENCOUNTER — Other Ambulatory Visit: Payer: Self-pay

## 2019-02-21 DIAGNOSIS — R252 Cramp and spasm: Secondary | ICD-10-CM

## 2019-02-21 DIAGNOSIS — R200 Anesthesia of skin: Secondary | ICD-10-CM | POA: Insufficient documentation

## 2019-02-21 DIAGNOSIS — R202 Paresthesia of skin: Secondary | ICD-10-CM | POA: Diagnosis not present

## 2019-02-22 ENCOUNTER — Telehealth (HOSPITAL_COMMUNITY): Payer: Self-pay | Admitting: *Deleted

## 2019-02-22 NOTE — Telephone Encounter (Signed)
Patient given detailed instructions per Myocardial Perfusion Study Information Sheet for the test on 02/25/19 at 10:30. Patient notified to arrive 15 minutes early and that it is imperative to arrive on time for appointment to keep from having the test rescheduled.  If you need to cancel or reschedule your appointment, please call the office within 24 hours of your appointment. . Patient verbalized understanding.[name]

## 2019-02-25 ENCOUNTER — Other Ambulatory Visit: Payer: Self-pay

## 2019-02-25 ENCOUNTER — Ambulatory Visit (HOSPITAL_COMMUNITY): Payer: Medicare Other | Attending: Cardiovascular Disease

## 2019-02-25 DIAGNOSIS — I25118 Atherosclerotic heart disease of native coronary artery with other forms of angina pectoris: Secondary | ICD-10-CM

## 2019-02-25 LAB — MYOCARDIAL PERFUSION IMAGING
LV dias vol: 115 mL (ref 62–150)
LV sys vol: 56 mL
Peak HR: 80 {beats}/min
Rest HR: 56 {beats}/min
SDS: 6
SRS: 5
SSS: 12
TID: 0.99

## 2019-02-25 MED ORDER — TECHNETIUM TC 99M TETROFOSMIN IV KIT
32.7000 | PACK | Freq: Once | INTRAVENOUS | Status: AC | PRN
  Administered 2019-02-25: 32.7 via INTRAVENOUS
  Filled 2019-02-25: qty 33

## 2019-02-25 MED ORDER — REGADENOSON 0.4 MG/5ML IV SOLN
0.4000 mg | Freq: Once | INTRAVENOUS | Status: AC
  Administered 2019-02-25: 0.4 mg via INTRAVENOUS

## 2019-02-25 MED ORDER — TECHNETIUM TC 99M TETROFOSMIN IV KIT
10.2000 | PACK | Freq: Once | INTRAVENOUS | Status: AC | PRN
  Administered 2019-02-25: 10.2 via INTRAVENOUS
  Filled 2019-02-25: qty 11

## 2019-03-14 ENCOUNTER — Other Ambulatory Visit: Payer: Self-pay | Admitting: Cardiovascular Disease

## 2019-03-20 ENCOUNTER — Other Ambulatory Visit: Payer: Self-pay

## 2019-03-20 ENCOUNTER — Ambulatory Visit (INDEPENDENT_AMBULATORY_CARE_PROVIDER_SITE_OTHER): Payer: Medicare Other | Admitting: Podiatry

## 2019-03-20 ENCOUNTER — Encounter: Payer: Self-pay | Admitting: Podiatry

## 2019-03-20 DIAGNOSIS — M79676 Pain in unspecified toe(s): Secondary | ICD-10-CM | POA: Diagnosis not present

## 2019-03-20 DIAGNOSIS — B351 Tinea unguium: Secondary | ICD-10-CM

## 2019-03-20 DIAGNOSIS — D689 Coagulation defect, unspecified: Secondary | ICD-10-CM | POA: Insufficient documentation

## 2019-03-20 NOTE — Progress Notes (Signed)
Complaint:  Visit Type: Patient returns to my office for continued preventative foot care services. Complaint: Patient states" my nails have grown long and thick and become painful to walk and wear shoes" Patient has been diagnosed with DM with no foot complications. The patient presents for preventative foot care services. Patient is taking plavix.  Podiatric Exam: Vascular: dorsalis pedis and posterior tibial pulses are palpable bilateral. Capillary return is immediate. Temperature gradient is WNL. Skin turgor WNL  Sensorium: Normal Semmes Weinstein monofilament test. Normal tactile sensation bilaterally. Nail Exam: Pt has thick disfigured discolored nails with subungual debris noted bilateral entire nail hallux through fifth toenails Ulcer Exam: There is no evidence of ulcer or pre-ulcerative changes or infection. Orthopedic Exam: Muscle tone and strength are WNL. No limitations in general ROM. No crepitus or effusions noted. Foot type and digits show no abnormalities. HAV/HL  1st MPJ  B/L. Skin: No Porokeratosis. No infection or ulcers  Diagnosis:  Onychomycosis, , Pain in right toe, pain in left toes  Treatment & Plan Procedures and Treatment: Consent by patient was obtained for treatment procedures.   Debridement of mycotic and hypertrophic toenails, 1 through 5 bilateral and clearing of subungual debris. No ulceration, no infection noted.  Return Visit-Office Procedure: Patient instructed to return to the office for a follow up visit 4 months for continued evaluation and treatment.    Gardiner Barefoot DPM

## 2019-03-28 DIAGNOSIS — H04123 Dry eye syndrome of bilateral lacrimal glands: Secondary | ICD-10-CM | POA: Diagnosis not present

## 2019-03-28 DIAGNOSIS — H31011 Macula scars of posterior pole (postinflammatory) (post-traumatic), right eye: Secondary | ICD-10-CM | POA: Diagnosis not present

## 2019-03-28 DIAGNOSIS — H5500 Unspecified nystagmus: Secondary | ICD-10-CM | POA: Diagnosis not present

## 2019-03-28 DIAGNOSIS — Z961 Presence of intraocular lens: Secondary | ICD-10-CM | POA: Diagnosis not present

## 2019-04-08 DIAGNOSIS — I10 Essential (primary) hypertension: Secondary | ICD-10-CM | POA: Diagnosis not present

## 2019-04-08 DIAGNOSIS — Z961 Presence of intraocular lens: Secondary | ICD-10-CM | POA: Diagnosis not present

## 2019-04-08 DIAGNOSIS — H31091 Other chorioretinal scars, right eye: Secondary | ICD-10-CM | POA: Diagnosis not present

## 2019-04-08 DIAGNOSIS — H5501 Congenital nystagmus: Secondary | ICD-10-CM | POA: Diagnosis not present

## 2019-04-10 DIAGNOSIS — Z0000 Encounter for general adult medical examination without abnormal findings: Secondary | ICD-10-CM | POA: Diagnosis not present

## 2019-04-10 DIAGNOSIS — Z125 Encounter for screening for malignant neoplasm of prostate: Secondary | ICD-10-CM | POA: Diagnosis not present

## 2019-04-10 DIAGNOSIS — I2510 Atherosclerotic heart disease of native coronary artery without angina pectoris: Secondary | ICD-10-CM | POA: Diagnosis not present

## 2019-04-10 DIAGNOSIS — E785 Hyperlipidemia, unspecified: Secondary | ICD-10-CM | POA: Diagnosis not present

## 2019-04-10 DIAGNOSIS — N182 Chronic kidney disease, stage 2 (mild): Secondary | ICD-10-CM | POA: Diagnosis not present

## 2019-04-10 DIAGNOSIS — E1121 Type 2 diabetes mellitus with diabetic nephropathy: Secondary | ICD-10-CM | POA: Diagnosis not present

## 2019-04-10 DIAGNOSIS — E1122 Type 2 diabetes mellitus with diabetic chronic kidney disease: Secondary | ICD-10-CM | POA: Diagnosis not present

## 2019-04-10 DIAGNOSIS — I10 Essential (primary) hypertension: Secondary | ICD-10-CM | POA: Diagnosis not present

## 2019-04-12 DIAGNOSIS — H5500 Unspecified nystagmus: Secondary | ICD-10-CM | POA: Diagnosis not present

## 2019-04-15 DIAGNOSIS — E782 Mixed hyperlipidemia: Secondary | ICD-10-CM | POA: Diagnosis not present

## 2019-04-15 DIAGNOSIS — F17211 Nicotine dependence, cigarettes, in remission: Secondary | ICD-10-CM | POA: Diagnosis not present

## 2019-04-15 DIAGNOSIS — I209 Angina pectoris, unspecified: Secondary | ICD-10-CM | POA: Diagnosis not present

## 2019-04-15 DIAGNOSIS — I2510 Atherosclerotic heart disease of native coronary artery without angina pectoris: Secondary | ICD-10-CM | POA: Diagnosis not present

## 2019-04-15 DIAGNOSIS — E785 Hyperlipidemia, unspecified: Secondary | ICD-10-CM | POA: Diagnosis not present

## 2019-04-15 DIAGNOSIS — F339 Major depressive disorder, recurrent, unspecified: Secondary | ICD-10-CM | POA: Diagnosis not present

## 2019-04-15 DIAGNOSIS — I5043 Acute on chronic combined systolic (congestive) and diastolic (congestive) heart failure: Secondary | ICD-10-CM | POA: Diagnosis not present

## 2019-04-15 DIAGNOSIS — E1122 Type 2 diabetes mellitus with diabetic chronic kidney disease: Secondary | ICD-10-CM | POA: Diagnosis not present

## 2019-04-15 DIAGNOSIS — I129 Hypertensive chronic kidney disease with stage 1 through stage 4 chronic kidney disease, or unspecified chronic kidney disease: Secondary | ICD-10-CM | POA: Diagnosis not present

## 2019-04-15 DIAGNOSIS — Z7189 Other specified counseling: Secondary | ICD-10-CM | POA: Diagnosis not present

## 2019-04-15 DIAGNOSIS — E1121 Type 2 diabetes mellitus with diabetic nephropathy: Secondary | ICD-10-CM | POA: Diagnosis not present

## 2019-04-15 DIAGNOSIS — N182 Chronic kidney disease, stage 2 (mild): Secondary | ICD-10-CM | POA: Diagnosis not present

## 2019-04-18 DIAGNOSIS — J3489 Other specified disorders of nose and nasal sinuses: Secondary | ICD-10-CM | POA: Diagnosis not present

## 2019-04-18 DIAGNOSIS — Z20828 Contact with and (suspected) exposure to other viral communicable diseases: Secondary | ICD-10-CM | POA: Diagnosis not present

## 2019-05-06 ENCOUNTER — Other Ambulatory Visit: Payer: Medicare Other | Admitting: *Deleted

## 2019-05-06 ENCOUNTER — Other Ambulatory Visit: Payer: Self-pay

## 2019-05-06 DIAGNOSIS — I25118 Atherosclerotic heart disease of native coronary artery with other forms of angina pectoris: Secondary | ICD-10-CM | POA: Diagnosis not present

## 2019-05-06 DIAGNOSIS — R252 Cramp and spasm: Secondary | ICD-10-CM | POA: Diagnosis not present

## 2019-05-06 DIAGNOSIS — E785 Hyperlipidemia, unspecified: Secondary | ICD-10-CM

## 2019-05-06 LAB — LIPID PANEL
Chol/HDL Ratio: 2.8 ratio (ref 0.0–5.0)
Cholesterol, Total: 123 mg/dL (ref 100–199)
HDL: 44 mg/dL (ref >39)
LDL Chol Calc (NIH): 63 mg/dL (ref 0–99)
Triglycerides: 84 mg/dL (ref 0–149)
VLDL Cholesterol Cal: 16 mg/dL (ref 5–40)

## 2019-05-06 LAB — HEPATIC FUNCTION PANEL
ALT: 9 IU/L (ref 0–44)
AST: 15 IU/L (ref 0–40)
Albumin: 4.1 g/dL (ref 3.7–4.7)
Alkaline Phosphatase: 54 IU/L (ref 39–117)
Bilirubin Total: 0.6 mg/dL (ref 0.0–1.2)
Bilirubin, Direct: 0.22 mg/dL (ref 0.00–0.40)
Total Protein: 6.3 g/dL (ref 6.0–8.5)

## 2019-05-06 LAB — BASIC METABOLIC PANEL
BUN/Creatinine Ratio: 16 (ref 10–24)
BUN: 22 mg/dL (ref 8–27)
CO2: 25 mmol/L (ref 20–29)
Calcium: 9.1 mg/dL (ref 8.6–10.2)
Chloride: 104 mmol/L (ref 96–106)
Creatinine, Ser: 1.39 mg/dL — ABNORMAL HIGH (ref 0.76–1.27)
GFR calc Af Amer: 57 mL/min/{1.73_m2} — ABNORMAL LOW (ref >59)
GFR calc non Af Amer: 49 mL/min/{1.73_m2} — ABNORMAL LOW (ref >59)
Glucose: 102 mg/dL — ABNORMAL HIGH (ref 65–99)
Potassium: 4.3 mmol/L (ref 3.5–5.2)
Sodium: 140 mmol/L (ref 134–144)

## 2019-05-09 ENCOUNTER — Encounter: Payer: Self-pay | Admitting: Cardiovascular Disease

## 2019-05-09 ENCOUNTER — Other Ambulatory Visit: Payer: Self-pay

## 2019-05-09 ENCOUNTER — Ambulatory Visit (INDEPENDENT_AMBULATORY_CARE_PROVIDER_SITE_OTHER): Payer: Medicare Other | Admitting: Cardiovascular Disease

## 2019-05-09 VITALS — BP 114/70 | HR 63 | Ht 69.0 in | Wt 231.2 lb

## 2019-05-09 DIAGNOSIS — I5022 Chronic systolic (congestive) heart failure: Secondary | ICD-10-CM

## 2019-05-09 DIAGNOSIS — I10 Essential (primary) hypertension: Secondary | ICD-10-CM | POA: Diagnosis not present

## 2019-05-09 DIAGNOSIS — I25119 Atherosclerotic heart disease of native coronary artery with unspecified angina pectoris: Secondary | ICD-10-CM | POA: Diagnosis not present

## 2019-05-09 DIAGNOSIS — I2510 Atherosclerotic heart disease of native coronary artery without angina pectoris: Secondary | ICD-10-CM | POA: Diagnosis not present

## 2019-05-09 NOTE — Patient Instructions (Signed)
Medication Instructions:  Your physician recommends that you continue on your current medications as directed. Please refer to the Current Medication list given to you today.  *If you need a refill on your cardiac medications before your next appointment, please call your pharmacy*   Lab Work: None Ordered   Testing/Procedures: None Ordered   Follow-Up: At [facility], you and your health needs are our priority.  As part of our continuing mission to provide you with exceptional heart care, we have created designated Provider Care Teams.  These Care Teams include your primary Cardiologist (physician) and Advanced Practice Providers (APPs -  Physician Assistants and Nurse Practitioners) who all work together to provide you with the care you need, when you need it.  Your next appointment:   6 month(s)  The format for your next appointment:   Either In Person or Virtual  Provider:   [name], PA-C, Robbie Lis, PA-C or Daune Perch, NP

## 2019-05-09 NOTE — Progress Notes (Signed)
Cardiology Office Note:    Date:  05/09/2019   ID:  [name], DOB [birth year], MRN [id number]  PCP:  [name], MD  Cardiologist:  [name], MD    Referring MD: [name], MD   Chief Complaint  Patient presents with  . Coronary Artery Disease        [name] is a 78 y.o. male with a hx of Coronary artery disease been years ago. He is a previous patient of Dr. Bing Quarry. He presents now with symptoms of exertional chest pain and shortness breath.  He has shortness of breath at night or during the day  Walking 5-6 minutes causes significant dyspnea.  Also has claudication in his legs with walking   Has occasional chest pain  - usually when he is at rest. Has chest squeezing. - occurs randomly  Has lots of indigestion - occurs randomly   Also has lots of sweating - not necessarliy related to his chest tightness  does not feel similar to his previous MI pain .   No CP since that time   Hx of MI in 1996 -  Had stent by [name] Stopped smoking at that time   Does not exercise regularly    .   Works out in the yard.   Sept. 5, 2018: [name] is seen today for follow up visit.   S/p recent stenting of his prox - mid LAD  Hx of CAD , stenting by [name] in the past .   PTs BP has been low Has been on a diet and has been cutting back on his protein and his food in general .  Has lost 8.5 lbs   Had an episode of CP at night.   Better after NTG . Still fairly active .   Walks a little ,  Just started a walking program .   Walks 1/2 mile a day without CP  His medical doctor had recently started him on a BP med and he has stopped it now (does not remember the name of it)   Echo Aug. 28 shows normal LV function   August 18, 2017: [name] is seen today for follow-up of his coronary artery disease. He has a history of coronary artery disease and is status post stent placement to the left anterior descending artery in 04-Feb-2017 Overall he is doing  very well.  Is not had any episodes of angina since his PCI in 02/04/17.  He does have some shortness of breath with exertion.  He is exercising fairly regularly.  Wife is had problems and he has had to help her.  He is lost some weight since his angioplasty.    His last lipid level shows a triglyceride level of 229.  His total cholesterol is 158.  HDL is 39.  LDL 73.  Jan. 6, 2020  [name] is seen today for follow-up of his coronary artery disease.  He status post stent placement to the left anterior descending artery in 2017/02/04.  Wife passed away several months .  Pneumonia and sepsis. Had been married 55 years.    Has not had any angina      June 19, 2018  [name] was seen last week for follow-up of his coronary artery disease.  Several days later he was seen in the [place] long emergency room with a GI bleed. Is now on Protonox and Plavix is on hold until Jan. 27.   Stent placent was 2017/02/04.  ECG revealed 2 ulcers - 1 large and 1 small  Seems to be getting better  Still has DOE   Sept. 10, 2020 :  [name] is seen today  Hx of CAD and GI bleeding  Has had tightness in his chest  Has chest tightness, pressure with exertion. Lasted 1 hour- took a SL NTG which helped  Occurs with rest and exertion  Radiates to left shoulder , burning shoulder pain  NTG helps  Has been going on for several months . Not associated with syncope or near syncope  Has taken 2 SL  NTG over the past several weeks.   Has severe leg pain  Has severe leg weakness with walking  Will get lower extremity arterial evaluation ( ABI or duplex scan)  Does not get any regular exercise  Has been eating more take out than usual   May 09, 2019:  [name] is seen today for a follow-up visit for his coronary artery disease  He has a history of claudication-like symptoms but lower extremity arterial duplex scan was normal. He has a history of hypertension hyperlipidemia.  He has a past history of  smoking.  Has a hx of GI bleeding .   Seems to be improving. Has a history of coronary artery disease.  Stress Myoview study from September, 2020 reveals an old anterior wall myocardial infarction but no ischemia.  His ejection fraction was 51% at that time.  Recent labs from May 06, 2019 reveals a total cholesterol of 123.  The HDL is 44.  LDL is 63.  The triglyceride level is 84.  Glucose is 102.  Sodium and potassium are normal.  Creatinine is 1.39.  Liver enzymes are  normal.  Past Medical History:  Diagnosis Date  . Adenomatous colon polyp   . Arthritis   . CAD (coronary artery disease)    a. MI in 1996 with stent placement b. cath 01/18/17 - S/p PCI of in-stent restenosis of pLAD with cutting ballon & DES; 20% ostial LAD; 40% pro Cx; 60% focal pRCA  . Clostridium difficile infection   . Depression   . Diverticulosis   . H. pylori infection   . HTN (hypertension)   . Hyperlipidemia   . [facility] (myocardial infarction) [facility]     Past Surgical History:  Procedure Laterality Date  . BRAIN TUMOR EXCISION  1954   benign tumor in back of head, done at  [facility]  . CARDIAC CATHETERIZATION    . COLONOSCOPY     hx of polyps  . [place]; 1997; 01/18/2017  . CORONARY STENT INTERVENTION N/A 01/18/2017   Procedure: CORONARY STENT INTERVENTION;  Surgeon: Troy Sine, MD;  Location: [facility];  Service: Cardiovascular;  Laterality: N/A;  . ESOPHAGOGASTRODUODENOSCOPY (EGD) WITH PROPOFOL N/A 06/14/2018   Procedure: ESOPHAGOGASTRODUODENOSCOPY (EGD) WITH PROPOFOL;  Surgeon: Gatha Mayer, MD;  Location: [facility];  Service: Endoscopy;  Laterality: N/A;  . JOINT REPLACEMENT    . LEFT HEART CATH AND CORONARY ANGIOGRAPHY N/A 01/18/2017   Procedure: LEFT HEART CATH AND CORONARY ANGIOGRAPHY;  Surgeon: Troy Sine, MD;  Location: [place] [facility];  Service: Cardiovascular;  Laterality: N/A;  . stent x2 in the LAD  with intra-aortic balloon pump  support  1996  . TONSILLECTOMY    . TOTAL KNEE ARTHROPLASTY Bilateral     Current Medications: Current Meds  Medication Sig  . atorvastatin (LIPITOR) 80 MG tablet Take 80 mg by mouth daily.    Marland Kitchen  clopidogrel (PLAVIX) 75 MG tablet Take 1 tablet (75 mg total) by mouth daily.  Marland Kitchen escitalopram (LEXAPRO) 20 MG tablet Take 20 mg by mouth daily.  Marland Kitchen esomeprazole (NEXIUM) 40 MG capsule Take 40 mg by mouth daily at 12 noon.  . ezetimibe (ZETIA) 10 MG tablet TAKE 1 TABLET BY MOUTH EVERY DAY  . fenofibrate (TRICOR) 145 MG tablet TAKE 1 TABLET BY MOUTH EVERY DAY  . furosemide (LASIX) 20 MG tablet Take 20 mg by mouth daily as needed for fluid or edema.  . gabapentin (NEURONTIN) 300 MG capsule Take 300 mg by mouth daily.  Marland Kitchen HYDROcodone-acetaminophen (NORCO) 7.5-325 MG tablet Take 1 tablet by mouth daily as needed. For pain.  . isosorbide mononitrate (IMDUR) 60 MG 24 hr tablet Take 1 tablet (60 mg total) by mouth daily.  . meloxicam (MOBIC) 7.5 MG tablet Take 7.5 mg by mouth daily.  . metoprolol succinate (TOPROL-XL) 50 MG 24 hr tablet Take 50 mg by mouth daily.  . nitroGLYCERIN (NITROSTAT) 0.4 MG SL tablet Place 1 tablet (0.4 mg total) under the tongue every 5 (five) minutes as needed for chest pain.  . sodium-potassium bicarbonate (ALKA-SELTZER GOLD) TBEF dissolvable tablet Take 1 tablet by mouth as needed (acid reflux symptoms).  Marland Kitchen tiZANidine (ZANAFLEX) 4 MG tablet Take 1 tablet by mouth every 8 (eight) hours as needed for muscle spasms.      Allergies:   Patient has no known allergies.   Social History   Socioeconomic History  . Marital status: Married    Spouse name: Not on file  . Number of children: 1  . Years of education: Not on file  . Highest education level: Not on file  Occupational History  . Occupation: Retired  Scientific laboratory technician  . Financial resource strain: Not on file  . Food insecurity    Worry: Not on file    Inability: Not on file  . Transportation needs    Medical: Not on  file    Non-medical: Not on file  Tobacco Use  . Smoking status: Former Smoker    Quit date: 06/06/1994    Years since quitting: 24.9  . Smokeless tobacco: Never Used  Substance and Sexual Activity  . Alcohol use: No  . Drug use: No  . Sexual activity: Yes  Lifestyle  . Physical activity    Days per week: Not on file    Minutes per session: Not on file  . Stress: Not on file  Relationships  . Social Herbalist on phone: Not on file    Gets together: Not on file    Attends religious service: Not on file    Active member of club or organization: Not on file    Attends meetings of clubs or organizations: Not on file    Relationship status: Not on file  Other Topics Concern  . Not on file  Social History Narrative  . Not on file     Family History: The patient's family history includes Cancer in his sister; Heart attack in his mother; Heart attack (age of onset: 30) in his brother. There is no history of Colon cancer, Stomach cancer, Rectal cancer, or Esophageal cancer. ROS:   Please see the history of present illness.     All other systems reviewed and are negative.  EKGs/Labs/Other Studies Reviewed:     Physical Exam: Blood pressure 114/70, pulse 63, height 5\' 9"  (1.753 m), weight 231 lb 3.2 oz (104.9 kg), SpO2 98 %. Body  mass index is 34.14 kg/m.  GEN:  Moderately obese , well developed in no acute distress HEENT: Normal NECK: No JVD; No carotid bruits LYMPHATICS: No lymphadenopathy CARDIAC: RRR , no murmurs, rubs, gallops RESPIRATORY:  Clear to auscultation without rales, wheezing or rhonchi  ABDOMEN: Soft, non-tender, non-distended MUSCULOSKELETAL:  No edema; No deformity  SKIN: Warm and dry NEUROLOGIC:  Alert and oriented x 3   EKG:   May 09, 2019: Normal sinus rhythm at 63 beats a minute.  Left anterior fascicular block.  Prior anterior wall myocardial infarction.   Recent Labs: 06/15/2018: Hemoglobin 9.2; Platelets 173 05/06/2019: ALT 9; BUN  22; Creatinine, Ser 1.39; Potassium 4.3; Sodium 140  Recent Lipid Panel    Component Value Date/Time   CHOL 123 05/06/2019 1008   TRIG 84 05/06/2019 1008   HDL 44 05/06/2019 1008   CHOLHDL 2.8 05/06/2019 1008   CHOLHDL 4 06/23/2009 1147   VLDL 23.4 06/23/2009 1147   LDLCALC 63 05/06/2019 1008   LDLDIRECT 159.1 12/10/2007 0851      ASSESSMENT: PLAN:     1. Coronary artery disease:   [name] is doing fairly well.  He is not having any further episodes of chest pain.  He think that some of his symptoms may have been anxiety and depression surrounding his wife's passing.  We performed a stress Myoview study which was negative for ischemia.  He does have a history of anterior wall myocardial infarction.    2.  Chronic diastolic congestive heart failure.   His breathing seems to be well controlled.   3.  Hyperlipidemia: His lipid levels from several days ago look great.  Continue current medications.  4.  Possible claudication: Lower extremity arterial duplex scan revealed no evidence of PAD.   Medication Adjustments/Labs and Tests Ordered: Current medicines are reviewed at length with the patient today.  Concerns regarding medicines are outlined above.  Orders Placed This Encounter  Procedures  . EKG 12-Lead    No orders of the defined types were placed in this encounter.   Signed, [name], MD  05/09/2019 5:55 PM    [place]

## 2019-05-27 ENCOUNTER — Ambulatory Visit
Admission: RE | Admit: 2019-05-27 | Discharge: 2019-05-27 | Disposition: A | Discharge status: Home / Self Care | Payer: Medicare Other | Source: Ambulatory Visit | Attending: Surgery | Admitting: Surgery

## 2019-05-27 ENCOUNTER — Other Ambulatory Visit: Payer: Self-pay

## 2019-05-27 ENCOUNTER — Other Ambulatory Visit: Payer: Self-pay | Admitting: Surgery

## 2019-05-27 DIAGNOSIS — H5500 Unspecified nystagmus: Secondary | ICD-10-CM

## 2019-05-27 MED ORDER — GADOBENATE DIMEGLUMINE 529 MG/ML IV SOLN
20.0000 mL | Freq: Once | INTRAVENOUS | Status: AC | PRN
  Administered 2019-05-27: 20 mL via INTRAVENOUS

## 2019-06-24 DIAGNOSIS — R351 Nocturia: Secondary | ICD-10-CM | POA: Diagnosis not present

## 2019-06-24 DIAGNOSIS — N471 Phimosis: Secondary | ICD-10-CM | POA: Diagnosis not present

## 2019-06-24 DIAGNOSIS — N3943 Post-void dribbling: Secondary | ICD-10-CM | POA: Diagnosis not present

## 2019-06-26 ENCOUNTER — Ambulatory Visit (INDEPENDENT_AMBULATORY_CARE_PROVIDER_SITE_OTHER): Payer: Medicare Other | Admitting: Podiatry

## 2019-06-26 ENCOUNTER — Encounter: Payer: Self-pay | Admitting: Podiatry

## 2019-06-26 ENCOUNTER — Other Ambulatory Visit: Payer: Self-pay

## 2019-06-26 DIAGNOSIS — M79609 Pain in unspecified limb: Secondary | ICD-10-CM

## 2019-06-26 DIAGNOSIS — D689 Coagulation defect, unspecified: Secondary | ICD-10-CM

## 2019-06-26 DIAGNOSIS — M79676 Pain in unspecified toe(s): Secondary | ICD-10-CM | POA: Diagnosis not present

## 2019-06-26 DIAGNOSIS — B351 Tinea unguium: Secondary | ICD-10-CM | POA: Diagnosis not present

## 2019-06-26 NOTE — Progress Notes (Signed)
Complaint:  Visit Type: Patient returns to my office for continued preventative foot care services. Complaint: Patient states" my nails have grown long and thick and become painful to walk and wear shoes" Patient has been diagnosed with DM with no foot complications. The patient presents for preventative foot care services. Patient is taking plavix.  Podiatric Exam: Vascular: dorsalis pedis and posterior tibial pulses are palpable bilateral. Capillary return is immediate. Temperature gradient is WNL. Skin turgor WNL  Sensorium: Normal Semmes Weinstein monofilament test. Normal tactile sensation bilaterally. Nail Exam: Pt has thick disfigured discolored nails with subungual debris noted bilateral entire nail hallux through fifth toenails Ulcer Exam: There is no evidence of ulcer or pre-ulcerative changes or infection. Orthopedic Exam: Muscle tone and strength are WNL. No limitations in general ROM. No crepitus or effusions noted. Foot type and digits show no abnormalities. HAV/HL  1st MPJ  B/L. Skin: No Porokeratosis. No infection or ulcers  Diagnosis:  Onychomycosis, , Pain in right toe, pain in left toes  Treatment & Plan Procedures and Treatment: Consent by patient was obtained for treatment procedures.   Debridement of mycotic and hypertrophic toenails, 1 through 5 bilateral and clearing of subungual debris. No ulceration, no infection noted.  Return Visit-Office Procedure: Patient instructed to return to the office for a follow up visit 3 months for continued evaluation and treatment.    Gardiner Barefoot DPM

## 2019-07-01 DIAGNOSIS — Z9889 Other specified postprocedural states: Secondary | ICD-10-CM | POA: Diagnosis not present

## 2019-07-01 DIAGNOSIS — Z961 Presence of intraocular lens: Secondary | ICD-10-CM | POA: Diagnosis not present

## 2019-07-01 DIAGNOSIS — H5509 Other forms of nystagmus: Secondary | ICD-10-CM | POA: Diagnosis not present

## 2019-07-24 ENCOUNTER — Ambulatory Visit: Payer: Medicare Other | Admitting: Podiatry

## 2019-08-02 DIAGNOSIS — H5500 Unspecified nystagmus: Secondary | ICD-10-CM | POA: Diagnosis not present

## 2019-08-02 DIAGNOSIS — H5509 Other forms of nystagmus: Secondary | ICD-10-CM | POA: Diagnosis not present

## 2019-08-12 DIAGNOSIS — I5043 Acute on chronic combined systolic (congestive) and diastolic (congestive) heart failure: Secondary | ICD-10-CM | POA: Diagnosis not present

## 2019-08-12 DIAGNOSIS — I2510 Atherosclerotic heart disease of native coronary artery without angina pectoris: Secondary | ICD-10-CM | POA: Diagnosis not present

## 2019-08-12 DIAGNOSIS — E785 Hyperlipidemia, unspecified: Secondary | ICD-10-CM | POA: Diagnosis not present

## 2019-08-12 DIAGNOSIS — E1121 Type 2 diabetes mellitus with diabetic nephropathy: Secondary | ICD-10-CM | POA: Diagnosis not present

## 2019-08-12 DIAGNOSIS — E1122 Type 2 diabetes mellitus with diabetic chronic kidney disease: Secondary | ICD-10-CM | POA: Diagnosis not present

## 2019-08-28 DIAGNOSIS — E1122 Type 2 diabetes mellitus with diabetic chronic kidney disease: Secondary | ICD-10-CM | POA: Diagnosis not present

## 2019-08-28 DIAGNOSIS — E1121 Type 2 diabetes mellitus with diabetic nephropathy: Secondary | ICD-10-CM | POA: Diagnosis not present

## 2019-08-28 DIAGNOSIS — I2510 Atherosclerotic heart disease of native coronary artery without angina pectoris: Secondary | ICD-10-CM | POA: Diagnosis not present

## 2019-08-28 DIAGNOSIS — F321 Major depressive disorder, single episode, moderate: Secondary | ICD-10-CM | POA: Diagnosis not present

## 2019-08-28 DIAGNOSIS — K219 Gastro-esophageal reflux disease without esophagitis: Secondary | ICD-10-CM | POA: Diagnosis not present

## 2019-08-28 DIAGNOSIS — I5043 Acute on chronic combined systolic (congestive) and diastolic (congestive) heart failure: Secondary | ICD-10-CM | POA: Diagnosis not present

## 2019-09-25 ENCOUNTER — Other Ambulatory Visit: Payer: Self-pay

## 2019-09-25 ENCOUNTER — Ambulatory Visit (INDEPENDENT_AMBULATORY_CARE_PROVIDER_SITE_OTHER): Payer: Medicare Other | Admitting: Podiatry

## 2019-09-25 ENCOUNTER — Encounter: Payer: Self-pay | Admitting: Podiatry

## 2019-09-25 VITALS — Temp 96.3°F

## 2019-09-25 DIAGNOSIS — M79676 Pain in unspecified toe(s): Secondary | ICD-10-CM | POA: Diagnosis not present

## 2019-09-25 DIAGNOSIS — D689 Coagulation defect, unspecified: Secondary | ICD-10-CM | POA: Diagnosis not present

## 2019-09-25 DIAGNOSIS — B351 Tinea unguium: Secondary | ICD-10-CM

## 2019-09-25 NOTE — Progress Notes (Signed)
This patient returns to my office for at risk foot care.  This patient requires this care by a professional since this patient will be at risk due to having coagulation defect.  Patient is taking plavix.  This patient is unable to cut nails himself since the patient cannot reach his nails.These nails are painful walking and wearing shoes.  This patient presents for at risk foot care today.  General Appearance  Alert, conversant and in no acute stress.  Vascular  Dorsalis pedis and posterior tibial  pulses are palpable  bilaterally.  Capillary return is within normal limits  bilaterally. Temperature is within normal limits  bilaterally.  Neurologic  Senn-Weinstein monofilament wire test within normal limits  bilaterally. Muscle power within normal limits bilaterally.  Nails Thick disfigured discolored nails with subungual debris  from hallux to fifth toes bilaterally. No evidence of bacterial infection or drainage bilaterally.  Orthopedic  No limitations of motion  feet .  No crepitus or effusions noted.  No bony pathology or digital deformities noted.  Skin  normotropic skin with no porokeratosis noted bilaterally.  No signs of infections or ulcers noted.     Onychomycosis  Pain in right toes  Pain in left toes  Consent was obtained for treatment procedures.   Mechanical debridement of nails 1-5  bilaterally performed with a nail nipper.  Filed with dremel without incident.    Return office visit    3 months                  Told patient to return for periodic foot care and evaluation due to potential at risk complications.   Jackline Castilla DPM  

## 2019-09-27 DIAGNOSIS — M25511 Pain in right shoulder: Secondary | ICD-10-CM | POA: Diagnosis not present

## 2019-10-06 ENCOUNTER — Other Ambulatory Visit: Payer: Self-pay | Admitting: Cardiovascular Disease

## 2019-10-15 DIAGNOSIS — R252 Cramp and spasm: Secondary | ICD-10-CM | POA: Diagnosis not present

## 2019-10-15 DIAGNOSIS — E782 Mixed hyperlipidemia: Secondary | ICD-10-CM | POA: Diagnosis not present

## 2019-10-15 DIAGNOSIS — I10 Essential (primary) hypertension: Secondary | ICD-10-CM | POA: Diagnosis not present

## 2019-10-15 DIAGNOSIS — I5022 Chronic systolic (congestive) heart failure: Secondary | ICD-10-CM | POA: Diagnosis not present

## 2019-10-15 DIAGNOSIS — E785 Hyperlipidemia, unspecified: Secondary | ICD-10-CM | POA: Diagnosis not present

## 2019-10-15 DIAGNOSIS — E1122 Type 2 diabetes mellitus with diabetic chronic kidney disease: Secondary | ICD-10-CM | POA: Diagnosis not present

## 2019-12-19 DIAGNOSIS — E1122 Type 2 diabetes mellitus with diabetic chronic kidney disease: Secondary | ICD-10-CM | POA: Diagnosis not present

## 2019-12-19 DIAGNOSIS — M545 Low back pain: Secondary | ICD-10-CM | POA: Diagnosis not present

## 2019-12-19 DIAGNOSIS — K219 Gastro-esophageal reflux disease without esophagitis: Secondary | ICD-10-CM | POA: Diagnosis not present

## 2019-12-19 DIAGNOSIS — I5043 Acute on chronic combined systolic (congestive) and diastolic (congestive) heart failure: Secondary | ICD-10-CM | POA: Diagnosis not present

## 2019-12-19 DIAGNOSIS — E1121 Type 2 diabetes mellitus with diabetic nephropathy: Secondary | ICD-10-CM | POA: Diagnosis not present

## 2019-12-19 DIAGNOSIS — I2510 Atherosclerotic heart disease of native coronary artery without angina pectoris: Secondary | ICD-10-CM | POA: Diagnosis not present

## 2019-12-25 DIAGNOSIS — M542 Cervicalgia: Secondary | ICD-10-CM | POA: Diagnosis not present

## 2019-12-25 DIAGNOSIS — I5022 Chronic systolic (congestive) heart failure: Secondary | ICD-10-CM | POA: Diagnosis not present

## 2019-12-25 DIAGNOSIS — I130 Hypertensive heart and chronic kidney disease with heart failure and stage 1 through stage 4 chronic kidney disease, or unspecified chronic kidney disease: Secondary | ICD-10-CM | POA: Diagnosis not present

## 2019-12-25 DIAGNOSIS — N182 Chronic kidney disease, stage 2 (mild): Secondary | ICD-10-CM | POA: Diagnosis not present

## 2019-12-25 DIAGNOSIS — E1122 Type 2 diabetes mellitus with diabetic chronic kidney disease: Secondary | ICD-10-CM | POA: Diagnosis not present

## 2019-12-25 DIAGNOSIS — E782 Mixed hyperlipidemia: Secondary | ICD-10-CM | POA: Diagnosis not present

## 2019-12-27 DIAGNOSIS — M5032 Other cervical disc degeneration, mid-cervical region, unspecified level: Secondary | ICD-10-CM | POA: Diagnosis not present

## 2019-12-27 DIAGNOSIS — M9901 Segmental and somatic dysfunction of cervical region: Secondary | ICD-10-CM | POA: Diagnosis not present

## 2019-12-27 DIAGNOSIS — M9902 Segmental and somatic dysfunction of thoracic region: Secondary | ICD-10-CM | POA: Diagnosis not present

## 2019-12-27 DIAGNOSIS — M5134 Other intervertebral disc degeneration, thoracic region: Secondary | ICD-10-CM | POA: Diagnosis not present

## 2019-12-31 ENCOUNTER — Encounter: Payer: Self-pay | Admitting: Podiatry

## 2019-12-31 ENCOUNTER — Ambulatory Visit (INDEPENDENT_AMBULATORY_CARE_PROVIDER_SITE_OTHER): Payer: Medicare Other | Admitting: Podiatry

## 2019-12-31 ENCOUNTER — Other Ambulatory Visit: Payer: Self-pay

## 2019-12-31 DIAGNOSIS — B351 Tinea unguium: Secondary | ICD-10-CM | POA: Diagnosis not present

## 2019-12-31 DIAGNOSIS — M79609 Pain in unspecified limb: Secondary | ICD-10-CM

## 2019-12-31 DIAGNOSIS — D689 Coagulation defect, unspecified: Secondary | ICD-10-CM

## 2019-12-31 NOTE — Progress Notes (Signed)
This patient returns to my office for at risk foot care.  This patient requires this care by a professional since this patient will be at risk due to having coagulation defect.  Patient is taking plavix.  This patient is unable to cut nails himself since the patient cannot reach his nails.These nails are painful walking and wearing shoes.  This patient presents for at risk foot care today.  General Appearance  Alert, conversant and in no acute stress.  Vascular  Dorsalis pedis and posterior tibial  pulses are palpable  bilaterally.  Capillary return is within normal limits  bilaterally. Temperature is within normal limits  bilaterally.  Neurologic  Senn-Weinstein monofilament wire test within normal limits  bilaterally. Muscle power within normal limits bilaterally.  Nails Thick disfigured discolored nails with subungual debris  from hallux to fifth toes bilaterally. No evidence of bacterial infection or drainage bilaterally.  Orthopedic  No limitations of motion  feet .  No crepitus or effusions noted.  No bony pathology or digital deformities noted.  Skin  normotropic skin with no porokeratosis noted bilaterally.  No signs of infections or ulcers noted.     Onychomycosis  Pain in right toes  Pain in left toes  Consent was obtained for treatment procedures.   Mechanical debridement of nails 1-5  bilaterally performed with a nail nipper.  Filed with dremel without incident.    Return office visit    3 months                  Told patient to return for periodic foot care and evaluation due to potential at risk complications.   Jamaal Bernasconi DPM  

## 2020-01-01 DIAGNOSIS — M5134 Other intervertebral disc degeneration, thoracic region: Secondary | ICD-10-CM | POA: Diagnosis not present

## 2020-01-01 DIAGNOSIS — M9902 Segmental and somatic dysfunction of thoracic region: Secondary | ICD-10-CM | POA: Diagnosis not present

## 2020-01-01 DIAGNOSIS — M9901 Segmental and somatic dysfunction of cervical region: Secondary | ICD-10-CM | POA: Diagnosis not present

## 2020-01-01 DIAGNOSIS — M5032 Other cervical disc degeneration, mid-cervical region, unspecified level: Secondary | ICD-10-CM | POA: Diagnosis not present

## 2020-01-03 DIAGNOSIS — M5134 Other intervertebral disc degeneration, thoracic region: Secondary | ICD-10-CM | POA: Diagnosis not present

## 2020-01-03 DIAGNOSIS — M5032 Other cervical disc degeneration, mid-cervical region, unspecified level: Secondary | ICD-10-CM | POA: Diagnosis not present

## 2020-01-03 DIAGNOSIS — M9902 Segmental and somatic dysfunction of thoracic region: Secondary | ICD-10-CM | POA: Diagnosis not present

## 2020-01-03 DIAGNOSIS — M9901 Segmental and somatic dysfunction of cervical region: Secondary | ICD-10-CM | POA: Diagnosis not present

## 2020-01-06 DIAGNOSIS — M9901 Segmental and somatic dysfunction of cervical region: Secondary | ICD-10-CM | POA: Diagnosis not present

## 2020-01-06 DIAGNOSIS — M5032 Other cervical disc degeneration, mid-cervical region, unspecified level: Secondary | ICD-10-CM | POA: Diagnosis not present

## 2020-01-06 DIAGNOSIS — M5134 Other intervertebral disc degeneration, thoracic region: Secondary | ICD-10-CM | POA: Diagnosis not present

## 2020-01-06 DIAGNOSIS — M9902 Segmental and somatic dysfunction of thoracic region: Secondary | ICD-10-CM | POA: Diagnosis not present

## 2020-01-13 DIAGNOSIS — M9901 Segmental and somatic dysfunction of cervical region: Secondary | ICD-10-CM | POA: Diagnosis not present

## 2020-01-13 DIAGNOSIS — M9902 Segmental and somatic dysfunction of thoracic region: Secondary | ICD-10-CM | POA: Diagnosis not present

## 2020-01-13 DIAGNOSIS — M5134 Other intervertebral disc degeneration, thoracic region: Secondary | ICD-10-CM | POA: Diagnosis not present

## 2020-01-13 DIAGNOSIS — M5032 Other cervical disc degeneration, mid-cervical region, unspecified level: Secondary | ICD-10-CM | POA: Diagnosis not present

## 2020-01-16 DIAGNOSIS — M545 Low back pain: Secondary | ICD-10-CM | POA: Diagnosis not present

## 2020-01-20 DIAGNOSIS — M9902 Segmental and somatic dysfunction of thoracic region: Secondary | ICD-10-CM | POA: Diagnosis not present

## 2020-01-20 DIAGNOSIS — M5032 Other cervical disc degeneration, mid-cervical region, unspecified level: Secondary | ICD-10-CM | POA: Diagnosis not present

## 2020-01-20 DIAGNOSIS — M5134 Other intervertebral disc degeneration, thoracic region: Secondary | ICD-10-CM | POA: Diagnosis not present

## 2020-01-20 DIAGNOSIS — M9901 Segmental and somatic dysfunction of cervical region: Secondary | ICD-10-CM | POA: Diagnosis not present

## 2020-01-22 DIAGNOSIS — M5134 Other intervertebral disc degeneration, thoracic region: Secondary | ICD-10-CM | POA: Diagnosis not present

## 2020-01-22 DIAGNOSIS — M9902 Segmental and somatic dysfunction of thoracic region: Secondary | ICD-10-CM | POA: Diagnosis not present

## 2020-01-22 DIAGNOSIS — M9901 Segmental and somatic dysfunction of cervical region: Secondary | ICD-10-CM | POA: Diagnosis not present

## 2020-01-22 DIAGNOSIS — M5032 Other cervical disc degeneration, mid-cervical region, unspecified level: Secondary | ICD-10-CM | POA: Diagnosis not present

## 2020-01-28 ENCOUNTER — Other Ambulatory Visit: Payer: Self-pay | Admitting: Cardiovascular Disease

## 2020-01-28 DIAGNOSIS — M545 Low back pain: Secondary | ICD-10-CM | POA: Diagnosis not present

## 2020-02-04 ENCOUNTER — Other Ambulatory Visit: Payer: Self-pay | Admitting: Orthopedic Surgery

## 2020-02-04 DIAGNOSIS — M545 Low back pain, unspecified: Secondary | ICD-10-CM

## 2020-02-11 ENCOUNTER — Other Ambulatory Visit: Payer: Self-pay | Admitting: Orthopedic Surgery

## 2020-02-11 ENCOUNTER — Other Ambulatory Visit: Payer: Self-pay

## 2020-02-11 ENCOUNTER — Ambulatory Visit
Admission: RE | Admit: 2020-02-11 | Discharge: 2020-02-11 | Disposition: A | Discharge status: Home / Self Care | Payer: Medicare Other | Source: Ambulatory Visit | Attending: Orthopedic Surgery | Admitting: Orthopedic Surgery

## 2020-02-11 DIAGNOSIS — M545 Low back pain, unspecified: Secondary | ICD-10-CM

## 2020-02-11 MED ORDER — IOPAMIDOL (ISOVUE-M 200) INJECTION 41%: 1.0000 mL | Freq: Once | INTRAMUSCULAR | Status: DC

## 2020-02-11 MED ORDER — METHYLPREDNISOLONE ACETATE 40 MG/ML INJ SUSP (RADIOLOG: 120.0000 mg | Freq: Once | INTRAMUSCULAR | Status: DC

## 2020-02-11 NOTE — Discharge Instructions (Signed)

## 2020-02-13 DIAGNOSIS — M4180 Other forms of scoliosis, site unspecified: Secondary | ICD-10-CM | POA: Diagnosis not present

## 2020-02-13 DIAGNOSIS — M5136 Other intervertebral disc degeneration, lumbar region: Secondary | ICD-10-CM | POA: Diagnosis not present

## 2020-02-20 DIAGNOSIS — Z23 Encounter for immunization: Secondary | ICD-10-CM | POA: Diagnosis not present

## 2020-02-28 DIAGNOSIS — M5416 Radiculopathy, lumbar region: Secondary | ICD-10-CM | POA: Diagnosis not present

## 2020-02-28 DIAGNOSIS — M4180 Other forms of scoliosis, site unspecified: Secondary | ICD-10-CM | POA: Diagnosis not present

## 2020-03-30 DIAGNOSIS — Z23 Encounter for immunization: Secondary | ICD-10-CM | POA: Diagnosis not present

## 2020-03-30 DIAGNOSIS — H524 Presbyopia: Secondary | ICD-10-CM | POA: Diagnosis not present

## 2020-03-30 DIAGNOSIS — H5500 Unspecified nystagmus: Secondary | ICD-10-CM | POA: Diagnosis not present

## 2020-04-01 ENCOUNTER — Ambulatory Visit: Payer: Medicare Other | Admitting: Podiatry

## 2020-04-06 DIAGNOSIS — M5451 Vertebrogenic low back pain: Secondary | ICD-10-CM | POA: Diagnosis not present

## 2020-04-06 DIAGNOSIS — M5416 Radiculopathy, lumbar region: Secondary | ICD-10-CM | POA: Diagnosis not present

## 2020-04-14 NOTE — Progress Notes (Signed)
Virtual Visit via Telephone Note   This visit type was conducted due to national recommendations for restrictions regarding the COVID-19 Pandemic (e.g. social distancing) in an effort to limit this patient's exposure and mitigate transmission in our community.  Due to his co-morbid illnesses, this patient is at least at moderate risk for complications without adequate follow up.  This format is felt to be most appropriate for this patient at this time.  The patient did not have access to video technology/had technical difficulties with video requiring transitioning to audio format only (telephone).  All issues noted in this document were discussed and addressed.  No physical exam could be performed with this format.  Please refer to the patient's chart for his  consent to telehealth for [facility].    Date:  04/15/2020   ID:  [name], DOB 12-Jun-1940, MRN [id number] The patient was identified using 2 identifiers.  Patient Location: Home Provider Location: Home Office  PCP:  [name], MD  Cardiologist:  [name], MD   Evaluation Performed:  Follow-Up Visit  Chief Complaint:  12 months follow up  History of Present Illness:    [name] is a 79 y.o. male with hx of CAD s/p stent by Dr. Lia Foyer in [place] to LAD in 01/2017, HLD and chronic diastolic CHF seen for follow up.   Had symptoms concerning for claudication however on evidence of PAD by LE arterial doppler 02/2019.   Last stress test 02/2020 was intermediate risk study but no ischemia. Findings consistent with prior myocardial infarction.  Seen virtually for follow-up.  Denies any complaints.  No chest pain, shortness of breath, orthopnea, PND, syncope, lower extremity edema or melena. Planning start water therapy for R hip pain due to back issue.  Walks about 1 miles around [facility] few days/week. No exertional chest tightness and SOB.    The patient does not have symptoms concerning  for COVID-19 infection (fever, chills, cough, or new shortness of breath).    Past Medical History:  Diagnosis Date  . Adenomatous colon polyp   . Arthritis   . CAD (coronary artery disease)    a. MI in 1996 with stent placement b. cath 01/18/17 - S/p PCI of in-stent restenosis of pLAD with cutting ballon & DES; 20% ostial LAD; 40% pro Cx; 60% focal pRCA  . Clostridium difficile infection   . Depression   . Diverticulosis   . H. pylori infection   . HTN (hypertension)   . Hyperlipidemia   . MI (myocardial infarction) [facility])    Past Surgical History:  Procedure Laterality Date  . BRAIN TUMOR EXCISION  1954   benign tumor in back of head, done at  [facility]  . CARDIAC CATHETERIZATION    . COLONOSCOPY     hx of polyps  . [place]; 1997; 01/18/2017  . CORONARY STENT INTERVENTION N/A 01/18/2017   Procedure: CORONARY STENT INTERVENTION;  Surgeon: Troy Sine, MD;  Location: [facility];  Service: Cardiovascular;  Laterality: N/A;  . ESOPHAGOGASTRODUODENOSCOPY (EGD) WITH PROPOFOL N/A 06/14/2018   Procedure: ESOPHAGOGASTRODUODENOSCOPY (EGD) WITH PROPOFOL;  Surgeon: Gatha Mayer, MD;  Location: [facility];  Service: Endoscopy;  Laterality: N/A;  . JOINT REPLACEMENT    . LEFT HEART CATH AND CORONARY ANGIOGRAPHY N/A 01/18/2017   Procedure: LEFT HEART CATH AND CORONARY ANGIOGRAPHY;  Surgeon: Troy Sine, MD;  Location: [facility];  Service: Cardiovascular;  Laterality: N/A;  .  stent x2 in the LAD  with intra-aortic balloon pump support  1996  . TONSILLECTOMY    . TOTAL KNEE ARTHROPLASTY Bilateral      Current Meds  Medication Sig  . atorvastatin (LIPITOR) 80 MG tablet Take 80 mg by mouth daily.    . clopidogrel (PLAVIX) 75 MG tablet TAKE 1 TABLET BY MOUTH EVERY DAY  . escitalopram (LEXAPRO) 20 MG tablet Take 20 mg by mouth daily.  Marland Kitchen esomeprazole (NEXIUM) 40 MG capsule Take 40 mg by mouth daily at 12 noon.  . ezetimibe (ZETIA) 10 MG  tablet TAKE 1 TABLET BY MOUTH EVERY DAY  . fenofibrate (TRICOR) 145 MG tablet TAKE 1 TABLET BY MOUTH EVERY DAY  . furosemide (LASIX) 20 MG tablet Take 20 mg by mouth daily as needed for fluid or edema.  . gabapentin (NEURONTIN) 300 MG capsule Take 300 mg by mouth daily.  Marland Kitchen HYDROcodone-acetaminophen (NORCO) 7.5-325 MG tablet Take 1 tablet by mouth daily as needed. For pain.  . meloxicam (MOBIC) 7.5 MG tablet Take 7.5 mg by mouth daily.  . metoprolol succinate (TOPROL-XL) 50 MG 24 hr tablet Take 50 mg by mouth daily.  . nitroGLYCERIN (NITROSTAT) 0.4 MG SL tablet Place 1 tablet (0.4 mg total) under the tongue every 5 (five) minutes as needed for chest pain.  Marland Kitchen tiZANidine (ZANAFLEX) 4 MG tablet Take 1 tablet by mouth every 8 (eight) hours as needed for muscle spasms.   . [DISCONTINUED] sodium-potassium bicarbonate (ALKA-SELTZER GOLD) TBEF dissolvable tablet Take 1 tablet by mouth as needed (acid reflux symptoms).  . [DISCONTINUED] tamsulosin (FLOMAX) 0.4 MG CAPS capsule Take 0.4 mg by mouth daily.     Allergies:   Patient has no known allergies.   Social History   Tobacco Use  . Smoking status: Former Smoker    Quit date: 06/06/1994    Years since quitting: 25.8  . Smokeless tobacco: Never Used  Vaping Use  . Vaping Use: Never used  Substance Use Topics  . Alcohol use: No  . Drug use: No     Family Hx: The patient's family history includes Cancer in his sister; Heart attack in his mother; Heart attack (age of onset: 36) in his brother. There is no history of Colon cancer, Stomach cancer, Rectal cancer, or Esophageal cancer.  ROS:   Please see the history of present illness.    All other systems reviewed and are negative.   Prior CV studies:   The following studies were reviewed today:  Stress test 02/2019  Nuclear stress EF: 51%.  There was no ST segment deviation noted during stress.  No T wave inversion was noted during stress.  Defect 1: There is a medium defect of  moderate severity present in the mid inferior, apical anterior, apical inferior and apex location.  Findings consistent with prior myocardial infarction.  This is an intermediate risk study.  The left ventricular ejection fraction is mildly decreased (45-54%).  No prior study for comparison   Fixed defect at apex, extending to distal anterior/inferior walls, consistent with infarct. No reversibility seen (and actually mild improvement in inferior walls with stress). Borderline low LV EF with hypokinesis in same walls as perfusion defect. Intermediate risk for infarct but no appreciable ischemia.   Echo 02/2017 Study Conclusions   - Procedure narrative: Transthoracic echocardiography. Image  quality was suboptimal. The study was technically difficult.  Intravenous contrast (Definity) was administered.  - Left ventricle: The cavity size was normal. Wall thickness was  normal. The estimated  ejection fraction was 55%. Although no  diagnostic regional wall motion abnormality was identified, this  possibility cannot be completely excluded on the basis of this  study. Doppler parameters are consistent with abnormal left  ventricular relaxation (grade 1 diastolic dysfunction).  - Aortic valve: There was no stenosis.  - Mitral valve: There was no significant regurgitation.  - Right ventricle: The cavity size was normal. Systolic function  was normal.  - Pulmonary arteries: No complete TR doppler jet so unable to  estimate PA systolic pressure.   Impressions:   - Technically difficult study with poor acoustic windows. Normal LV  size with EF 55%. Normal RV size and systolic function. No  significant valvular abnormalities.   CORONARY STENT INTERVENTION  01/2017  LEFT HEART CATH AND CORONARY ANGIOGRAPHY  Conclusion    Ost LAD lesion, 20 %stenosed.  Mid LAD lesion, 90 %stenosed.  Dist LAD lesion, 50 %stenosed.  Prox Cx to Mid Cx lesion, 40 %stenosed.  Ost  2nd Mrg to 2nd Mrg lesion, 25 %stenosed.  Ost RCA lesion, 65 %stenosed.  A STENT RESOLUTE ONYX 3.0X26 drug eluting stent was successfully placed, and overlaps previously placed stent.  Ost LAD to Mid LAD lesion, 30 %stenosed.  Post intervention, there is a 0% residual stenosis.  Mid RCA to Dist RCA lesion, 15 %stenosed.  There is mild left ventricular systolic dysfunction.  The left ventricular ejection fraction is 45-50% by visual estimate.   Multivessel CAD with 20% ostial LAD stenosis prior to previously placed tandem LAD stents with 30% diffuse narrowing in the stented segment and focal 85-90% in-stent restenosis in the midportion of the stent and 50% mid LAD stenosis; 40% proximal circumflex stenosis with mild luminal irregularity in the mid AV groove circumflex.;  65% focal eccentric proximal RCA stenosis with mild luminal irregularity in the mid RCA proximal to the acute margin.  Successful percutaneous coronary intervention for in-stent restenosis of the LAD with cutting balloon followed by insertion of a 3.026 mm Resolute Onyx DES stent postdilated 3.25 mm with the in-stent restenosis being reduced to 0%.  Mild LV dysfunction with focal mid-distal anterolateral hypocontractility an ejection fraction of 45%.  RECOMMENDATION: The patient will continue a dual antiplatelet therapy for minimum of 1 year.  Concomitant medical therapy will be added for his CAD with nitrates and beta blocker therapy.  High potency statin therapy will be initiated.  Consider initiation of ACE-I/ARB therapy.      Labs/Other Tests and Data Reviewed:    EKG:  No ECG reviewed.  Recent Labs: 05/06/2019: ALT 9; BUN 22; Creatinine, Ser 1.39; Potassium 4.3; Sodium 140   Recent Lipid Panel Lab Results  Component Value Date/Time   CHOL 123 05/06/2019 10:08 AM   TRIG 84 05/06/2019 10:08 AM   HDL 44 05/06/2019 10:08 AM   CHOLHDL 2.8 05/06/2019 10:08 AM   CHOLHDL 4 06/23/2009 11:47 AM   LDLCALC 63  05/06/2019 10:08 AM   LDLDIRECT 159.1 12/10/2007 08:51 AM    Wt Readings from Last 3 Encounters:  04/15/20 220 lb (99.8 kg)  05/09/19 231 lb 3.2 oz (104.9 kg)  02/25/19 224 lb (101.6 kg)     Risk Assessment/Calculations:      Objective:    Vital Signs:  Ht 5\' 9"  (1.753 m)   Wt 220 lb (99.8 kg)   BMI 32.49 kg/m   BP: 100/60  VITAL SIGNS:  reviewed GEN:  no acute distress PSYCH:  normal affect  ASSESSMENT & PLAN:    1. CAD  No angina.  Continue current medical therapy with aspirin, statin and beta-blocker.  2.  Chronic diastolic heart failure -Denies orthopnea, PND or lower extremity edema -Continue current dose of Lasix  3.  Hyperlipidemia -Continue statin  4.  Hypertension -Blood pressure soft normal  Patient is due for lipid panel and CMP.  He has follow-up with PCP next week and will have blood work done over there and fax results for our records.  COVID-19 Education: The signs and symptoms of COVID-19 were discussed with the patient and how to seek care for testing (follow up with PCP or arrange E-visit). The importance of social distancing was discussed today.  Time:   Today, I have spent 6 minutes with the patient with telehealth technology discussing the above problems.     Medication Adjustments/Labs and Tests Ordered: Current medicines are reviewed at length with the patient today.  Concerns regarding medicines are outlined above.   Tests Ordered: No orders of the defined types were placed in this encounter.   Medication Changes: No orders of the defined types were placed in this encounter.   Follow Up:  Virtual Visit  in 1 year(s)  Signed, Leanor Kail, PA  04/15/2020 8:59 AM    [place]

## 2020-04-15 ENCOUNTER — Encounter: Payer: Self-pay | Admitting: Physician Assistant

## 2020-04-15 ENCOUNTER — Telehealth: Payer: Self-pay | Admitting: *Deleted

## 2020-04-15 ENCOUNTER — Telehealth (INDEPENDENT_AMBULATORY_CARE_PROVIDER_SITE_OTHER): Payer: Medicare Other | Admitting: Physician Assistant

## 2020-04-15 ENCOUNTER — Other Ambulatory Visit: Payer: Self-pay

## 2020-04-15 VITALS — Ht 69.0 in | Wt 220.0 lb

## 2020-04-15 DIAGNOSIS — E785 Hyperlipidemia, unspecified: Secondary | ICD-10-CM | POA: Diagnosis not present

## 2020-04-15 DIAGNOSIS — I25118 Atherosclerotic heart disease of native coronary artery with other forms of angina pectoris: Secondary | ICD-10-CM

## 2020-04-15 DIAGNOSIS — I10 Essential (primary) hypertension: Secondary | ICD-10-CM

## 2020-04-15 DIAGNOSIS — E782 Mixed hyperlipidemia: Secondary | ICD-10-CM

## 2020-04-15 NOTE — Patient Instructions (Signed)
Medication Instructions:  Your physician recommends that you continue on your current medications as directed. Please refer to the Current Medication list given to you today.  *If you need a refill on your cardiac medications before your next appointment, please call your pharmacy*   Lab Work: None ordered  If you have labs (blood work) drawn today and your tests are completely normal, you will receive your results only by: . MyChart Message (if you have MyChart) OR . A paper copy in the mail If you have any lab test that is abnormal or we need to change your treatment, we will call you to review the results.   Testing/Procedures: None ordered   Follow-Up: At [facility], you and your health needs are our priority.  As part of our continuing mission to provide you with exceptional heart care, we have created designated Provider Care Teams.  These Care Teams include your primary Cardiologist (physician) and Advanced Practice Providers (APPs -  Physician Assistants and Nurse Practitioners) who all work together to provide you with the care you need, when you need it.  We recommend signing up for the patient portal called "MyChart".  Sign up information is provided on this After Visit Summary.  MyChart is used to connect with patients for Virtual Visits (Telemedicine).  Patients are able to view lab/test results, encounter notes, upcoming appointments, etc.  Non-urgent messages can be sent to your provider as well.   To learn more about what you can do with MyChart, go to [email or web address].    Your next appointment:   12 month(s)  The format for your next appointment:   In Person  Provider:   You may see Philip Nahser, MD or one of the following Advanced Practice Providers on your designated Care Team:    Scott Weaver, PA-C  Vin Bhagat, PA-C    Other Instructions   

## 2020-04-15 NOTE — Telephone Encounter (Signed)
°  Patient Consent for Virtual Visit         [name] has provided verbal consent on 04/15/2020 for a virtual visit (video or telephone).   CONSENT FOR VIRTUAL VISIT FOR:  [name]  By participating in this virtual visit I agree to the following:  I hereby voluntarily request, consent and authorize [facility] and its employed or contracted physicians, physician assistants, nurse practitioners or other licensed health care professionals (the Practitioner), to provide me with telemedicine health care services (the Services") as deemed necessary by the treating Practitioner. I acknowledge and consent to receive the Services by the Practitioner via telemedicine. I understand that the telemedicine visit will involve communicating with the Practitioner through live audiovisual communication technology and the disclosure of certain medical information by electronic transmission. I acknowledge that I have been given the opportunity to request an in-person assessment or other available alternative prior to the telemedicine visit and am voluntarily participating in the telemedicine visit.  I understand that I have the right to withhold or withdraw my consent to the use of telemedicine in the course of my care at any time, without affecting my right to future care or treatment, and that the Practitioner or I may terminate the telemedicine visit at any time. I understand that I have the right to inspect all information obtained and/or recorded in the course of the telemedicine visit and may receive copies of available information for a reasonable fee.  I understand that some of the potential risks of receiving the Services via telemedicine include:   Delay or interruption in medical evaluation due to technological equipment failure or disruption;  Information transmitted may not be sufficient (e.g. poor resolution of images) to allow for appropriate medical decision making by the Practitioner;  and/or   In rare instances, security protocols could fail, causing a breach of personal health information.  Furthermore, I acknowledge that it is my responsibility to provide information about my medical history, conditions and care that is complete and accurate to the best of my ability. I acknowledge that Practitioner's advice, recommendations, and/or decision may be based on factors not within their control, such as incomplete or inaccurate data provided by me or distortions of diagnostic images or specimens that may result from electronic transmissions. I understand that the practice of medicine is not an exact science and that Practitioner makes no warranties or guarantees regarding treatment outcomes. I acknowledge that a copy of this consent can be made available to me via my patient portal ([place]), or I can request a printed copy by calling the office of [facility].    I understand that my insurance will be billed for this visit.   I have read or had this consent read to me.  I understand the contents of this consent, which adequately explains the benefits and risks of the Services being provided via telemedicine.   I have been provided ample opportunity to ask questions regarding this consent and the Services and have had my questions answered to my satisfaction.  I give my informed consent for the services to be provided through the use of telemedicine in my medical care

## 2020-04-23 ENCOUNTER — Other Ambulatory Visit: Payer: Self-pay | Admitting: Cardiovascular Disease

## 2020-04-29 DIAGNOSIS — I10 Essential (primary) hypertension: Secondary | ICD-10-CM | POA: Diagnosis not present

## 2020-04-29 DIAGNOSIS — N182 Chronic kidney disease, stage 2 (mild): Secondary | ICD-10-CM | POA: Diagnosis not present

## 2020-04-29 DIAGNOSIS — E1122 Type 2 diabetes mellitus with diabetic chronic kidney disease: Secondary | ICD-10-CM | POA: Diagnosis not present

## 2020-04-29 DIAGNOSIS — I5022 Chronic systolic (congestive) heart failure: Secondary | ICD-10-CM | POA: Diagnosis not present

## 2020-04-29 DIAGNOSIS — E782 Mixed hyperlipidemia: Secondary | ICD-10-CM | POA: Diagnosis not present

## 2020-04-29 DIAGNOSIS — I130 Hypertensive heart and chronic kidney disease with heart failure and stage 1 through stage 4 chronic kidney disease, or unspecified chronic kidney disease: Secondary | ICD-10-CM | POA: Diagnosis not present

## 2020-05-04 ENCOUNTER — Ambulatory Visit (INDEPENDENT_AMBULATORY_CARE_PROVIDER_SITE_OTHER): Payer: Medicare Other | Admitting: Otolaryngology

## 2020-05-04 ENCOUNTER — Other Ambulatory Visit: Payer: Self-pay

## 2020-05-04 ENCOUNTER — Encounter (INDEPENDENT_AMBULATORY_CARE_PROVIDER_SITE_OTHER): Payer: Self-pay | Admitting: Otolaryngology

## 2020-05-04 VITALS — Temp 96.6°F

## 2020-05-04 DIAGNOSIS — H903 Sensorineural hearing loss, bilateral: Secondary | ICD-10-CM

## 2020-05-04 DIAGNOSIS — H9313 Tinnitus, bilateral: Secondary | ICD-10-CM

## 2020-05-04 DIAGNOSIS — I25118 Atherosclerotic heart disease of native coronary artery with other forms of angina pectoris: Secondary | ICD-10-CM | POA: Diagnosis not present

## 2020-05-04 DIAGNOSIS — H6123 Impacted cerumen, bilateral: Secondary | ICD-10-CM | POA: Diagnosis not present

## 2020-05-04 NOTE — Progress Notes (Signed)
HPI: [name] is a 79 y.o. male who presents for evaluation of ringing in his hears that he has had for over 5 years.  It seems to be in both ears but perhaps worse on the left side.  He used to work around a lot of loud noise.  However he feels like he hears well okay and does not notice any hearing difficulty. Denies any ear pain or drainage.  Past Medical History:  Diagnosis Date  . Adenomatous colon polyp   . Arthritis   . CAD (coronary artery disease)    a. MI in 1996 with stent placement b. cath 01/18/17 - S/p PCI of in-stent restenosis of pLAD with cutting ballon & DES; 20% ostial LAD; 40% pro Cx; 60% focal pRCA  . Clostridium difficile infection   . Depression   . Diverticulosis   . H. pylori infection   . HTN (hypertension)   . Hyperlipidemia   . MI (myocardial infarction) [facility])    Past Surgical History:  Procedure Laterality Date  . BRAIN TUMOR EXCISION  1954   benign tumor in back of head, done at  [facility]  . CARDIAC CATHETERIZATION    . COLONOSCOPY     hx of polyps  . [place]; 1997; 01/18/2017  . CORONARY STENT INTERVENTION N/A 01/18/2017   Procedure: CORONARY STENT INTERVENTION;  Surgeon: Troy Sine, MD;  Location: [facility];  Service: Cardiovascular;  Laterality: N/A;  . ESOPHAGOGASTRODUODENOSCOPY (EGD) WITH PROPOFOL N/A 06/14/2018   Procedure: ESOPHAGOGASTRODUODENOSCOPY (EGD) WITH PROPOFOL;  Surgeon: Gatha Mayer, MD;  [facility];  Service: Endoscopy;  Laterality: N/A;  . JOINT REPLACEMENT    . LEFT HEART CATH AND CORONARY ANGIOGRAPHY N/A 01/18/2017   Procedure: LEFT HEART CATH AND CORONARY ANGIOGRAPHY;  Surgeon: Troy Sine, MD;  Location: [facility];  Service: Cardiovascular;  Laterality: N/A;  . stent x2 in the LAD  with intra-aortic balloon pump support  1996  . TONSILLECTOMY    . TOTAL KNEE ARTHROPLASTY Bilateral    Social History   Socioeconomic History  . Marital status: Married     Spouse name: Not on file  . Number of children: 1  . Years of education: Not on file  . Highest education level: Not on file  Occupational History  . Occupation: Retired  Tobacco Use  . Smoking status: Former Smoker    Quit date: 06/06/1994    Years since quitting: 25.9  . Smokeless tobacco: Never Used  Vaping Use  . Vaping Use: Never used  Substance and Sexual Activity  . Alcohol use: No  . Drug use: No  . Sexual activity: Yes  Other Topics Concern  . Not on file  Social History Narrative  . Not on file   Social Determinants of Health   Financial Resource Strain:   . Difficulty of Paying Living Expenses: Not on file  Food Insecurity:   . Worried About Charity fundraiser in the Last Year: Not on file  . Ran Out of Food in the Last Year: Not on file  Transportation Needs:   . Lack of Transportation (Medical): Not on file  . Lack of Transportation (Non-Medical): Not on file  Physical Activity:   . Days of Exercise per Week: Not on file  . Minutes of Exercise per Session: Not on file  Stress:   . Feeling of Stress : Not on file  Social Connections:   . Frequency  of Communication with Friends and Family: Not on file  . Frequency of Social Gatherings with Friends and Family: Not on file  . Attends Religious Services: Not on file  . Active Member of Clubs or Organizations: Not on file  . Attends Archivist Meetings: Not on file  . Marital Status: Not on file   Family History  Problem Relation Age of Onset  . Heart attack Mother        87s  . Heart attack Brother 33  . Cancer Sister        unsure of type  . Colon cancer Neg Hx   . Stomach cancer Neg Hx   . Rectal cancer Neg Hx   . Esophageal cancer Neg Hx    No Known Allergies Prior to Admission medications   Medication Sig Start Date End Date Taking? Authorizing Provider  atorvastatin (LIPITOR) 80 MG tablet Take 80 mg by mouth daily.     Yes [provider]  clopidogrel (PLAVIX) 75 MG  tablet TAKE 1 TABLET BY MOUTH EVERY DAY 04/23/20  Yes Nahser, Wonda Cheng, MD  escitalopram (LEXAPRO) 20 MG tablet Take 20 mg by mouth daily. 05/13/18  Yes [provider]  esomeprazole (NEXIUM) 40 MG capsule Take 40 mg by mouth daily at 12 noon.   Yes [provider]  ezetimibe (ZETIA) 10 MG tablet TAKE 1 TABLET BY MOUTH EVERY DAY 03/15/19  Yes Nahser, Wonda Cheng, MD  fenofibrate (TRICOR) 145 MG tablet TAKE 1 TABLET BY MOUTH EVERY DAY 10/07/19  Yes Nahser, Wonda Cheng, MD  furosemide (LASIX) 20 MG tablet Take 20 mg by mouth daily as needed for fluid or edema.   Yes [provider]  gabapentin (NEURONTIN) 300 MG capsule Take 300 mg by mouth daily. 04/12/19  Yes [provider]  HYDROcodone-acetaminophen (NORCO) 7.5-325 MG tablet Take 1 tablet by mouth daily as needed. For pain. 01/17/17  Yes [provider]  meloxicam (MOBIC) 7.5 MG tablet Take 7.5 mg by mouth daily. 09/24/18  Yes [provider]  metoprolol succinate (TOPROL-XL) 50 MG 24 hr tablet Take 50 mg by mouth daily. 04/12/19  Yes [provider]  nitroGLYCERIN (NITROSTAT) 0.4 MG SL tablet Place 1 tablet (0.4 mg total) under the tongue every 5 (five) minutes as needed for chest pain. 01/19/17  Yes Carlota Raspberry, Tiffany, PA-C  tiZANidine (ZANAFLEX) 4 MG tablet Take 1 tablet by mouth every 8 (eight) hours as needed for muscle spasms.  02/13/18  Yes [provider]  isosorbide mononitrate (IMDUR) 60 MG 24 hr tablet Take 1 tablet (60 mg total) by mouth daily. 02/14/19 05/15/19  Nahser, Wonda Cheng, MD     Positive ROS: Otherwise negative  All other systems have been reviewed and were otherwise negative with the exception of those mentioned in the HPI and as above.  Physical Exam: Constitutional: Alert, well-appearing, no acute distress Ears: External ears without lesions or tenderness. Ear canals reveal moderate amount of nonobstructing wax in both ear canals that was cleaned with curette and  suction.  TMs were clear bilaterally with good mobility on pneumatic otoscopy. Nasal: External nose without lesions.. Clear nasal passages Oral: Lips and gums without lesions. Tongue and palate mucosa without lesions. Posterior oropharynx clear. Neck: No palpable adenopathy or masses Respiratory: Breathing comfortably  Skin: No facial/neck lesions or rash noted.  Cerumen impaction removal  Date/Time: 05/04/2020 1:38 PM Performed by: Rozetta Nunnery, MD Authorized by: Rozetta Nunnery, MD   Consent:  Consent obtained:  Verbal   Consent given by:  Patient   Risks discussed:  Pain and bleeding Procedure details:    Location:  L ear and R ear   Procedure type: curette and suction   Post-procedure details:    Inspection:  TM intact and canal normal   Hearing quality:  Improved   Patient tolerance of procedure:  Tolerated well, no immediate complications Comments:     TMs are clear bilaterally.  Audiogram demonstrated essentially normal hearing in both ears up to 2000 frequency but then he has a significant drop in hearing in both ears above 2000 frequency which is slightly worse on the left side.  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  Plan: Reviewed with the patient concerning tinnitus and treatment options including using background noise in quiet environments when the tinnitus is bad and/or use of hearing aids. Also gave him samples of Lipo flavonoid to try as this is beneficial in some people. Recommended using ear protection when around loud noise.  Radene Journey, MD

## 2020-05-05 DIAGNOSIS — M5416 Radiculopathy, lumbar region: Secondary | ICD-10-CM | POA: Diagnosis not present

## 2020-05-06 DIAGNOSIS — E1122 Type 2 diabetes mellitus with diabetic chronic kidney disease: Secondary | ICD-10-CM | POA: Diagnosis not present

## 2020-05-06 DIAGNOSIS — I209 Angina pectoris, unspecified: Secondary | ICD-10-CM | POA: Diagnosis not present

## 2020-05-06 DIAGNOSIS — I5022 Chronic systolic (congestive) heart failure: Secondary | ICD-10-CM | POA: Diagnosis not present

## 2020-05-06 DIAGNOSIS — Z0000 Encounter for general adult medical examination without abnormal findings: Secondary | ICD-10-CM | POA: Diagnosis not present

## 2020-05-06 DIAGNOSIS — I2510 Atherosclerotic heart disease of native coronary artery without angina pectoris: Secondary | ICD-10-CM | POA: Diagnosis not present

## 2020-05-06 DIAGNOSIS — I130 Hypertensive heart and chronic kidney disease with heart failure and stage 1 through stage 4 chronic kidney disease, or unspecified chronic kidney disease: Secondary | ICD-10-CM | POA: Diagnosis not present

## 2020-05-06 DIAGNOSIS — E782 Mixed hyperlipidemia: Secondary | ICD-10-CM | POA: Diagnosis not present

## 2020-05-06 DIAGNOSIS — E1121 Type 2 diabetes mellitus with diabetic nephropathy: Secondary | ICD-10-CM | POA: Diagnosis not present

## 2020-05-06 DIAGNOSIS — F321 Major depressive disorder, single episode, moderate: Secondary | ICD-10-CM | POA: Diagnosis not present

## 2020-05-06 DIAGNOSIS — N1831 Chronic kidney disease, stage 3a: Secondary | ICD-10-CM | POA: Diagnosis not present

## 2020-05-07 ENCOUNTER — Encounter (INDEPENDENT_AMBULATORY_CARE_PROVIDER_SITE_OTHER): Payer: Self-pay | Admitting: Otolaryngology

## 2020-06-16 DIAGNOSIS — Z1152 Encounter for screening for COVID-19: Secondary | ICD-10-CM | POA: Diagnosis not present

## 2020-07-03 ENCOUNTER — Ambulatory Visit: Payer: Medicare Other | Admitting: Podiatry

## 2020-07-07 ENCOUNTER — Ambulatory Visit: Payer: Medicare Other | Admitting: Podiatry

## 2020-07-20 ENCOUNTER — Telehealth: Payer: Self-pay | Admitting: Cardiovascular Disease

## 2020-07-20 DIAGNOSIS — M5459 Other low back pain: Secondary | ICD-10-CM | POA: Diagnosis not present

## 2020-07-20 NOTE — Telephone Encounter (Signed)
Pt c/o Shortness Of Breath: STAT if SOB developed within the last 24 hours or pt is noticeably SOB on the phone  1. Are you currently SOB (can you hear that pt is SOB on the phone)?  Yes but it has been a couple of months   2. How long have you been experiencing SOB? A couple of months   3. Are you SOB when sitting or when up moving around? Both   4. Are you currently experiencing any other symptoms? Pt denies any other symptoms.  Pt went to see his PCP and they advise him he need to fu with Dr Acie Fredrickson as soon as he could .  Nothing available soon . Pt would like in office only   Best number [phone]

## 2020-07-20 NOTE — Telephone Encounter (Signed)
Patient calling in for appointment with Dr. Acie Fredrickson. He states he saw his PCP today and they recommended that he f/u with his cardiologist as soon as possible.   Patient reports being SOB for the past month, worse on exertion but also SOB at rest. Patient reports about 4 pound weight gain in the past month, denies any swelling.  Patient unable to take BP at home. Patient denies any additional sodium in his diet. Patient states that he has been taking all meds as ordered.   PRN 20 mg daily Lasix listed but patient is unaware if he has taken this. He states he takes everything he is supposed to but unable to elaborate if he takes this daily or not at all.   Patient denies any other cardiac symptoms such as N/V, CP, fever etc  Made patient appt with Dr. Acie Fredrickson on 2/18 at 1:40 pm Patient aware to call back with any new or worsening symptoms.

## 2020-07-21 ENCOUNTER — Telehealth: Payer: Self-pay | Admitting: *Deleted

## 2020-07-21 NOTE — Telephone Encounter (Signed)
   [facility] Pre-operative Risk Assessment    HEARTCARE STAFF: - Please ensure there is not already an duplicate clearance open for this procedure. - Under Visit Info/Reason for Call, type in Other and utilize the format Clearance MM/DD/YY or Clearance TBD. Do not use dashes or single digits. - If request is for dental extraction, please clarify the # of teeth to be extracted.  Request for surgical clearance:  1. What type of surgery is being performed? LUMBAR ESI   2. When is this surgery scheduled? 08/06/20   3. What type of clearance is required (medical clearance vs. Pharmacy clearance to hold med vs. Both)? MEDICAL  4. Are there any medications that need to be held prior to surgery and how long? PLAVIX x 5 DAYS PRIOR TO PROCEDURE    5. Practice name and name of physician performing surgery? Marisa Sprinkles; MD NOT LISTED   6. What is the office phone number? [phone]   7.   What is the office fax number? [phone]  8.   Anesthesia type (None, local, MAC, general) ? NOT LISTED   Julaine Hua 07/21/2020, 3:17 PM  _________________________________________________________________   (provider comments below)

## 2020-07-22 NOTE — Telephone Encounter (Addendum)
    Patient called in recently with symptoms of shortness of breath. He has appt with Dr. Acie Fredrickson on 07/24/20. Surgical clearance and recommendations regarding antiplatelet Rx can be made at that [place]. Will send note to Dr. Acie Fredrickson. [name], PA-C    07/22/2020 9:50 AM

## 2020-07-23 ENCOUNTER — Other Ambulatory Visit: Payer: Self-pay

## 2020-07-23 NOTE — Telephone Encounter (Signed)
ERROR

## 2020-07-24 ENCOUNTER — Ambulatory Visit
Admission: RE | Admit: 2020-07-24 | Discharge: 2020-07-24 | Disposition: A | Discharge status: Home / Self Care | Payer: Medicare Other | Source: Ambulatory Visit | Attending: Cardiovascular Disease | Admitting: Cardiovascular Disease

## 2020-07-24 ENCOUNTER — Encounter: Payer: Self-pay | Admitting: Cardiovascular Disease

## 2020-07-24 ENCOUNTER — Other Ambulatory Visit: Payer: Self-pay

## 2020-07-24 ENCOUNTER — Ambulatory Visit (INDEPENDENT_AMBULATORY_CARE_PROVIDER_SITE_OTHER): Payer: Medicare Other | Admitting: Cardiovascular Disease

## 2020-07-24 VITALS — BP 106/82 | HR 57 | Ht 69.0 in | Wt 230.0 lb

## 2020-07-24 DIAGNOSIS — I25118 Atherosclerotic heart disease of native coronary artery with other forms of angina pectoris: Secondary | ICD-10-CM | POA: Diagnosis not present

## 2020-07-24 DIAGNOSIS — R0602 Shortness of breath: Secondary | ICD-10-CM | POA: Diagnosis not present

## 2020-07-24 DIAGNOSIS — I10 Essential (primary) hypertension: Secondary | ICD-10-CM

## 2020-07-24 MED ORDER — NITROGLYCERIN 0.4 MG SL SUBL: 0.4000 mg | SUBLINGUAL_TABLET | SUBLINGUAL | 12 refills | Status: DC | PRN

## 2020-07-24 NOTE — Patient Instructions (Signed)
Medication Instructions:  Your physician recommends that you continue on your current medications as directed. Please refer to the Current Medication list given to you today.  *If you need a refill on your cardiac medications before your next appointment, please call your pharmacy*   Lab Work: TODAY:BMET, CBC with Diff  If you have labs (blood work) drawn today and your tests are completely normal, you will receive your results only by: Marland Kitchen MyChart Message (if you have MyChart) OR . A paper copy in the mail If you have any lab test that is abnormal or we need to change your treatment, we will call you to review the results.   Testing/Procedures: Due to recent COVID-19 restrictions implemented by our local and state authorities and in an effort to keep both patients and staff as safe as possible, our hospital system requires COVID-19 testing prior to certain scheduled hospital procedures.  Please go to [facility]. [place], [place] [address] on 2/19 at 10:45am.  .  This is a drive up testing site.  You will not need to exit your vehicle. You must agree to self-quarantine from the time of your testing until the procedure date on 2/22.  This should included staying home with ONLY the people you live with.  Avoid take-out, grocery store shopping or leaving the house for any non-emergent reason.  Failure to have your COVID-19 test done on the date and time you have been scheduled will result in cancellation of your procedure.  Please call our office at [phone] if you have any questions.     [facility] [place], [place] [address] Dept: [phone] Loc: [phone]  LAYMOND POSTLE  07/24/2020  You are scheduled for a Cardiac Catheterization on Tuesday, February 22 with Dr. Glenetta Hew.  1. Please arrive at the [facility] (Main Entrance A) at [facility]: [address] [place], [place] [address] at 6:30 AM (This time is two hours before your procedure to ensure your preparation). Free valet parking service is available.   Special note: Every effort is made to have your procedure done on time. Please understand that emergencies sometimes delay scheduled procedures.  2. Diet: Do not eat solid foods after midnight.  The patient may have clear liquids until 5am upon the day of the procedure.  3. Labs: You will need to have blood drawn TODAY  4. Medication instructions in preparation for your procedure:   Contrast Allergy: No  DO not take lasix the day before,or the morning of your procedure.   On the morning of your procedure, take your Aspirin and Plavix/Clopidogrel and any morning medicines NOT listed above.  You may use sips of water.  5. Plan for one night stay--bring personal belongings. 6. Bring a current list of your medications and current insurance cards. 7. You MUST have a responsible person to drive you home. 8. Someone MUST be with you the first 24 hours after you arrive home or your discharge will be delayed. 9. Please wear clothes that are easy to get on and off and wear slip-on shoes.  Thank you for allowing [place] to care for you!   -- [facility]   Follow-Up: At [facility], you and your health needs are our priority.  As part of our continuing mission to provide you with exceptional heart care, we have created designated Provider Care Teams.  These Care  Teams include your primary Cardiologist (physician) and Advanced Practice Providers (APPs -  Physician Assistants and Nurse Practitioners) who all work together to provide you with the care you need, when you need it.  We recommend signing up for the patient portal called "MyChart".  Sign up information is provided on this After Visit Summary.  MyChart is used to connect with patients for Virtual Visits (Telemedicine).  Patients are able to view lab/test  results, encounter notes, upcoming appointments, etc.  Non-urgent messages can be sent to your provider as well.   To learn more about what you can do with MyChart, go to [email or web address].    Your next appointment:  09/16/20 at 12:15pm 2 month(s)  The format for your next appointment:   In Person  Provider:   You will see one of the following Advanced Practice Providers on your designated Care Team:    [name], [place]

## 2020-07-24 NOTE — H&P (View-Only) (Signed)
Cardiology Office Note:    Date:  07/24/2020   ID:  [name], DOB September 13, 1940, MRN [id number]  PCP:  [name], MD  Cardiologist:  [name], MD    Referring MD: [name], MD   Chief Complaint  Patient presents with  . Coronary Artery Disease        [name] is a 80 y.o. male with a hx of Coronary artery disease been years ago. He is a previous patient of Dr. Bing Quarry. He presents now with symptoms of exertional chest pain and shortness breath.  He has shortness of breath at night or during the day  Walking 5-6 minutes causes significant dyspnea.  Also has claudication in his legs with walking   Has occasional chest pain  - usually when he is at rest. Has chest squeezing. - occurs randomly  Has lots of indigestion - occurs randomly   Also has lots of sweating - not necessarliy related to his chest tightness  does not feel similar to his previous MI pain .   No CP since that time   Hx of MI in 1996 -  Had stent by [name] Stopped smoking at that time   Does not exercise regularly    .   Works out in the yard.   Sept. 5, 2018: [name] is seen today for follow up visit.   S/p recent stenting of his prox - mid LAD  Hx of CAD , stenting by [name] in the past .   PTs BP has been low Has been on a diet and has been cutting back on his protein and his food in general .  Has lost 8.5 lbs   Had an episode of CP at night.   Better after NTG . Still fairly active .   Walks a little ,  Just started a walking program .   Walks 1/2 mile a day without CP  His medical doctor had recently started him on a BP med and he has stopped it now (does not remember the name of it)   Echo Aug. 28 shows normal LV function   August 18, 2017: [name] is seen today for follow-up of his coronary artery disease. He has a history of coronary artery disease and is status post stent placement to the left anterior descending artery in 01/29/17 Overall he is doing  very well.  Is not had any episodes of angina since his PCI in 29-Jan-2017.  He does have some shortness of breath with exertion.  He is exercising fairly regularly.  Wife is had problems and he has had to help her.  He is lost some weight since his angioplasty.    His last lipid level shows a triglyceride level of 229.  His total cholesterol is 158.  HDL is 39.  LDL 73.  Jan. 6, 2020  [name] is seen today for follow-up of his coronary artery disease.  He status post stent placement to the left anterior descending artery in 2017/01/29.  Wife passed away several months .  Pneumonia and sepsis. Had been married 42 years.    Has not had any angina      June 19, 2018  [name] was seen last week for follow-up of his coronary artery disease.  Several days later he was seen in the [facility] emergency room with a GI bleed. Is now on Protonox and Plavix is on hold until Jan. 27.   Stent placent was 2017/01/29.  ECG revealed 2 ulcers - 1 large and 1 small  Seems to be getting better  Still has DOE   Sept. 10, 2020 :  [name] is seen today  Hx of CAD and GI bleeding  Has had tightness in his chest  Has chest tightness, pressure with exertion. Lasted 1 hour- took a SL NTG which helped  Occurs with rest and exertion  Radiates to left shoulder , burning shoulder pain  NTG helps  Has been going on for several months . Not associated with syncope or near syncope  Has taken 2 SL  NTG over the past several weeks.   Has severe leg pain  Has severe leg weakness with walking  Will get lower extremity arterial evaluation ( ABI or duplex scan)  Does not get any regular exercise  Has been eating more take out than usual   May 09, 2019:  [name] is seen today for a follow-up visit for his coronary artery disease  He has a history of claudication-like symptoms but lower extremity arterial duplex scan was normal. He has a history of hypertension hyperlipidemia.  He has a past history of  smoking.  Has a hx of GI bleeding .   Seems to be improving. Has a history of coronary artery disease.  Stress Myoview study from September, 2020 reveals an old anterior wall myocardial infarction but no ischemia.  His ejection fraction was 51% at that time.  Recent labs from May 06, 2019 reveals a total cholesterol of 123.  The HDL is 44.  LDL is 63.  The triglyceride level is 84.  Glucose is 102.  Sodium and potassium are normal.  Creatinine is 1.39.  Liver enzymes are  Normal.  Feb. 18, 2022: [name] is seen for follow-up of his coronary artery disease.  He is also here for preoperative evaluation prior to lumber steroid injection . He is on plavix 75 mg a day .  He had stenting of his mid LAD in Aug. 2018 myoview from Sept. 2020 reveals old ant Mi.  No ischemia  Having some chest pain for the past several months  Seems to be getting worse , needs to have his NTG refilled . Seems to radiate to his left sholder  Lots of dyspnea  Fatigue  Avoiding salt .    Past Medical History:  Diagnosis Date  . Adenomatous colon polyp   . Arthritis   . CAD (coronary artery disease)    a. MI in 1996 with stent placement b. cath 01/18/17 - S/p PCI of in-stent restenosis of pLAD with cutting ballon & DES; 20% ostial LAD; 40% pro Cx; 60% focal pRCA  . Clostridium difficile infection   . Depression   . Diverticulosis   . H. pylori infection   . HTN (hypertension)   . Hyperlipidemia   . MI (myocardial infarction) [facility])     Past Surgical History:  Procedure Laterality Date  . BRAIN TUMOR EXCISION  1954   benign tumor in back of head, done at  [facility]  . CARDIAC CATHETERIZATION    . COLONOSCOPY     hx of polyps  . [place]; 1997; 01/18/2017  . CORONARY STENT INTERVENTION N/A 01/18/2017   Procedure: CORONARY STENT INTERVENTION;  Surgeon: Troy Sine, MD;  Location: [facility];  Service: Cardiovascular;  Laterality: N/A;  .  ESOPHAGOGASTRODUODENOSCOPY (EGD) WITH PROPOFOL N/A 06/14/2018   Procedure: ESOPHAGOGASTRODUODENOSCOPY (EGD) WITH PROPOFOL;  Surgeon: Gatha Mayer, MD;  Location: [facility];  Service: Endoscopy;  Laterality: N/A;  . JOINT REPLACEMENT    . LEFT HEART CATH AND CORONARY ANGIOGRAPHY N/A 01/18/2017   Procedure: LEFT HEART CATH AND CORONARY ANGIOGRAPHY;  Surgeon: Troy Sine, MD;  Location: [facility];  Service: Cardiovascular;  Laterality: N/A;  . stent x2 in the LAD  with intra-aortic balloon pump support  1996  . TONSILLECTOMY    . TOTAL KNEE ARTHROPLASTY Bilateral     Current Medications: Current Meds  Medication Sig  . atorvastatin (LIPITOR) 80 MG tablet Take 80 mg by mouth daily.  . clopidogrel (PLAVIX) 75 MG tablet TAKE 1 TABLET BY MOUTH EVERY DAY  . escitalopram (LEXAPRO) 20 MG tablet Take 20 mg by mouth daily.  Marland Kitchen esomeprazole (NEXIUM) 40 MG capsule Take 40 mg by mouth daily at 12 noon.  . fenofibrate (TRICOR) 145 MG tablet TAKE 1 TABLET BY MOUTH EVERY DAY  . furosemide (LASIX) 20 MG tablet Take 20 mg by mouth daily as needed for fluid or edema.  . gabapentin (NEURONTIN) 300 MG capsule Take 300 mg by mouth daily.  Marland Kitchen HYDROcodone-acetaminophen (NORCO) 7.5-325 MG tablet Take 1 tablet by mouth daily as needed. For pain.  . meloxicam (MOBIC) 7.5 MG tablet Take 7.5 mg by mouth daily.  . metoprolol succinate (TOPROL-XL) 50 MG 24 hr tablet Take 50 mg by mouth daily.  . [DISCONTINUED] ezetimibe (ZETIA) 10 MG tablet TAKE 1 TABLET BY MOUTH EVERY DAY  . [DISCONTINUED] nitroGLYCERIN (NITROSTAT) 0.4 MG SL tablet Place 1 tablet (0.4 mg total) under the tongue every 5 (five) minutes as needed for chest pain.  . [DISCONTINUED] tiZANidine (ZANAFLEX) 4 MG tablet Take 1 tablet by mouth every 8 (eight) hours as needed for muscle spasms.      Allergies:   Patient has no known allergies.   Social History   Socioeconomic History  . Marital status: Married    Spouse name: Not on file  .  Number of children: 1  . Years of education: Not on file  . Highest education level: Not on file  Occupational History  . Occupation: Retired  Tobacco Use  . Smoking status: Former Smoker    Quit date: 06/06/1994    Years since quitting: 26.1  . Smokeless tobacco: Never Used  Vaping Use  . Vaping Use: Never used  Substance and Sexual Activity  . Alcohol use: No  . Drug use: No  . Sexual activity: Yes  Other Topics Concern  . Not on file  Social History Narrative  . Not on file   Social Determinants of Health   Financial Resource Strain: Not on file  Food Insecurity: Not on file  Transportation Needs: Not on file  Physical Activity: Not on file  Stress: Not on file  Social Connections: Not on file     Family History: The patient's family history includes Cancer in his sister; Heart attack in his mother; Heart attack (age of onset: 18) in his brother. There is no history of Colon cancer, Stomach cancer, Rectal cancer, or Esophageal cancer. ROS:   Please see the history of present illness.     All other systems reviewed and are negative.  EKGs/Labs/Other Studies Reviewed:     Physical Exam: Blood pressure 106/82, pulse (!) 57, height 5\' 9"  (1.753 m), weight 230 lb (104.3 kg), SpO2 94 %.  GEN:  Well nourished, well developed in no acute distress HEENT: Normal NECK: No JVD; No carotid bruits LYMPHATICS: No lymphadenopathy CARDIAC: RRR  RESPIRATORY:   Rales in both bases, L > R  ABDOMEN: Soft, non-tender, non-distended MUSCULOSKELETAL:  No edema; No deformity  SKIN: Warm and dry NEUROLOGIC:  Alert and oriented x 3    EKG:    Feb. 18, 2022 Sinus brady , LBBB     Recent Labs: No results found for requested labs within last 8760 hours.  Recent Lipid Panel    Component Value Date/Time   CHOL 123 05/06/2019 1008   TRIG 84 05/06/2019 1008   HDL 44 05/06/2019 1008   CHOLHDL 2.8 05/06/2019 1008   CHOLHDL 4 06/23/2009 1147   VLDL 23.4 06/23/2009 1147   LDLCALC  63 05/06/2019 1008   LDLDIRECT 159.1 12/10/2007 0851      ASSESSMENT: PLAN:     1. Coronary artery disease:    He is having worseiing angina and I think he needs a cath .   He also has rales in both bases  - denies fever,   Has slightl cough in the am.  Is already on lasix  Will get a PA and lat CXR prior to cath ,  CBC with diff.   We have discussed risks, benefits, options of cath .  He understands and agrees to proceed.     2.  Chronic diastolic congestive heart failure.    stable    3.  Hyperlipidemia:   Lipids are wll controlled.    4.  Possible claudication:   5.   He needs to have a steroid injection into his back .  Will need to wait and clear him after we see how the cath turns out .    Medication Adjustments/Labs and Tests Ordered: Current medicines are reviewed at length with the patient today.  Concerns regarding medicines are outlined above.  Orders Placed This Encounter  Procedures  . DG Chest 2 View  . Basic metabolic panel  . CBC w/Diff     Meds ordered this encounter  Medications  . nitroGLYCERIN (NITROSTAT) 0.4 MG SL tablet    Sig: Place 1 tablet (0.4 mg total) under the tongue every 5 (five) minutes as needed for chest pain.    Dispense:  25 tablet    Refill:  12    Signed, [name], MD  07/24/2020 6:14 PM    [facility]

## 2020-07-24 NOTE — Progress Notes (Signed)
Cardiology Office Note:    Date:  07/24/2020   ID:  [name], DOB 1941-03-28, MRN [id number]  PCP:  [name], MD  Cardiologist:  [name], MD    Referring MD: [name], MD   Chief Complaint  Patient presents with  . Coronary Artery Disease        [name] is a 80 y.o. male with a hx of Coronary artery disease been years ago. He is a previous patient of Dr. Bing Quarry. He presents now with symptoms of exertional chest pain and shortness breath.  He has shortness of breath at night or during the day  Walking 5-6 minutes causes significant dyspnea.  Also has claudication in his legs with walking   Has occasional chest pain  - usually when he is at rest. Has chest squeezing. - occurs randomly  Has lots of indigestion - occurs randomly   Also has lots of sweating - not necessarliy related to his chest tightness  does not feel similar to his previous MI pain .   No CP since that time   Hx of MI in 1996 -  Had stent by [name] Stopped smoking at that time   Does not exercise regularly    .   Works out in the yard.   Sept. 5, 2018: [name] is seen today for follow up visit.   S/p recent stenting of his prox - mid LAD  Hx of CAD , stenting by [name] in the past .   PTs BP has been low Has been on a diet and has been cutting back on his protein and his food in general .  Has lost 8.5 lbs   Had an episode of CP at night.   Better after NTG . Still fairly active .   Walks a little ,  Just started a walking program .   Walks 1/2 mile a day without CP  His medical doctor had recently started him on a BP med and he has stopped it now (does not remember the name of it)   Echo Aug. 28 shows normal LV function   August 18, 2017: [name] is seen today for follow-up of his coronary artery disease. He has a history of coronary artery disease and is status post stent placement to the left anterior descending artery in 27-Jan-2017 Overall he is doing  very well.  Is not had any episodes of angina since his PCI in 01-27-2017.  He does have some shortness of breath with exertion.  He is exercising fairly regularly.  Wife is had problems and he has had to help her.  He is lost some weight since his angioplasty.    His last lipid level shows a triglyceride level of 229.  His total cholesterol is 158.  HDL is 39.  LDL 73.  Jan. 6, 2020  [name] is seen today for follow-up of his coronary artery disease.  He status post stent placement to the left anterior descending artery in 27-Jan-2017.  Wife passed away several months .  Pneumonia and sepsis. Had been married 30 years.    Has not had any angina      June 19, 2018  [name] was seen last week for follow-up of his coronary artery disease.  Several days later he was seen in the [facility] emergency room with a GI bleed. Is now on Protonox and Plavix is on hold until Jan. 27.   Stent placent was January 27, 2017.  ECG revealed 2 ulcers - 1 large and 1 small  Seems to be getting better  Still has DOE   Sept. 10, 2020 :  [name] is seen today  Hx of CAD and GI bleeding  Has had tightness in his chest  Has chest tightness, pressure with exertion. Lasted 1 hour- took a SL NTG which helped  Occurs with rest and exertion  Radiates to left shoulder , burning shoulder pain  NTG helps  Has been going on for several months . Not associated with syncope or near syncope  Has taken 2 SL  NTG over the past several weeks.   Has severe leg pain  Has severe leg weakness with walking  Will get lower extremity arterial evaluation ( ABI or duplex scan)  Does not get any regular exercise  Has been eating more take out than usual   May 09, 2019:  [name] is seen today for a follow-up visit for his coronary artery disease  He has a history of claudication-like symptoms but lower extremity arterial duplex scan was normal. He has a history of hypertension hyperlipidemia.  He has a past history of  smoking.  Has a hx of GI bleeding .   Seems to be improving. Has a history of coronary artery disease.  Stress Myoview study from September, 2020 reveals an old anterior wall myocardial infarction but no ischemia.  His ejection fraction was 51% at that time.  Recent labs from May 06, 2019 reveals a total cholesterol of 123.  The HDL is 44.  LDL is 63.  The triglyceride level is 84.  Glucose is 102.  Sodium and potassium are normal.  Creatinine is 1.39.  Liver enzymes are  Normal.  Feb. 18, 2022: [name] is seen for follow-up of his coronary artery disease.  He is also here for preoperative evaluation prior to lumber steroid injection . He is on plavix 75 mg a day .  He had stenting of his mid LAD in Aug. 2018 myoview from Sept. 2020 reveals old ant Mi.  No ischemia  Having some chest pain for the past several months  Seems to be getting worse , needs to have his NTG refilled . Seems to radiate to his left sholder  Lots of dyspnea  Fatigue  Avoiding salt .    Past Medical History:  Diagnosis Date  . Adenomatous colon polyp   . Arthritis   . CAD (coronary artery disease)    a. MI in 1996 with stent placement b. cath 01/18/17 - S/p PCI of in-stent restenosis of pLAD with cutting ballon & DES; 20% ostial LAD; 40% pro Cx; 60% focal pRCA  . Clostridium difficile infection   . Depression   . Diverticulosis   . H. pylori infection   . HTN (hypertension)   . Hyperlipidemia   . MI (myocardial infarction) [facility])     Past Surgical History:  Procedure Laterality Date  . BRAIN TUMOR EXCISION  1954   benign tumor in back of head, done at  [facility]  . CARDIAC CATHETERIZATION    . COLONOSCOPY     hx of polyps  . [place]; 1997; 01/18/2017  . CORONARY STENT INTERVENTION N/A 01/18/2017   Procedure: CORONARY STENT INTERVENTION;  Surgeon: Troy Sine, MD;  Location: [facility];  Service: Cardiovascular;  Laterality: N/A;  .  ESOPHAGOGASTRODUODENOSCOPY (EGD) WITH PROPOFOL N/A 06/14/2018   Procedure: ESOPHAGOGASTRODUODENOSCOPY (EGD) WITH PROPOFOL;  Surgeon: Gatha Mayer, MD;  Location: [facility];  Service: Endoscopy;  Laterality: N/A;  . JOINT REPLACEMENT    . LEFT HEART CATH AND CORONARY ANGIOGRAPHY N/A 01/18/2017   Procedure: LEFT HEART CATH AND CORONARY ANGIOGRAPHY;  Surgeon: Troy Sine, MD;  Location: [place] [facility];  Service: Cardiovascular;  Laterality: N/A;  . stent x2 in the LAD  with intra-aortic balloon pump support  1996  . TONSILLECTOMY    . TOTAL KNEE ARTHROPLASTY Bilateral     Current Medications: Current Meds  Medication Sig  . atorvastatin (LIPITOR) 80 MG tablet Take 80 mg by mouth daily.  . clopidogrel (PLAVIX) 75 MG tablet TAKE 1 TABLET BY MOUTH EVERY DAY  . escitalopram (LEXAPRO) 20 MG tablet Take 20 mg by mouth daily.  Marland Kitchen esomeprazole (NEXIUM) 40 MG capsule Take 40 mg by mouth daily at 12 noon.  . fenofibrate (TRICOR) 145 MG tablet TAKE 1 TABLET BY MOUTH EVERY DAY  . furosemide (LASIX) 20 MG tablet Take 20 mg by mouth daily as needed for fluid or edema.  . gabapentin (NEURONTIN) 300 MG capsule Take 300 mg by mouth daily.  Marland Kitchen HYDROcodone-acetaminophen (NORCO) 7.5-325 MG tablet Take 1 tablet by mouth daily as needed. For pain.  . meloxicam (MOBIC) 7.5 MG tablet Take 7.5 mg by mouth daily.  . metoprolol succinate (TOPROL-XL) 50 MG 24 hr tablet Take 50 mg by mouth daily.  . [DISCONTINUED] ezetimibe (ZETIA) 10 MG tablet TAKE 1 TABLET BY MOUTH EVERY DAY  . [DISCONTINUED] nitroGLYCERIN (NITROSTAT) 0.4 MG SL tablet Place 1 tablet (0.4 mg total) under the tongue every 5 (five) minutes as needed for chest pain.  . [DISCONTINUED] tiZANidine (ZANAFLEX) 4 MG tablet Take 1 tablet by mouth every 8 (eight) hours as needed for muscle spasms.      Allergies:   Patient has no known allergies.   Social History   Socioeconomic History  . Marital status: Married    Spouse name: Not on file  .  Number of children: 1  . Years of education: Not on file  . Highest education level: Not on file  Occupational History  . Occupation: Retired  Tobacco Use  . Smoking status: Former Smoker    Quit date: 06/06/1994    Years since quitting: 26.1  . Smokeless tobacco: Never Used  Vaping Use  . Vaping Use: Never used  Substance and Sexual Activity  . Alcohol use: No  . Drug use: No  . Sexual activity: Yes  Other Topics Concern  . Not on file  Social History Narrative  . Not on file   Social Determinants of Health   Financial Resource Strain: Not on file  Food Insecurity: Not on file  Transportation Needs: Not on file  Physical Activity: Not on file  Stress: Not on file  Social Connections: Not on file     Family History: The patient's family history includes Cancer in his sister; Heart attack in his mother; Heart attack (age of onset: 18) in his brother. There is no history of Colon cancer, Stomach cancer, Rectal cancer, or Esophageal cancer. ROS:   Please see the history of present illness.     All other systems reviewed and are negative.  EKGs/Labs/Other Studies Reviewed:     Physical Exam: Blood pressure 106/82, pulse (!) 57, height 5\' 9"  (1.753 m), weight 230 lb (104.3 kg), SpO2 94 %.  GEN:  Well nourished, well developed in no acute distress HEENT: Normal NECK: No JVD; No carotid bruits LYMPHATICS: No lymphadenopathy CARDIAC: RRR  RESPIRATORY:   Rales in both bases, L > R  ABDOMEN: Soft, non-tender, non-distended MUSCULOSKELETAL:  No edema; No deformity  SKIN: Warm and dry NEUROLOGIC:  Alert and oriented x 3    EKG:    Feb. 18, 2022 Sinus brady , LBBB     Recent Labs: No results found for requested labs within last 8760 hours.  Recent Lipid Panel    Component Value Date/Time   CHOL 123 05/06/2019 1008   TRIG 84 05/06/2019 1008   HDL 44 05/06/2019 1008   CHOLHDL 2.8 05/06/2019 1008   CHOLHDL 4 06/23/2009 1147   VLDL 23.4 06/23/2009 1147   LDLCALC  63 05/06/2019 1008   LDLDIRECT 159.1 12/10/2007 0851      ASSESSMENT: PLAN:     1. Coronary artery disease:    He is having worseiing angina and I think he needs a cath .   He also has rales in both bases  - denies fever,   Has slightl cough in the am.  Is already on lasix  Will get a PA and lat CXR prior to cath ,  CBC with diff.   We have discussed risks, benefits, options of cath .  He understands and agrees to proceed.     2.  Chronic diastolic congestive heart failure.    stable    3.  Hyperlipidemia:   Lipids are wll controlled.    4.  Possible claudication:   5.   He needs to have a steroid injection into his back .  Will need to wait and clear him after we see how the cath turns out .    Medication Adjustments/Labs and Tests Ordered: Current medicines are reviewed at length with the patient today.  Concerns regarding medicines are outlined above.  Orders Placed This Encounter  Procedures  . DG Chest 2 View  . Basic metabolic panel  . CBC w/Diff     Meds ordered this encounter  Medications  . nitroGLYCERIN (NITROSTAT) 0.4 MG SL tablet    Sig: Place 1 tablet (0.4 mg total) under the tongue every 5 (five) minutes as needed for chest pain.    Dispense:  25 tablet    Refill:  12    Signed, [name], MD  07/24/2020 6:14 PM    [facility]

## 2020-07-25 ENCOUNTER — Other Ambulatory Visit (HOSPITAL_COMMUNITY)
Admission: RE | Admit: 2020-07-25 | Discharge: 2020-07-25 | Disposition: A | Discharge status: Home / Self Care | Payer: Medicare Other | Source: Ambulatory Visit | Attending: Cardiology | Admitting: Cardiology

## 2020-07-25 DIAGNOSIS — Z20822 Contact with and (suspected) exposure to covid-19: Secondary | ICD-10-CM | POA: Diagnosis not present

## 2020-07-25 DIAGNOSIS — Z01812 Encounter for preprocedural laboratory examination: Secondary | ICD-10-CM | POA: Insufficient documentation

## 2020-07-25 LAB — CBC WITH DIFFERENTIAL/PLATELET
Basophils Absolute: 0.1 10*3/uL (ref 0.0–0.2)
Basos: 1 %
EOS (ABSOLUTE): 0.2 10*3/uL (ref 0.0–0.4)
Eos: 3 %
Hematocrit: 45.6 % (ref 37.5–51.0)
Hemoglobin: 15.4 g/dL (ref 13.0–17.7)
Immature Grans (Abs): 0 10*3/uL (ref 0.0–0.1)
Immature Granulocytes: 0 %
Lymphocytes Absolute: 1.7 10*3/uL (ref 0.7–3.1)
Lymphs: 27 %
MCH: 30 pg (ref 26.6–33.0)
MCHC: 33.8 g/dL (ref 31.5–35.7)
MCV: 89 fL (ref 79–97)
Monocytes Absolute: 0.8 10*3/uL (ref 0.1–0.9)
Monocytes: 12 %
Neutrophils Absolute: 3.5 10*3/uL (ref 1.4–7.0)
Neutrophils: 57 %
Platelets: 260 10*3/uL (ref 150–450)
RBC: 5.13 x10E6/uL (ref 4.14–5.80)
RDW: 13.5 % (ref 11.6–15.4)
WBC: 6.2 10*3/uL (ref 3.4–10.8)

## 2020-07-25 LAB — BASIC METABOLIC PANEL
BUN/Creatinine Ratio: 18 (ref 10–24)
BUN: 22 mg/dL (ref 8–27)
CO2: 23 mmol/L (ref 20–29)
Calcium: 9.9 mg/dL (ref 8.6–10.2)
Chloride: 100 mmol/L (ref 96–106)
Creatinine, Ser: 1.19 mg/dL (ref 0.76–1.27)
GFR calc Af Amer: 67 mL/min/{1.73_m2} (ref >59)
GFR calc non Af Amer: 58 mL/min/{1.73_m2} — ABNORMAL LOW (ref >59)
Glucose: 97 mg/dL (ref 65–99)
Potassium: 4.6 mmol/L (ref 3.5–5.2)
Sodium: 139 mmol/L (ref 134–144)

## 2020-07-25 LAB — SARS CORONAVIRUS 2 (TAT 6-24 HRS): SARS Coronavirus 2: NEGATIVE

## 2020-07-27 ENCOUNTER — Telehealth: Payer: Self-pay | Admitting: *Deleted

## 2020-07-27 NOTE — Telephone Encounter (Signed)
Pt contacted pre-catheterization scheduled at [facility] for: Tuesday July 28, 2020 1PM Verified arrival time and place: [facility]) at: 8 AM-pre-procedure hydration per Dr Acie Fredrickson   No solid food after midnight prior to cath, clear liquids until 5 AM day of procedure.  Hold: Mobic-AM of procedure  Except hold medications AM meds can be  taken pre-cath with sips of water including: ASA 81 mg Plavix 75 mg  Confirmed patient has responsible adult to drive home post procedure and be with patient first 24 hours after arriving home: yes  You are allowed ONE visitor in the waiting room during the time you are at the hospital for your procedure. Both you and your visitor must wear a mask once you enter the hospital.   Reviewed procedure/mask/visitor instructions with patient.

## 2020-07-28 ENCOUNTER — Other Ambulatory Visit: Payer: Self-pay

## 2020-07-28 ENCOUNTER — Ambulatory Visit (HOSPITAL_COMMUNITY)
Admission: RE | Admit: 2020-07-28 | Discharge: 2020-07-28 | Disposition: A | Discharge status: Home / Self Care | Payer: Medicare Other | Attending: Cardiology | Admitting: Cardiology

## 2020-07-28 ENCOUNTER — Other Ambulatory Visit (HOSPITAL_COMMUNITY): Payer: Medicare Other

## 2020-07-28 ENCOUNTER — Encounter (HOSPITAL_COMMUNITY)
Admission: RE | Disposition: A | Discharge status: Home / Self Care | Payer: Self-pay | Source: Home / Self Care | Attending: Cardiology

## 2020-07-28 DIAGNOSIS — I25110 Atherosclerotic heart disease of native coronary artery with unstable angina pectoris: Secondary | ICD-10-CM | POA: Diagnosis not present

## 2020-07-28 DIAGNOSIS — E785 Hyperlipidemia, unspecified: Secondary | ICD-10-CM | POA: Insufficient documentation

## 2020-07-28 DIAGNOSIS — Z955 Presence of coronary angioplasty implant and graft: Secondary | ICD-10-CM | POA: Diagnosis not present

## 2020-07-28 DIAGNOSIS — I5032 Chronic diastolic (congestive) heart failure: Secondary | ICD-10-CM | POA: Insufficient documentation

## 2020-07-28 DIAGNOSIS — Z87891 Personal history of nicotine dependence: Secondary | ICD-10-CM | POA: Insufficient documentation

## 2020-07-28 DIAGNOSIS — Z7902 Long term (current) use of antithrombotics/antiplatelets: Secondary | ICD-10-CM | POA: Diagnosis not present

## 2020-07-28 DIAGNOSIS — I110 Hypertensive heart disease with heart failure: Secondary | ICD-10-CM | POA: Diagnosis not present

## 2020-07-28 DIAGNOSIS — I25118 Atherosclerotic heart disease of native coronary artery with other forms of angina pectoris: Secondary | ICD-10-CM | POA: Diagnosis not present

## 2020-07-28 DIAGNOSIS — I252 Old myocardial infarction: Secondary | ICD-10-CM

## 2020-07-28 DIAGNOSIS — I200 Unstable angina: Secondary | ICD-10-CM

## 2020-07-28 DIAGNOSIS — Z79899 Other long term (current) drug therapy: Secondary | ICD-10-CM | POA: Diagnosis not present

## 2020-07-28 HISTORY — PX: LEFT HEART CATH AND CORONARY ANGIOGRAPHY: CATH118249

## 2020-07-28 HISTORY — PX: INTRAVASCULAR PRESSURE WIRE/FFR STUDY: CATH118243

## 2020-07-28 LAB — POCT ACTIVATED CLOTTING TIME
Activated Clotting Time: 243 seconds
Activated Clotting Time: 309 seconds

## 2020-07-28 SURGERY — LEFT HEART CATH AND CORONARY ANGIOGRAPHY
Anesthesia: LOCAL

## 2020-07-28 MED ORDER — ACETAMINOPHEN 325 MG PO TABS: 650.0000 mg | ORAL_TABLET | ORAL | Status: DC | PRN

## 2020-07-28 MED ORDER — SODIUM CHLORIDE 0.9 % IV SOLN: 250.0000 mL | INTRAVENOUS | Status: DC | PRN

## 2020-07-28 MED ORDER — MIDAZOLAM HCL 2 MG/2ML IJ SOLN
INTRAMUSCULAR | Status: AC
  Filled 2020-07-28: qty 2

## 2020-07-28 MED ORDER — LABETALOL HCL 5 MG/ML IV SOLN: 10.0000 mg | INTRAVENOUS | Status: DC | PRN

## 2020-07-28 MED ORDER — HEPARIN SODIUM (PORCINE) 1000 UNIT/ML IJ SOLN
INTRAMUSCULAR | Status: DC | PRN
  Administered 2020-07-28: 3000 [IU] via INTRAVENOUS
  Administered 2020-07-28: 5500 [IU] via INTRAVENOUS
  Administered 2020-07-28: 5000 [IU] via INTRAVENOUS

## 2020-07-28 MED ORDER — LIDOCAINE HCL (PF) 1 % IJ SOLN
INTRAMUSCULAR | Status: DC | PRN
  Administered 2020-07-28: 2 mL via INTRADERMAL

## 2020-07-28 MED ORDER — HYDRALAZINE HCL 20 MG/ML IJ SOLN: 10.0000 mg | INTRAMUSCULAR | Status: DC | PRN

## 2020-07-28 MED ORDER — SODIUM CHLORIDE 0.9 % WEIGHT BASED INFUSION: 1.0000 mL/kg/h | INTRAVENOUS | Status: DC

## 2020-07-28 MED ORDER — SODIUM CHLORIDE 0.9 % WEIGHT BASED INFUSION
3.0000 mL/kg/h | INTRAVENOUS | Status: AC
  Administered 2020-07-28: 3 mL/kg/h via INTRAVENOUS

## 2020-07-28 MED ORDER — HEPARIN (PORCINE) IN NACL 1000-0.9 UT/500ML-% IV SOLN
INTRAVENOUS | Status: DC | PRN
  Administered 2020-07-28 (×2): 500 mL

## 2020-07-28 MED ORDER — FENTANYL CITRATE (PF) 100 MCG/2ML IJ SOLN
INTRAMUSCULAR | Status: AC
  Filled 2020-07-28: qty 2

## 2020-07-28 MED ORDER — NITROGLYCERIN 1 MG/10 ML FOR IR/CATH LAB
INTRA_ARTERIAL | Status: AC
  Filled 2020-07-28: qty 10

## 2020-07-28 MED ORDER — IOHEXOL 350 MG/ML SOLN
INTRAVENOUS | Status: DC | PRN
  Administered 2020-07-28: 135 mL

## 2020-07-28 MED ORDER — VERAPAMIL HCL 2.5 MG/ML IV SOLN
INTRAVENOUS | Status: AC
  Filled 2020-07-28: qty 2

## 2020-07-28 MED ORDER — VERAPAMIL HCL 2.5 MG/ML IV SOLN
INTRAVENOUS | Status: DC | PRN
  Administered 2020-07-28: 10 mL via INTRA_ARTERIAL

## 2020-07-28 MED ORDER — MIDAZOLAM HCL 2 MG/2ML IJ SOLN
INTRAMUSCULAR | Status: DC | PRN
  Administered 2020-07-28: 2 mg via INTRAVENOUS

## 2020-07-28 MED ORDER — LIDOCAINE HCL (PF) 1 % IJ SOLN
INTRAMUSCULAR | Status: AC
  Filled 2020-07-28: qty 30

## 2020-07-28 MED ORDER — SODIUM CHLORIDE 0.9% FLUSH: 3.0000 mL | Freq: Two times a day (BID) | INTRAVENOUS | Status: DC

## 2020-07-28 MED ORDER — CLOPIDOGREL BISULFATE 75 MG PO TABS
75.0000 mg | ORAL_TABLET | Freq: Once | ORAL | Status: AC
  Administered 2020-07-28: 75 mg via ORAL
  Filled 2020-07-28: qty 1

## 2020-07-28 MED ORDER — ISOSORBIDE MONONITRATE ER 30 MG PO TB24: 30.0000 mg | ORAL_TABLET | Freq: Every day | ORAL | 3 refills | Status: DC

## 2020-07-28 MED ORDER — ONDANSETRON HCL 4 MG/2ML IJ SOLN: 4.0000 mg | Freq: Four times a day (QID) | INTRAMUSCULAR | Status: DC | PRN

## 2020-07-28 MED ORDER — NITROGLYCERIN 1 MG/10 ML FOR IR/CATH LAB
INTRA_ARTERIAL | Status: DC | PRN
  Administered 2020-07-28: 200 ug

## 2020-07-28 MED ORDER — FENTANYL CITRATE (PF) 100 MCG/2ML IJ SOLN
INTRAMUSCULAR | Status: DC | PRN
  Administered 2020-07-28: 25 ug via INTRAVENOUS

## 2020-07-28 MED ORDER — ASPIRIN 81 MG PO CHEW
81.0000 mg | CHEWABLE_TABLET | ORAL | Status: AC
  Administered 2020-07-28: 81 mg via ORAL
  Filled 2020-07-28: qty 1

## 2020-07-28 MED ORDER — HEPARIN SODIUM (PORCINE) 1000 UNIT/ML IJ SOLN
INTRAMUSCULAR | Status: AC
  Filled 2020-07-28: qty 1

## 2020-07-28 MED ORDER — SODIUM CHLORIDE 0.9% FLUSH
3.0000 mL | INTRAVENOUS | Status: DC | PRN
Start: 2020-07-28 — End: 2020-07-28

## 2020-07-28 MED ORDER — SODIUM CHLORIDE 0.9% FLUSH: 3.0000 mL | INTRAVENOUS | Status: DC | PRN

## 2020-07-28 MED ORDER — SODIUM CHLORIDE 0.9 % IV SOLN: INTRAVENOUS | Status: AC

## 2020-07-28 MED ORDER — HEPARIN (PORCINE) IN NACL 1000-0.9 UT/500ML-% IV SOLN
INTRAVENOUS | Status: AC
  Filled 2020-07-28: qty 1000

## 2020-07-28 SURGICAL SUPPLY — 14 items
CATH OPTITORQUE TIG 4.0 5F (CATHETERS) ×2 IMPLANT
CATH VISTA GUIDE 6FR JR4 (CATHETERS) ×2 IMPLANT
CATH VISTA GUIDE 6FR XB3.5 (CATHETERS) ×2 IMPLANT
DEVICE RAD COMP TR BAND LRG (VASCULAR PRODUCTS) ×2 IMPLANT
GLIDESHEATH SLEND SS 6F .021 (SHEATH) ×2 IMPLANT
GUIDEWIRE INQWIRE 1.5J.035X260 (WIRE) ×1 IMPLANT
GUIDEWIRE PRESSURE X 175 (WIRE) ×2 IMPLANT
INQWIRE 1.5J .035X260CM (WIRE) ×2
KIT ESSENTIALS PG (KITS) ×2 IMPLANT
KIT HEART LEFT (KITS) ×2 IMPLANT
PACK CARDIAC CATHETERIZATION (CUSTOM PROCEDURE TRAY) ×2 IMPLANT
SHEATH PROBE COVER 6X72 (BAG) ×2 IMPLANT
TRANSDUCER W/STOPCOCK (MISCELLANEOUS) ×2 IMPLANT
TUBING CIL FLEX 10 FLL-RA (TUBING) ×2 IMPLANT

## 2020-07-28 NOTE — Progress Notes (Signed)
1239 Received order post cath. Pt did not have PCI so will not see pt at this time. Graylon Good RN BSN 07/28/2020 12:47 PM

## 2020-07-28 NOTE — Discharge Instructions (Signed)
Radial Site Care  This sheet gives you information about how to care for yourself after your procedure. Your health care provider may also give you more specific instructions. If you have problems or questions, contact your health care provider. What can I expect after the procedure? After the procedure, it is common to have:  Bruising and tenderness at the catheter insertion area. Follow these instructions at home: Medicines  Take over-the-counter and prescription medicines only as told by your health care provider. Insertion site care 1. Follow instructions from your health care provider about how to take care of your insertion site. Make sure you: ? Wash your hands with soap and water before you remove your bandage (dressing). If soap and water are not available, use hand sanitizer. ? May remove dressing in 24 hours. 2. Check your insertion site every day for signs of infection. Check for: ? Redness, swelling, or pain. ? Fluid or blood. ? Pus or a bad smell. ? Warmth. 3. Do no take baths, swim, or use a hot tub for 5 days. 4. You may shower 24-48 hours after the procedure. ? Remove the dressing and gently wash the site with plain soap and water. ? Pat the area dry with a clean towel. ? Do not rub the site. That could cause bleeding. 5. Do not apply powder or lotion to the site. Activity  1. For 24 hours after the procedure, or as directed by your health care provider: ? Do not flex or bend the affected arm. ? Do not push or pull heavy objects with the affected arm. ? Do not drive yourself home from the hospital or clinic. You may drive 24 hours after the procedure. ? Do not operate machinery or power tools. ? KEEP ARM ELEVATED THE REMAINDER OF THE DAY. 2. Do not push, pull or lift anything that is heavier than 10 lb for 5 days. 3. Ask your health care provider when it is okay to: ? Return to work or school. ? Resume usual physical activities or sports. ? Resume sexual  activity. General instructions  If the catheter site starts to bleed, raise your arm and put firm pressure on the site. If the bleeding does not stop, get help right away. This is a medical emergency.  DRINK PLENTY OF FLUIDS FOR THE NEXT 2-3 DAYS.  No alcohol consumption for 24 hours after receiving sedation.  If you went home on the same day as your procedure, a responsible adult should be with you for the first 24 hours after you arrive home.  Keep all follow-up visits as told by your health care provider. This is important. Contact a health care provider if:  You have a fever.  You have redness, swelling, or yellow drainage around your insertion site. Get help right away if:  You have unusual pain at the radial site.  The catheter insertion area swells very fast.  The insertion area is bleeding, and the bleeding does not stop when you hold steady pressure on the area.  Your arm or hand becomes pale, cool, tingly, or numb. These symptoms may represent a serious problem that is an emergency. Do not wait to see if the symptoms will go away. Get medical help right away. Call your local emergency services (911 in the [place] Do not drive yourself to the hospital. Summary  After the procedure, it is common to have bruising and tenderness at the site.  Follow instructions from your health care provider about how to take care   of your radial site wound. Check the wound every day for signs of infection.  This information is not intended to replace advice given to you by your health care provider. Make sure you discuss any questions you have with your health care provider. Document Revised: 06/28/2017 Document Reviewed: 06/28/2017 Elsevier Patient Education  2020 Elsevier Inc. 

## 2020-07-28 NOTE — Interval H&P Note (Signed)
History and Physical Interval Note:  07/28/2020 10:33 AM  [name]  has presented today for surgery, with the diagnosis of unstable angina.  The various methods of treatment have been discussed with the patient and family. After consideration of risks, benefits and other options for treatment, the patient has consented to  Procedure(s): LEFT HEART CATH AND CORONARY ANGIOGRAPHY (N/A)  PERCUTANEOUS CORONARY INTERVENTION  as a surgical intervention.  The patient's history has been reviewed, patient examined, no change in status, stable for surgery.  I have reviewed the patient's chart and labs.  Questions were answered to the patient's satisfaction.    Cath Lab Visit (complete for each Cath Lab visit)  Clinical Evaluation Leading to the Procedure:   ACS: No.  Non-ACS:    Anginal Classification: CCS III  Anti-ischemic medical therapy: Minimal Therapy (1 class of medications)  Non-Invasive Test Results: No non-invasive testing performed  Prior CABG: No previous CABG   Glenetta Hew

## 2020-07-29 ENCOUNTER — Encounter (HOSPITAL_COMMUNITY): Payer: Self-pay | Admitting: Cardiology

## 2020-07-29 NOTE — Telephone Encounter (Signed)
    Pt has worsening DOE Cath yesterday reveals mild - moderate CAD but no critical lesions that would require stenting  Cath reveals mild LV dysfunction  He is still having DOE.  We will get an echo to further define his cardiac function but he may proceed with his back injection regardless of the echo results.   He is at low risk for his upcoming spine injection  He may stop the plavix for 5 days prior to the injection  And restart as soon as possible after the procedure.    [name], MD  07/29/2020 5:26 PM    [address],  [place], [place]  [address] Phone: [phone]; Fax: [phone]

## 2020-07-29 NOTE — Addendum Note (Signed)
Addended by: Georgiann Cocker on: 07/29/2020 11:03 AM   Modules accepted: Orders

## 2020-07-30 ENCOUNTER — Telehealth: Payer: Self-pay | Admitting: Nurse Practitioner

## 2020-07-30 DIAGNOSIS — I25118 Atherosclerotic heart disease of native coronary artery with other forms of angina pectoris: Secondary | ICD-10-CM

## 2020-07-30 DIAGNOSIS — R0600 Dyspnea, unspecified: Secondary | ICD-10-CM

## 2020-07-30 DIAGNOSIS — R0609 Other forms of dyspnea: Secondary | ICD-10-CM

## 2020-07-30 DIAGNOSIS — I5022 Chronic systolic (congestive) heart failure: Secondary | ICD-10-CM

## 2020-07-30 NOTE — Telephone Encounter (Signed)
-----   Message from [name], MD sent at 07/29/2020  5:26 PM EST ----- Please order an echo from [name] to evaluate his continued DOE Cath revealed mild - mod disease without any significant stenosis that would require stenting .  [name]

## 2020-07-30 NOTE — Telephone Encounter (Signed)
Called patient to schedule echo. He states he continues to have DOE, denies weight gain or edema. He states he has purposefully lost 3 lbs recently. I scheduled echo for 3/21 and advised him to call back prior to that time if his symptoms worsen or if he has questions or concerns. He verbalized understanding and agreement and thanked me for the call.

## 2020-08-03 DIAGNOSIS — E1122 Type 2 diabetes mellitus with diabetic chronic kidney disease: Secondary | ICD-10-CM | POA: Diagnosis not present

## 2020-08-03 DIAGNOSIS — I130 Hypertensive heart and chronic kidney disease with heart failure and stage 1 through stage 4 chronic kidney disease, or unspecified chronic kidney disease: Secondary | ICD-10-CM | POA: Diagnosis not present

## 2020-08-03 DIAGNOSIS — I5022 Chronic systolic (congestive) heart failure: Secondary | ICD-10-CM | POA: Diagnosis not present

## 2020-08-03 DIAGNOSIS — N1831 Chronic kidney disease, stage 3a: Secondary | ICD-10-CM | POA: Diagnosis not present

## 2020-08-03 NOTE — Telephone Encounter (Signed)
Notes faxed to surgeon. This phone note will be removed from the preop pool. [name], PA-C  08/03/2020 8:07 AM

## 2020-08-06 DIAGNOSIS — M5416 Radiculopathy, lumbar region: Secondary | ICD-10-CM | POA: Diagnosis not present

## 2020-08-18 ENCOUNTER — Telehealth: Payer: Self-pay | Admitting: Cardiovascular Disease

## 2020-08-18 NOTE — Telephone Encounter (Signed)
RN spoke with Dolphus Jenny, Pharmacist with [facility] who was calling to confirm patients current medication and upcoming appointments. Dolphus Jenny states that they had a different medication list compared to what the patient was taking. RN compared medication to most recent office notes and current medication list. Dolphus Jenny states that she would like to make a note in the upcoming appt notes to review medications and compliance with patient. Dolphus Jenny also wanted to confirm upcoming appointments to remind the patient. Note added to appt note. Dolphus Jenny thanked Therapist, sports for returning call.

## 2020-08-18 NOTE — Telephone Encounter (Signed)
Pt c/o medication issue:  1. Name of Medication: metoprolol (LOPRESSOR) 50 MG tablet [[id number]] furosemide (LASIX) 20 MG tablet [[id number]]  isosorbide mononitrate (IMDUR) 30 MG 24 hr tablet 2. How are you currently taking this medication (dosage and times per day)? Not currently taking metoprolol or isosorbide mononitrate   3. Are you having a reaction (difficulty breathing--STAT)? No   4. What is your medication issue? [name] is calling stating she saw [name] today and he was confused on which heart medications he is and is not supposed to be taking. She states he is currently taking Lasix, but neither of the other medications listed. [name] is requesting a callback to confirm which meds Dr. Acie Fredrickson wants him taking so she can update her records and inform the pt. Please advise.

## 2020-08-24 ENCOUNTER — Ambulatory Visit (HOSPITAL_COMMUNITY): Payer: Medicare Other | Attending: Cardiovascular Disease

## 2020-08-24 ENCOUNTER — Other Ambulatory Visit: Payer: Self-pay

## 2020-08-24 DIAGNOSIS — R0600 Dyspnea, unspecified: Secondary | ICD-10-CM | POA: Diagnosis not present

## 2020-08-24 DIAGNOSIS — I25118 Atherosclerotic heart disease of native coronary artery with other forms of angina pectoris: Secondary | ICD-10-CM | POA: Diagnosis not present

## 2020-08-24 DIAGNOSIS — I5022 Chronic systolic (congestive) heart failure: Secondary | ICD-10-CM | POA: Diagnosis not present

## 2020-08-24 DIAGNOSIS — R0609 Other forms of dyspnea: Secondary | ICD-10-CM

## 2020-08-24 LAB — ECHOCARDIOGRAM COMPLETE
Area-P 1/2: 2.46 cm2
S' Lateral: 3.8 cm

## 2020-08-31 ENCOUNTER — Telehealth: Payer: Self-pay | Admitting: Cardiovascular Disease

## 2020-08-31 NOTE — Telephone Encounter (Signed)
Patient was calling in to get results from the procedure that was done

## 2020-08-31 NOTE — Telephone Encounter (Signed)
Patient returning call regarding echocardiogram results. RN reviewed results and answered questions appropriately. RN encouraged patient to contact the office with any questions or concerns.

## 2020-09-03 DIAGNOSIS — I130 Hypertensive heart and chronic kidney disease with heart failure and stage 1 through stage 4 chronic kidney disease, or unspecified chronic kidney disease: Secondary | ICD-10-CM | POA: Diagnosis not present

## 2020-09-03 DIAGNOSIS — E1122 Type 2 diabetes mellitus with diabetic chronic kidney disease: Secondary | ICD-10-CM | POA: Diagnosis not present

## 2020-09-03 DIAGNOSIS — I5022 Chronic systolic (congestive) heart failure: Secondary | ICD-10-CM | POA: Diagnosis not present

## 2020-09-03 DIAGNOSIS — N1831 Chronic kidney disease, stage 3a: Secondary | ICD-10-CM | POA: Diagnosis not present

## 2020-09-16 ENCOUNTER — Encounter: Payer: Self-pay | Admitting: Physician Assistant

## 2020-09-16 ENCOUNTER — Other Ambulatory Visit: Payer: Self-pay

## 2020-09-16 ENCOUNTER — Ambulatory Visit (INDEPENDENT_AMBULATORY_CARE_PROVIDER_SITE_OTHER): Payer: Medicare Other | Admitting: Physician Assistant

## 2020-09-16 VITALS — BP 108/64 | HR 74 | Ht 69.0 in | Wt 228.2 lb

## 2020-09-16 DIAGNOSIS — I5032 Chronic diastolic (congestive) heart failure: Secondary | ICD-10-CM | POA: Diagnosis not present

## 2020-09-16 DIAGNOSIS — I10 Essential (primary) hypertension: Secondary | ICD-10-CM | POA: Diagnosis not present

## 2020-09-16 DIAGNOSIS — I25118 Atherosclerotic heart disease of native coronary artery with other forms of angina pectoris: Secondary | ICD-10-CM

## 2020-09-16 DIAGNOSIS — E785 Hyperlipidemia, unspecified: Secondary | ICD-10-CM

## 2020-09-16 MED ORDER — FUROSEMIDE 20 MG PO TABS: 20.0000 mg | ORAL_TABLET | Freq: Every day | ORAL | 11 refills | Status: DC

## 2020-09-16 MED ORDER — ISOSORBIDE MONONITRATE ER 30 MG PO TB24: 15.0000 mg | ORAL_TABLET | Freq: Every day | ORAL | 3 refills | Status: DC

## 2020-09-16 NOTE — Patient Instructions (Signed)
Medication Instructions:  Your physician has recommended you make the following change in your medication:   1.  Decrease imdur one half tablet by mouth ( 15 mg) daily. 2.  Start Lasix one tablet by mouth ( 20 mg) daily.   *If you need a refill on your cardiac medications before your next appointment, please call your pharmacy*   Lab Work: Your physician recommends that you return for lab work on Thursday, April 21 between 7:30 - 4:30.   If you have labs (blood work) drawn today and your tests are completely normal, you will receive your results only by: Marland Kitchen MyChart Message (if you have MyChart) OR . A paper copy in the mail If you have any lab test that is abnormal or we need to change your treatment, we will call you to review the results.   Testing/Procedures: -None   Follow-Up: At [facility], you and your health needs are our priority.  As part of our continuing mission to provide you with exceptional heart care, we have created designated Provider Care Teams.  These Care Teams include your primary Cardiologist (physician) and Advanced Practice Providers (APPs -  Physician Assistants and Nurse Practitioners) who all work together to provide you with the care you need, when you need it.  We recommend signing up for the patient portal called "MyChart".  Sign up information is provided on this After Visit Summary.  MyChart is used to connect with patients for Virtual Visits (Telemedicine).  Patients are able to view lab/test results, encounter notes, upcoming appointments, etc.  Non-urgent messages can be sent to your provider as well.   To learn more about what you can do with MyChart, go to [email or web address].    Your next appointment:   4 week(s) on Friday, May 13 @ 11:45 am.   The format for your next appointment:   In Person  Provider:   [name], PA-C   Other Instructions -None

## 2020-09-16 NOTE — Progress Notes (Signed)
Cardiology Office Note:    Date:  09/16/2020   ID:  [name], DOB 21-Jan-1941, MRN [id number]  PCP:  [name], [place]  Cardiologist:  [name], MD   Electrophysiologist:  None       Referring MD: [name], MD   Chief Complaint:  Hospitalization Follow-up (S/p cath)    Patient Profile:     [name] is a 80 y.o. male with:   Coronary artery disease   S/p MI in 1996 tx with stent  S/p DES to LAD in 2018  Cath 2/22: LAD stent patent; mod non-obs dz in LCx, RCA, dLAD (neg RFR) >> Med Rx   (HFpEF) heart failure with preserved ejection fraction   Echocardiogram 3/22: EF 55-60  Hx of GI bleed  Hypertension   Hyperlipidemia   Prior CV studies: Echocardiogram 08/24/2020 EF 55-60, no RWMA, GR 1 DD, normal RVSF, moderate LAE, trivial MR, AV sclerosis without stenosis  LEFT HEART CATH  07/28/2020 Narrative  There is mild left ventricular systolic dysfunction.  LV end diastolic pressure is moderately elevated.  The left ventricular ejection fraction is 45-50% by visual estimate.  --------------------------------  3 sites evaluated with RFR/IFR (abnormal will be less than 0.88)  Ost LAD to Prox LAD lesion is 30% stenosed.  Prox LAD to Mid LAD - stent within stent is 10% stenosed. - RFR of ostial lesion - stented section = 0.96  Mid LAD to Dist LAD lesion is 45% stenosed. Dist LAD-1 lesion is 55% stenosed. -> RFR 0.92  Dist LAD-2 lesion the distal LAD tapers to roughly 50% of the more upstream vessel. No obvious lesion.  Prox Cx lesion is 55% stenosed. RFR 0.96-0.98  Prox RCA lesion is 60% stenosed. RFR 0.97  Mid RCA to Dist RCA lesion is 15% stenosed.  SUMMARY  Moderate three-vessel disease-nonflow limiting via RFR:  30% ostial proximal LAD prior to long stented segment with minimal ISR, distal LAD after major diagonal branch tapers to a relatively small caliber vessel (<1.5 mm) -> RFR at  this point was 0.92-not significant;  proximal LCx eccentric 55% (RFR 0.96-0.98);  proximal RCA eccentric/irregular 60% (RFR 0.97)  Mildly reduced LVEF (~EF 45%) with anterior hypokinesis, and moderately elevated LVEDP.  Consider microvascular disease with very small caliber distal LAD       GATED SPECT MYO PERF W/LEXISCAN STRESS 1D 02/25/2019 Narrative Fixed defect at apex, extending to distal anterior/inferior walls, consistent with infarct. No reversibility seen (and actually mild improvement in inferior walls with stress). Borderline low LV EF (51) with hypokinesis in same walls as perfusion defect. Intermediate risk for infarct but no appreciable ischemia.       History of Present Illness:    [name] was last seen by Dr. Acie Fredrickson in 07/2020.  He was having chest pain and dyspnea on exertion which was concerning for unstable angina.  He was set up for Cardiac catheterization 07/28/2020 which demonstrated a patent stent in the LAD.  There was moderate disease in the distal LAD, proximal LCx and proximal RCA.  There were no hemodynamically significant lesions by RFR.  Med Rx was recommended.  EF is mildly reduced at 45.  Echocardiogram 08/24/2020 demonstrated normal EF.  He returns for f/u.    He is here alone.  He continues have shortness of breath with exertion.  He also has occasional chest pains.  Overall, his chest pain has not changed.  He has not had orthopnea or  leg edema.  He does note increased abdominal girth as well as bendopnea.  He has not had syncope.    Past Medical History:  Diagnosis Date  . Adenomatous colon polyp   . Arthritis   . CAD (coronary artery disease)    a. MI in 1996 with stent placement b. cath 01/18/17 - S/p PCI of in-stent restenosis of pLAD with cutting ballon & DES; 20% ostial LAD; 40% pro Cx; 60% focal pRCA  . Clostridium difficile infection   . Depression   . Diverticulosis   . H. pylori infection   . HTN (hypertension)   . Hyperlipidemia   .  MI (myocardial infarction) ([place])     Current Medications: Current Meds  Medication Sig  . Aspirin-Salicylamide-Caffeine (BC FAST PAIN RELIEF) 650-195-33.3 MG PACK Take 1 packet by mouth daily as needed (pain.).  Marland Kitchen atorvastatin (LIPITOR) 80 MG tablet Take 80 mg by mouth daily.  . clopidogrel (PLAVIX) 75 MG tablet TAKE 1 TABLET BY MOUTH EVERY DAY  . escitalopram (LEXAPRO) 20 MG tablet Take 20 mg by mouth daily.  Marland Kitchen esomeprazole (NEXIUM) 40 MG capsule Take 40 mg by mouth daily before breakfast.  . fenofibrate (TRICOR) 145 MG tablet TAKE 1 TABLET BY MOUTH EVERY DAY  . furosemide (LASIX) 20 MG tablet Take 1 tablet (20 mg total) by mouth daily.  Marland Kitchen HYDROcodone-acetaminophen (NORCO) 7.5-325 MG tablet Take 1 tablet by mouth daily as needed (pain). For pain.  . meloxicam (MOBIC) 7.5 MG tablet Take 7.5 mg by mouth daily.  . nitroGLYCERIN (NITROSTAT) 0.4 MG SL tablet Place 1 tablet (0.4 mg total) under the tongue every 5 (five) minutes as needed for chest pain.  . pantoprazole (PROTONIX) 40 MG tablet Take 40 mg by mouth daily.  . [DISCONTINUED] isosorbide mononitrate (IMDUR) 30 MG 24 hr tablet Take 1 tablet (30 mg total) by mouth daily.     Allergies:   Gabapentin   Social History   Tobacco Use  . Smoking status: Former Smoker    Quit date: 06/06/1994    Years since quitting: 26.2  . Smokeless tobacco: Never Used  Vaping Use  . Vaping Use: Never used  Substance Use Topics  . Alcohol use: No  . Drug use: No     Family Hx: The patient's family history includes Cancer in his sister; Heart attack in his mother; Heart attack (age of onset: 45) in his brother. There is no history of Colon cancer, Stomach cancer, Rectal cancer, or Esophageal cancer.  ROS   EKGs/Labs/Other Test Reviewed:    EKG:  EKG is not ordered today.  The ekg ordered today demonstrates n/a  Recent Labs: 07/24/2020: BUN 22; Creatinine, Ser 1.19; Hemoglobin 15.4; Platelets 260; Potassium 4.6; Sodium 139   Recent Lipid  Panel Lab Results  Component Value Date/Time   CHOL 123 05/06/2019 10:08 AM   TRIG 84 05/06/2019 10:08 AM   HDL 44 05/06/2019 10:08 AM   CHOLHDL 2.8 05/06/2019 10:08 AM   CHOLHDL 4 06/23/2009 11:47 AM   LDLCALC 63 05/06/2019 10:08 AM   LDLDIRECT 159.1 12/10/2007 08:51 AM      Risk Assessment/Calculations:      Physical Exam:    VS:  BP 108/64   Pulse 74   Ht 5\' 9"  (1.753 m)   Wt 228 lb 3.2 oz (103.5 kg)   SpO2 95%   BMI 33.70 kg/m     Wt Readings from Last 3 Encounters:  09/16/20 228 lb 3.2 oz (103.5 kg)  07/28/20 222  lb (100.7 kg)  07/24/20 230 lb (104.3 kg)     Constitutional:      Appearance: Healthy appearance. Not in distress.  Neck:     Vascular: JVD normal.  Pulmonary:     Effort: Pulmonary effort is normal.     Breath sounds: No wheezing.     Comments: Coarse breath sounds at the bases bilaterally (?  Rales) Cardiovascular:     Normal rate. Regular rhythm. Normal S1. Normal S2.     Murmurs: There is no murmur.  Edema:    Peripheral edema absent.  Abdominal:     General: There is distension.     Palpations: Abdomen is soft.  Skin:    General: Skin is warm and dry.  Neurological:     General: No focal deficit present.     Mental Status: Alert and oriented to person, place and time.     Cranial Nerves: Cranial nerves are intact.          ASSESSMENT & PLAN:    1. Chronic heart failure with preserved ejection fraction (HCC) Ejection fraction 55-60 with mild diastolic dysfunction on recent echo 08/24/2020.  His LVEDP was elevated during cardiac catheterization at 14.  He describes symptoms consistent with volume overload.  He also has coarse breath sounds on exam that sound consistent with rales.  Therefore, I have recommended starting on furosemide for diuresis.  Question if all of his symptoms are related to volume overload.  His blood pressure is somewhat on the low side.  I have asked him to decrease his isosorbide to 15 mg daily to avoid hypotension  after initiating furosemide.  -Decrease isosorbide mononitrate to 15 mg daily  -Start furosemide 20 mg daily  -BMET 1 week  -F/u 3-4 weeks  2. Coronary artery disease of native artery of native heart with stable angina pectoris [facility]) History of prior MI in 1996 and DES to LAD in 2018.  Recent cardiac catheterization demonstrated patent stent in the LAD and moderate disease in the distal LAD, proximal LCx and proximal RCA.  Residual disease was not hemodynamically significant by RFR.  Medical therapy was recommended.  He has not really noticed any significant change since starting on isosorbide.  As noted above, I suspect he is volume overloaded and this is contributing to his symptoms.  Adjust medications as outlined above.  Continue atorvastatin, clopidogrel.  3. Essential hypertension Blood pressure is well controlled.  I will adjust his isosorbide as noted above to avoid hypotension.  4. Hyperlipidemia LDL goal <70 LDL in November was 152.  He notes that he was off his medication at that time.  This has been resumed and is followed by primary care.  Goal LDL is <70.         Dispo:  Return in about 3 weeks (around 10/07/2020) for Routine follow up with [name], PA in 3 weeks. .   Medication Adjustments/Labs and Tests Ordered: Current medicines are reviewed at length with the patient today.  Concerns regarding medicines are outlined above.  Tests Ordered: Orders Placed This Encounter  Procedures  . Basic Metabolic Panel (BMET)   Medication Changes: Meds ordered this encounter  Medications  . isosorbide mononitrate (IMDUR) 30 MG 24 hr tablet    Sig: Take 0.5 tablets (15 mg total) by mouth daily.    Dispense:  45 tablet    Refill:  3  . furosemide (LASIX) 20 MG tablet    Sig: Take 1 tablet (20 mg total) by mouth  daily.    Dispense:  30 tablet    Refill:  [address], [name], [place]  09/16/2020 4:39 PM    [facility] [place],  [place], [place]  [address] Phone: [phone]; Fax: [phone]

## 2020-09-24 ENCOUNTER — Other Ambulatory Visit: Payer: Medicare Other

## 2020-09-28 ENCOUNTER — Other Ambulatory Visit: Payer: Medicare Other | Admitting: *Deleted

## 2020-09-28 ENCOUNTER — Other Ambulatory Visit: Payer: Self-pay

## 2020-09-28 DIAGNOSIS — I5032 Chronic diastolic (congestive) heart failure: Secondary | ICD-10-CM | POA: Diagnosis not present

## 2020-09-28 DIAGNOSIS — I10 Essential (primary) hypertension: Secondary | ICD-10-CM | POA: Diagnosis not present

## 2020-09-28 DIAGNOSIS — I25118 Atherosclerotic heart disease of native coronary artery with other forms of angina pectoris: Secondary | ICD-10-CM | POA: Diagnosis not present

## 2020-09-28 DIAGNOSIS — E785 Hyperlipidemia, unspecified: Secondary | ICD-10-CM | POA: Diagnosis not present

## 2020-09-28 LAB — BASIC METABOLIC PANEL
BUN/Creatinine Ratio: 17 (ref 10–24)
BUN: 22 mg/dL (ref 8–27)
CO2: 24 mmol/L (ref 20–29)
Calcium: 9.6 mg/dL (ref 8.6–10.2)
Chloride: 104 mmol/L (ref 96–106)
Creatinine, Ser: 1.3 mg/dL — ABNORMAL HIGH (ref 0.76–1.27)
Glucose: 133 mg/dL — ABNORMAL HIGH (ref 65–99)
Potassium: 4.2 mmol/L (ref 3.5–5.2)
Sodium: 142 mmol/L (ref 134–144)
eGFR: 56 mL/min/{1.73_m2} — ABNORMAL LOW (ref >59)

## 2020-10-16 ENCOUNTER — Ambulatory Visit: Payer: Medicare Other | Admitting: Physician Assistant

## 2020-10-16 NOTE — Progress Notes (Deleted)
Cardiology Office Note:    Date:  10/16/2020   ID:  [name], DOB [birth year], MRN [id number]  PCP:  [name], MD   [facility] Providers Cardiologist:  [name], MD { Click to update primary MD,subspecialty MD or APP then REFRESH:1}  ***  Referring MD: [name], MD   Chief Complaint:  No chief complaint on file.    Patient Profile:    [name] is a 80 y.o. male with:   Coronary artery disease  ? S/p MI in 1996 tx with stent ? S/p DES to LAD in 2018 ? Cath 2/22: LAD stent patent; mod non-obs dz in LCx, RCA, dLAD (neg RFR) >> Med Rx   (HFpEF) heart failure with preserved ejection fraction  ? Echocardiogram 3/22: EF 55-60  Hx of GI bleed  Hypertension   Hyperlipidemia   Prior CV studies: Echocardiogram 08/24/2020 EF 55-60, no RWMA, GR 1 DD, normal RVSF, moderate LAE, trivial MR, AV sclerosis without stenosis  LEFT HEART CATH  07/28/2020 Narrative  There is mild left ventricular systolic dysfunction.  LV end diastolic pressure is moderately elevated.  The left ventricular ejection fraction is 45-50% by visual estimate.  --------------------------------  3 sites evaluated with RFR/IFR (abnormal will be less than 0.88)  Ost LAD to Prox LAD lesion is 30% stenosed.  Prox LAD to Mid LAD - stent within stent is 10% stenosed. - RFR of ostial lesion - stented section = 0.96  Mid LAD to Dist LAD lesion is 45% stenosed. Dist LAD-1 lesion is 55% stenosed. -> RFR 0.92  Dist LAD-2 lesion the distal LAD tapers to roughly 50% of the more upstream vessel. No obvious lesion.  Prox Cx lesion is 55% stenosed. RFR 0.96-0.98  Prox RCA lesion is 60% stenosed. RFR 0.97  Mid RCA to Dist RCA lesion is 15% stenosed.  SUMMARY  Moderate three-vessel disease-nonflow limiting via RFR:  30% ostial proximal LAD prior to long stented segment with minimal ISR, distal LAD after major diagonal branch tapers to a relatively small caliber vessel  (<1.5 mm) -> RFR at this point was 0.92-not significant;  proximal LCx eccentric 55% (RFR 0.96-0.98);  proximal RCA eccentric/irregular 60% (RFR 0.97)  Mildly reduced LVEF (~EF 45%) with anterior hypokinesis, and moderately elevated LVEDP.  Consider microvascular disease with very small caliber distal LAD       GATED SPECT MYO PERF W/LEXISCAN STRESS 1D 02/25/2019 Narrative Fixed defect at apex, extending to distal anterior/inferior walls, consistent with infarct. No reversibility seen (and actually mild improvement in inferior walls with stress). Borderline low LV EF (51) with hypokinesis in same walls as perfusion defect. Intermediate risk for infarct but no appreciable ischemia.    History of Present Illness: Mr. Wynter was last seen 09/16/20.  He was started on Furosemide for volume overload.  He returns for f/u.  ***        Past Medical History:  Diagnosis Date  . Adenomatous colon polyp   . Arthritis   . CAD (coronary artery disease)    a. MI in 1996 with stent placement b. cath 01/18/17 - S/p PCI of in-stent restenosis of pLAD with cutting ballon & DES; 20% ostial LAD; 40% pro Cx; 60% focal pRCA  . Clostridium difficile infection   . Depression   . Diverticulosis   . H. pylori infection   . HTN (hypertension)   . Hyperlipidemia   . MI (myocardial infarction) ([place])     Current Medications: No outpatient medications have been marked  as taking for the 10/16/20 encounter (Appointment) with [name] T, PA-C.     Allergies:   Gabapentin   Social History   Tobacco Use  . Smoking status: Former Smoker    Quit date: 06/06/1994    Years since quitting: 26.3  . Smokeless tobacco: Never Used  Vaping Use  . Vaping Use: Never used  Substance Use Topics  . Alcohol use: No  . Drug use: No     Family Hx: The patient's family history includes Cancer in his sister; Heart attack in his mother; Heart attack (age of onset: 16) in his brother. There is no history of  Colon cancer, Stomach cancer, Rectal cancer, or Esophageal cancer.  ROS   EKGs/Labs/Other Test Reviewed:    EKG:  EKG is *** ordered today.  The ekg ordered today demonstrates ***  Recent Labs: 07/24/2020: Hemoglobin 15.4; Platelets 260 09/28/2020: BUN 22; Creatinine, Ser 1.30; Potassium 4.2; Sodium 142   Recent Lipid Panel Lab Results  Component Value Date/Time   CHOL 123 05/06/2019 10:08 AM   TRIG 84 05/06/2019 10:08 AM   HDL 44 05/06/2019 10:08 AM   CHOLHDL 2.8 05/06/2019 10:08 AM   CHOLHDL 4 06/23/2009 11:47 AM   LDLCALC 63 05/06/2019 10:08 AM   LDLDIRECT 159.1 12/10/2007 08:51 AM      Risk Assessment/Calculations:   {Does this patient have ATRIAL FIBRILLATION?:[phone]}  Physical Exam:    VS:  There were no vitals taken for this visit.    Wt Readings from Last 3 Encounters:  09/16/20 228 lb 3.2 oz (103.5 kg)  07/28/20 222 lb (100.7 kg)  07/24/20 230 lb (104.3 kg)     Physical Exam ***     ASSESSMENT & PLAN:    *** 1. Chronic heart failure with preserved ejection fraction (HCC) Ejection fraction 55-60 with mild diastolic dysfunction on recent echo 08/24/2020.  His LVEDP was elevated during cardiac catheterization at 14.  He describes symptoms consistent with volume overload.  He also has coarse breath sounds on exam that sound consistent with rales.  Therefore, I have recommended starting on furosemide for diuresis.  Question if all of his symptoms are related to volume overload.  His blood pressure is somewhat on the low side.  I have asked him to decrease his isosorbide to 15 mg daily to avoid hypotension after initiating furosemide.             -Decrease isosorbide mononitrate to 15 mg daily             -Start furosemide 20 mg daily             -BMET 1 week             -F/u 3-4 weeks  2. Coronary artery disease of native artery of native heart with stable angina pectoris [facility]) History of prior MI in 1996 and DES to LAD in 2018.  Recent cardiac  catheterization demonstrated patent stent in the LAD and moderate disease in the distal LAD, proximal LCx and proximal RCA.  Residual disease was not hemodynamically significant by RFR.  Medical therapy was recommended.  He has not really noticed any significant change since starting on isosorbide.  As noted above, I suspect he is volume overloaded and this is contributing to his symptoms.  Adjust medications as outlined above.  Continue atorvastatin, clopidogrel.  3. Essential hypertension Blood pressure is well controlled.  I will adjust his isosorbide as noted above to avoid hypotension.  4. Hyperlipidemia LDL goal <  70 LDL in November was 152.  He notes that he was off his medication at that time.  This has been resumed and is followed by primary care.  Goal LDL is <70. {Are you ordering a CV Procedure (e.g. stress test, cath, DCCV, TEE, etc)?   Press F2        :[id number]}    Dispo:  No follow-ups on file.   Medication Adjustments/Labs and Tests Ordered: Current medicines are reviewed at length with the patient today.  Concerns regarding medicines are outlined above.  Tests Ordered: No orders of the defined types were placed in this encounter.  Medication Changes: No orders of the defined types were placed in this encounter.   Signed, [name], PA-C  10/16/2020 7:50 AM    [facility] [place], [place], [place]  [address] Phone: [phone]; Fax: [phone]

## 2020-10-31 ENCOUNTER — Other Ambulatory Visit: Payer: Self-pay | Admitting: Cardiovascular Disease

## 2020-11-03 ENCOUNTER — Encounter: Payer: Self-pay | Admitting: Podiatry

## 2020-11-03 ENCOUNTER — Other Ambulatory Visit: Payer: Self-pay

## 2020-11-03 ENCOUNTER — Ambulatory Visit (INDEPENDENT_AMBULATORY_CARE_PROVIDER_SITE_OTHER): Payer: Medicare Other | Admitting: Podiatry

## 2020-11-03 DIAGNOSIS — N1831 Chronic kidney disease, stage 3a: Secondary | ICD-10-CM | POA: Diagnosis not present

## 2020-11-03 DIAGNOSIS — I130 Hypertensive heart and chronic kidney disease with heart failure and stage 1 through stage 4 chronic kidney disease, or unspecified chronic kidney disease: Secondary | ICD-10-CM | POA: Diagnosis not present

## 2020-11-03 DIAGNOSIS — I5022 Chronic systolic (congestive) heart failure: Secondary | ICD-10-CM | POA: Diagnosis not present

## 2020-11-03 DIAGNOSIS — B351 Tinea unguium: Secondary | ICD-10-CM

## 2020-11-03 DIAGNOSIS — D689 Coagulation defect, unspecified: Secondary | ICD-10-CM | POA: Diagnosis not present

## 2020-11-03 DIAGNOSIS — M79609 Pain in unspecified limb: Secondary | ICD-10-CM

## 2020-11-03 DIAGNOSIS — E1122 Type 2 diabetes mellitus with diabetic chronic kidney disease: Secondary | ICD-10-CM | POA: Diagnosis not present

## 2020-11-03 NOTE — Progress Notes (Signed)
This patient returns to my office for at risk foot care.  This patient requires this care by a professional since this patient will be at risk due to having coagulation defect.  Patient is taking plavix.  This patient is unable to cut nails himself since the patient cannot reach his nails.These nails are painful walking and wearing shoes.  This patient presents for at risk foot care today.  General Appearance  Alert, conversant and in no acute stress.  Vascular  Dorsalis pedis and posterior tibial  pulses are palpable  bilaterally.  Capillary return is within normal limits  bilaterally. Temperature is within normal limits  bilaterally.  Neurologic  Senn-Weinstein monofilament wire test within normal limits  bilaterally. Muscle power within normal limits bilaterally.  Nails Thick disfigured discolored nails with subungual debris  from hallux to fifth toes bilaterally. No evidence of bacterial infection or drainage bilaterally.  Orthopedic  No limitations of motion  feet .  No crepitus or effusions noted.  No bony pathology or digital deformities noted.  Skin  normotropic skin with no porokeratosis noted bilaterally.  No signs of infections or ulcers noted.     Onychomycosis  Pain in right toes  Pain in left toes  Consent was obtained for treatment procedures.   Mechanical debridement of nails 1-5  bilaterally performed with a nail nipper.  Filed with dremel without incident.    Return office visit    3 months                  Told patient to return for periodic foot care and evaluation due to potential at risk complications.   Sofija Antwi DPM  

## 2020-11-23 ENCOUNTER — Other Ambulatory Visit: Payer: Self-pay

## 2020-11-23 ENCOUNTER — Encounter: Payer: Self-pay | Admitting: Family

## 2020-11-23 ENCOUNTER — Ambulatory Visit (INDEPENDENT_AMBULATORY_CARE_PROVIDER_SITE_OTHER): Payer: Medicare Other | Admitting: Family

## 2020-11-23 VITALS — BP 126/78 | HR 85 | Ht 69.0 in | Wt 230.6 lb

## 2020-11-23 DIAGNOSIS — I5032 Chronic diastolic (congestive) heart failure: Secondary | ICD-10-CM | POA: Diagnosis not present

## 2020-11-23 DIAGNOSIS — I10 Essential (primary) hypertension: Secondary | ICD-10-CM

## 2020-11-23 DIAGNOSIS — I25118 Atherosclerotic heart disease of native coronary artery with other forms of angina pectoris: Secondary | ICD-10-CM | POA: Diagnosis not present

## 2020-11-23 DIAGNOSIS — E785 Hyperlipidemia, unspecified: Secondary | ICD-10-CM

## 2020-11-23 MED ORDER — ISOSORBIDE MONONITRATE ER 30 MG PO TB24: 45.0000 mg | ORAL_TABLET | Freq: Every day | ORAL | 3 refills | Status: DC

## 2020-11-23 NOTE — Patient Instructions (Addendum)
Medication Instructions:   Your physician has recommended you make the following change in your medication:   CHANGE Isosorbide Mononitrate (Imdur) to 45 mg daily  *If you need a refill on your cardiac medications before your next appointment, please call your pharmacy*   Lab Work: Your physician recommends lab work today: BMP, ProBNP, CBC  If you have labs (blood work) drawn today and your tests are completely normal, you will receive your results only by: MyChart Message (if you have MyChart) OR A paper copy in the mail If you have any lab test that is abnormal or we need to change your treatment, we will call you to review the results.   Testing/Procedures: None ordered today.   Follow-Up: At [facility], you and your health needs are our priority.  As part of our continuing mission to provide you with exceptional heart care, we have created designated Provider Care Teams.  These Care Teams include your primary Cardiologist (physician) and Advanced Practice Providers (APPs -  Physician Assistants and Nurse Practitioners) who all work together to provide you with the care you need, when you need it.  We recommend signing up for the patient portal called "MyChart".  Sign up information is provided on this After Visit Summary.  MyChart is used to connect with patients for Virtual Visits (Telemedicine).  Patients are able to view lab/test results, encounter notes, upcoming appointments, etc.  Non-urgent messages can be sent to your provider as well.   To learn more about what you can do with MyChart, go to [email or web address].    Your next appointment:   2 month(s)  01/05/2021 ARRIVE AT 10:30  The format for your next appointment:   In Person  Provider:   You may see [name], MD or one of the following Advanced Practice Providers on your designated Care Team:   [name], PA-C [employer], [place]   Other Instructions  Recommend increasing your physical activity to  help with your breathing. If you want to be referred to cardiac rehab, please let us know.   Heart Healthy Diet Recommendations: A low-salt diet is recommended. Meats should be grilled, baked, or boiled. Avoid fried foods. Focus on lean protein sources like fish or chicken with vegetables and fruits. The American Heart Association is a Microbiologist!  American Heart Association Diet and Lifeystyle Recommendations   Exercise recommendations: The American Heart Association recommends 150 minutes of moderate intensity exercise weekly. Try 30 minutes of moderate intensity exercise 4-5 times per week. This could include walking, jogging, or swimming.

## 2020-11-23 NOTE — Progress Notes (Signed)
Office Visit    Patient Name: [name] Date of Encounter: 11/23/2020  PCP:  [name], [place]  Cardiologist:  [name], MD  Advanced Practice Provider:  No care team member to display Electrophysiologist:  None   Chief Complaint    [name] is a 80 y.o. male with a hx of coronary artery disease s/p MI 1996 with stent and DES to the LAD 2018, HFpEF, previous GI bleed, hypertension, hyperlipidemia presents today for follow-up of diastolic heart failure  Past Medical History    Past Medical History:  Diagnosis Date   Adenomatous colon polyp    Arthritis    CAD (coronary artery disease)    a. MI in 1996 with stent placement b. cath 01/18/17 - S/p PCI of in-stent restenosis of pLAD with cutting ballon & DES; 20% ostial LAD; 40% pro Cx; 60% focal pRCA   Clostridium difficile infection    Depression    Diverticulosis    H. pylori infection    HTN (hypertension)    Hyperlipidemia    MI (myocardial infarction) [facility])    Past Surgical History:  Procedure Laterality Date   [place]   benign tumor in back of head, done at  [place]     hx of [place]; 1997; 01/18/2017   CORONARY STENT INTERVENTION N/A 01/18/2017   Procedure: CORONARY STENT INTERVENTION;  Surgeon: Troy Sine, MD;  Location: [place] [facility];  Service: Cardiovascular;  Laterality: N/A;   ESOPHAGOGASTRODUODENOSCOPY (EGD) WITH PROPOFOL N/A 06/14/2018   Procedure: ESOPHAGOGASTRODUODENOSCOPY (EGD) WITH PROPOFOL;  Surgeon: Gatha Mayer, MD;  [facility];  Service: Endoscopy;  Laterality: N/A;   INTRAVASCULAR PRESSURE WIRE/FFR STUDY N/A 07/28/2020   Procedure: INTRAVASCULAR PRESSURE WIRE/FFR STUDY;  Surgeon: Leonie Man, MD;  Location: [place] [facility];  Service: Cardiovascular;  Laterality: N/A;   JOINT REPLACEMENT     LEFT HEART CATH AND  CORONARY ANGIOGRAPHY N/A 01/18/2017   Procedure: LEFT HEART CATH AND CORONARY ANGIOGRAPHY;  Surgeon: Troy Sine, MD;  Location: [place] [facility];  Service: Cardiovascular;  Laterality: N/A;   LEFT HEART CATH AND CORONARY ANGIOGRAPHY N/A 07/28/2020   Procedure: LEFT HEART CATH AND CORONARY ANGIOGRAPHY;  Surgeon: Leonie Man, MD;  Location: [place] [facility];  Service: Cardiovascular;  Laterality: N/A;   stent x2 in the LAD  with intra-aortic balloon pump support  1996   TONSILLECTOMY     TOTAL KNEE ARTHROPLASTY Bilateral     Allergies  Allergies  Allergen Reactions   Gabapentin Other (See Comments)    Patient said while taking this medication it caused nightmares and loss of sleep    History of Present Illness    [name] is a 80 y.o. male with a hx of coronary artery disease s/p MI 1996 with stent and DES to the LAD 2018, HFpEF, previous GI bleed, hypertension, hyperlipidemia last seen 09/16/2020 by [name], PA.  His coronary artery disease dates back to an MI in 1996 with stent placement.  He had DES to LAD in 2018.  Cardiac catheterization 07/2018 with patent LAD stent, moderate nonobstructive disease in left circumflex, RCA, distal LAD with negative FFR recommended for medical management.  He was started on Imdur at that time.  He had echocardiogram 08/2018 with LVEF 55 to 60% and  grade 1 diastolic dysfunction.  At last clinic visit 09/18/2020 he noted symptoms consistent with volume overloaded.  Also coarse breath sounds on exam consistent with rales.  He was started on Lasix 20 mg daily to prevent hypotension Imdur was reduced to  mg daily.  He presents today for follow-up.  Notes he has been taking Imdur 30 mg daily instead of 15 mg as he is was unaware of this change.  Reports rare lightheadedness with quick position changes though overall not bothersome.  Reports no chest pain, pressure, tightness.  Reports pain in his legs with ambulating which is actually improved  compared to when ABIs were performed September 2020.  He is interested in weight loss and has been trying to increase his physical activity at the recommendation of his son.  He has done cardiac rehab in the past and tells me he has been considering joining a gym.  Reports no edema, orthopnea, PND.  Tells me his breathing is unchanged since starting Lasix 5 mg daily.  He works part-time doing [employer]ician, dispensing.  His blood pressure when measured at home has readings systolic 601U to 932T.  EKGs/Labs/Other Studies Reviewed:   The following studies were reviewed today  Echocardiogram 08/24/2020 EF 55-60, no RWMA, GR 1 DD, normal RVSF, moderate LAE, trivial MR, AV sclerosis without stenosis   LEFT HEART CATH  07/28/2020 Narrative  There is mild left ventricular systolic dysfunction.  LV end diastolic pressure is moderately elevated.  The left ventricular ejection fraction is 45-50% by visual estimate.  --------------------------------  3 sites evaluated with RFR/IFR (abnormal will be less than 0.88)  Ost LAD to Prox LAD lesion is 30% stenosed.  Prox LAD to Mid LAD - stent within stent is 10% stenosed. - RFR of ostial lesion - stented section = 0.96  Mid LAD to Dist LAD lesion is 45% stenosed. Dist LAD-1 lesion is 55% stenosed. -> RFR 0.92  Dist LAD-2 lesion the distal LAD tapers to roughly 50% of the more upstream vessel. No obvious lesion.  Prox Cx lesion is 55% stenosed. RFR 0.96-0.98  Prox RCA lesion is 60% stenosed. RFR 0.97  Mid RCA to Dist RCA lesion is 15% stenosed.   SUMMARY  Moderate three-vessel disease-nonflow limiting via RFR:  30% ostial proximal LAD prior to long stented segment with minimal ISR, distal LAD after major diagonal branch tapers to a relatively small caliber vessel (<1.5 mm) -> RFR at this point was 0.92-not significant;  proximal LCx eccentric 55% (RFR 0.96-0.98);  proximal RCA eccentric/irregular 60% (RFR 0.97)  Mildly reduced LVEF (~EF 45%)  with anterior hypokinesis, and moderately elevated LVEDP.  Consider microvascular disease with very small caliber distal LAD            GATED SPECT MYO PERF W/LEXISCAN STRESS 1D 02/25/2019 Narrative Fixed defect at apex, extending to distal anterior/inferior walls, consistent with infarct. No reversibility seen (and actually mild improvement in inferior walls with stress). Borderline low LV EF (51) with hypokinesis in same walls as perfusion defect. Intermediate risk for infarct but no appreciable ischemia.      EKG:  No EKG today  Recent Labs: 07/24/2020: Hemoglobin 15.4; Platelets 260 09/28/2020: BUN 22; Creatinine, Ser 1.30; Potassium 4.2; Sodium 142  Recent Lipid Panel    Component Value Date/Time   CHOL 123 05/06/2019 1008   TRIG 84 05/06/2019 1008   HDL 44 05/06/2019 1008   CHOLHDL 2.8 05/06/2019 1008   CHOLHDL 4 06/23/2009 1147   VLDL 23.4 06/23/2009 1147  LDLCALC 63 05/06/2019 1008   LDLDIRECT 159.1 12/10/2007 0851    Home Medications   Current Meds  Medication Sig   Aspirin-Salicylamide-Caffeine (BC FAST PAIN RELIEF) 650-195-33.3 MG PACK Take 1 packet by mouth daily as needed (pain.).   atorvastatin (LIPITOR) 80 MG tablet Take 80 mg by mouth daily.   clopidogrel (PLAVIX) 75 MG tablet TAKE 1 TABLET BY MOUTH EVERY DAY   escitalopram (LEXAPRO) 20 MG tablet Take 20 mg by mouth daily.   esomeprazole (NEXIUM) 40 MG capsule Take 40 mg by mouth daily before breakfast.   fenofibrate (TRICOR) 145 MG tablet TAKE 1 TABLET BY MOUTH EVERY DAY   furosemide (LASIX) 20 MG tablet Take 1 tablet (20 mg total) by mouth daily.   HYDROcodone-acetaminophen (NORCO) 7.5-325 MG tablet Take 1 tablet by mouth daily as needed (pain). For pain.   isosorbide mononitrate (IMDUR) 30 MG 24 hr tablet Take 0.5 tablets (15 mg total) by mouth daily.   meloxicam (MOBIC) 7.5 MG tablet Take 7.5 mg by mouth daily.   nitroGLYCERIN (NITROSTAT) 0.4 MG SL tablet Place 1 tablet (0.4 mg total) under the tongue  every 5 (five) minutes as needed for chest pain.   pantoprazole (PROTONIX) 40 MG tablet Take 40 mg by mouth daily.     Review of Systems   All other systems reviewed and are otherwise negative except as noted above.  Physical Exam    VS:  BP 126/78   Pulse 85   Ht 5\' 9"  (1.753 m)   Wt 230 lb 9.6 oz (104.6 kg)   SpO2 95%   BMI 34.05 kg/m  , BMI Body mass index is 34.05 kg/m.  Wt Readings from Last 3 Encounters:  11/23/20 230 lb 9.6 oz (104.6 kg)  09/16/20 228 lb 3.2 oz (103.5 kg)  07/28/20 222 lb (100.7 kg)    GEN: Well nourished, well developed, in no acute distress. HEENT: normal. Neck: Supple, no JVD, carotid bruits, or masses. Cardiac: RRR, no murmurs, rubs, or gallops. No clubbing, cyanosis, edema.  Radials/PT 2+ and equal bilaterally.  Respiratory:  Respirations regular and unlabored, clear to auscultation bilaterally. GI: Soft, nontender, nondistended. MS: No deformity or atrophy. Skin: Warm and dry, no rash. Neuro:  Strength and sensation are intact. Psych: Normal affect.  Assessment & Plan    HFpEF - Euvolemic and well compensated on exam. Continue Lasix 20mg  daily. Reports no change in dyspnea since starting. CBC, ProBNP, BMP today for monitoring and to rule out anemia as contributory. Heart healthy diet and regular cardiovascular exercise encouraged.   CAD - Reports no chest pain though persistent dyspnea on exertion. Increase imdur to 45mg  daily.  GDMT includes Plavix, Imdur, PRN Nitroglycerin. Heart healthy diet and regular cardiovascular exercise encouraged. Discussed referral to cardiac rehab which he politely declines.  If exertional dyspnea does not improve with increased dose Imdur could consider initiation of Ranexa.  I anticipate deconditioning is also contributory to his symptoms.  HTN - BP well controlled. Continue current antihypertensive regimen including Lasix 20mg  daily. Increase Imdur, as above.   HLD, LDL goal <70 - Continue Atorvastatin 80mg   daily, Fenofibrate 145mg  daily. Managed by PCP.   Disposition: Follow up in 2 month(s) with Dr. Acie Fredrickson or APP   Signed, Loel Dubonnet, NP 11/23/2020, 11:51 AM [place]

## 2020-11-24 ENCOUNTER — Telehealth: Payer: Self-pay | Admitting: Family

## 2020-11-24 LAB — BASIC METABOLIC PANEL
BUN/Creatinine Ratio: 19 (ref 10–24)
BUN: 22 mg/dL (ref 8–27)
CO2: 27 mmol/L (ref 20–29)
Calcium: 9.7 mg/dL (ref 8.6–10.2)
Chloride: 100 mmol/L (ref 96–106)
Creatinine, Ser: 1.16 mg/dL (ref 0.76–1.27)
Glucose: 189 mg/dL — ABNORMAL HIGH (ref 65–99)
Potassium: 4.5 mmol/L (ref 3.5–5.2)
Sodium: 139 mmol/L (ref 134–144)
eGFR: 64 mL/min/{1.73_m2} (ref >59)

## 2020-11-24 LAB — CBC
Hematocrit: 43.6 % (ref 37.5–51.0)
Hemoglobin: 14.7 g/dL (ref 13.0–17.7)
MCH: 29.8 pg (ref 26.6–33.0)
MCHC: 33.7 g/dL (ref 31.5–35.7)
MCV: 88 fL (ref 79–97)
Platelets: 241 10*3/uL (ref 150–450)
RBC: 4.94 x10E6/uL (ref 4.14–5.80)
RDW: 13.1 % (ref 11.6–15.4)
WBC: 5.4 10*3/uL (ref 3.4–10.8)

## 2020-11-24 LAB — PRO B NATRIURETIC PEPTIDE: NT-Pro BNP: 124 pg/mL (ref 0–486)

## 2020-11-24 NOTE — Telephone Encounter (Signed)
Patient states he would like to sign up for the exercise program discussed at his appointment.

## 2020-11-24 NOTE — Telephone Encounter (Signed)
Left detailed message advising referral had been placed to cardiac rehab for Pt.

## 2020-11-25 ENCOUNTER — Encounter (HOSPITAL_COMMUNITY): Payer: Self-pay | Admitting: *Deleted

## 2020-11-25 NOTE — Progress Notes (Signed)
Received referral from Dr. Kristopher Oppenheim for this pt to participate in Cardiac rehab with the diagnosis of Stable Angina.  Pt with Cardiac Cath 07/2020  that showed moderate 3 vessel disease ?microvascular disease.  Plan to treat medically.  Clinical review of pt follow up appt on 6/22 with Laurann Montana NP - cardiologist office note. Pt appropriate for scheduling for on site cardiac rehab and/or enrollment in Virtual Cardiac Rehab when able as there is wait list.  Pt Covid Risk Score is 7.  Will forward to staff for follow up. Cherre Huger, BSN Cardiac and Training and development officer

## 2020-12-03 DIAGNOSIS — N1831 Chronic kidney disease, stage 3a: Secondary | ICD-10-CM | POA: Diagnosis not present

## 2020-12-03 DIAGNOSIS — E1122 Type 2 diabetes mellitus with diabetic chronic kidney disease: Secondary | ICD-10-CM | POA: Diagnosis not present

## 2020-12-03 DIAGNOSIS — I130 Hypertensive heart and chronic kidney disease with heart failure and stage 1 through stage 4 chronic kidney disease, or unspecified chronic kidney disease: Secondary | ICD-10-CM | POA: Diagnosis not present

## 2020-12-03 DIAGNOSIS — I5022 Chronic systolic (congestive) heart failure: Secondary | ICD-10-CM | POA: Diagnosis not present

## 2020-12-04 ENCOUNTER — Telehealth (HOSPITAL_COMMUNITY): Payer: Self-pay

## 2020-12-04 NOTE — Telephone Encounter (Signed)
Called patient to see if he is interested in the Cardiac Rehab Program. Patient expressed interest. Explained scheduling process and went over insurance, patient verbalized understanding. Will contact patient for scheduling at a later date.

## 2020-12-04 NOTE — Telephone Encounter (Signed)
Pt insurance is active and benefits verified through Medicare a/b Co-pay 0, DED $233/$233 met, out of pocket 0/0 met, co-insurance 20% no pre-authorization required. Passport, 12/04/2020_0 :35am, REF# [id number]   2ndary insurance is active and benefits verified through [facility]. Co-pay 0, DED 0/0 met, out of pocket 0/0 met, co-insurance 0. No pre-authorization required. Passport, 12/04/2020_1 :38am, REF# [id number]     Will contact patient to see if he is interested in the Cardiac Rehab Program. If interested, patient will need to complete follow up appt. Once completed, patient will be contacted for scheduling upon review by the RN Navigator.

## 2020-12-14 DIAGNOSIS — Z961 Presence of intraocular lens: Secondary | ICD-10-CM | POA: Diagnosis not present

## 2020-12-14 DIAGNOSIS — H31011 Macula scars of posterior pole (postinflammatory) (post-traumatic), right eye: Secondary | ICD-10-CM | POA: Diagnosis not present

## 2020-12-14 DIAGNOSIS — H04123 Dry eye syndrome of bilateral lacrimal glands: Secondary | ICD-10-CM | POA: Diagnosis not present

## 2020-12-14 DIAGNOSIS — H5500 Unspecified nystagmus: Secondary | ICD-10-CM | POA: Diagnosis not present

## 2020-12-31 ENCOUNTER — Encounter (HOSPITAL_COMMUNITY): Payer: Self-pay

## 2020-12-31 DIAGNOSIS — E785 Hyperlipidemia, unspecified: Secondary | ICD-10-CM | POA: Diagnosis not present

## 2020-12-31 NOTE — Telephone Encounter (Signed)
Attempted to call patient in regards to Cardiac Rehab - LM on VM Mailed letter 

## 2021-01-03 DIAGNOSIS — I130 Hypertensive heart and chronic kidney disease with heart failure and stage 1 through stage 4 chronic kidney disease, or unspecified chronic kidney disease: Secondary | ICD-10-CM | POA: Diagnosis not present

## 2021-01-03 DIAGNOSIS — E1122 Type 2 diabetes mellitus with diabetic chronic kidney disease: Secondary | ICD-10-CM | POA: Diagnosis not present

## 2021-01-03 DIAGNOSIS — N1831 Chronic kidney disease, stage 3a: Secondary | ICD-10-CM | POA: Diagnosis not present

## 2021-01-03 DIAGNOSIS — I5022 Chronic systolic (congestive) heart failure: Secondary | ICD-10-CM | POA: Diagnosis not present

## 2021-01-07 DIAGNOSIS — I209 Angina pectoris, unspecified: Secondary | ICD-10-CM | POA: Diagnosis not present

## 2021-01-07 DIAGNOSIS — I25118 Atherosclerotic heart disease of native coronary artery with other forms of angina pectoris: Secondary | ICD-10-CM | POA: Diagnosis not present

## 2021-01-07 DIAGNOSIS — I130 Hypertensive heart and chronic kidney disease with heart failure and stage 1 through stage 4 chronic kidney disease, or unspecified chronic kidney disease: Secondary | ICD-10-CM | POA: Diagnosis not present

## 2021-01-07 DIAGNOSIS — E782 Mixed hyperlipidemia: Secondary | ICD-10-CM | POA: Diagnosis not present

## 2021-01-07 DIAGNOSIS — E1122 Type 2 diabetes mellitus with diabetic chronic kidney disease: Secondary | ICD-10-CM | POA: Diagnosis not present

## 2021-01-24 ENCOUNTER — Encounter: Payer: Self-pay | Admitting: Cardiovascular Disease

## 2021-01-24 NOTE — Progress Notes (Signed)
Cardiology Office Note:    Date:  01/25/2021   ID:  [name], DOB 06-01-1941, MRN [id number]  PCP:  [name], MD  Cardiologist:  [name], MD    Referring MD: [name], MD   Chief Complaint  Patient presents with   Coronary Artery Disease   Hyperlipidemia        [name] is a 80 y.o. male with a hx of Coronary artery disease been years ago. He is a previous patient of Dr. Bing Quarry. He presents now with symptoms of exertional chest pain and shortness breath.  He has shortness of breath at night or during the day  Walking 5-6 minutes causes significant dyspnea.  Also has claudication in his legs with walking   Has occasional chest pain  - usually when he is at rest. Has chest squeezing. - occurs randomly  Has lots of indigestion - occurs randomly   Also has lots of sweating - not necessarliy related to his chest tightness  does not feel similar to his previous MI pain .   No CP since that time   Hx of MI in 1996 -  Had stent by [name] Stopped smoking at that time   Does not exercise regularly    .   Works out in the yard.   Sept. 5, 2018: [name] is seen today for follow up visit.   S/p recent stenting of his prox - mid LAD  Hx of CAD , stenting by [name] in the past .   PTs BP has been low Has been on a diet and has been cutting back on his protein and his food in general .  Has lost 8.5 lbs   Had an episode of CP at night.   Better after NTG . Still fairly active .   Walks a little ,  Just started a walking program .   Walks 1/2 mile a day without CP  His medical doctor had recently started him on a BP med and he has stopped it now (does not remember the name of it)   Echo Aug. 28 shows normal LV function   August 18, 2017: [name] is seen today for follow-up of his coronary artery disease. He has a history of coronary artery disease and is status post stent placement to the left anterior descending artery in 2017/02/10 Overall he is doing very well.  Is not had any episodes of angina since his PCI in 10-Feb-2017.  He does have some shortness of breath with exertion.  He is exercising fairly regularly.  Wife is had problems and he has had to help her.  He is lost some weight since his angioplasty.    His last lipid level shows a triglyceride level of 229.  His total cholesterol is 158.  HDL is 39.  LDL 73.  Jan. 6, 2020  [name] is seen today for follow-up of his coronary artery disease.  He status post stent placement to the left anterior descending artery in 02-10-17.  Wife passed away several months .  Pneumonia and sepsis. Had been married 75 years.    Has not had any angina      June 19, 2018  [name] was seen last week for follow-up of his coronary artery disease.  Several days later he was seen in the [facility] long emergency room with a GI bleed. Is now on Protonox and Plavix is on hold until Jan. 27.   Stent placent  was aug. 2018.   ECG revealed 2 ulcers - 1 large and 1 small  Seems to be getting better  Still has DOE   Sept. 10, 2020 :  [name] is seen today  Hx of CAD and GI bleeding  Has had tightness in his chest  Has chest tightness, pressure with exertion. Lasted 1 hour- took a SL NTG which helped  Occurs with rest and exertion  Radiates to left shoulder , burning shoulder pain  NTG helps  Has been going on for several months . Not associated with syncope or near syncope  Has taken 2 SL  NTG over the past several weeks.   Has severe leg pain  Has severe leg weakness with walking  Will get lower extremity arterial evaluation ( ABI or duplex scan)  Does not get any regular exercise  Has been eating more take out than usual   May 09, 2019:  [name] is seen today for a follow-up visit for his coronary artery disease  He has a history of claudication-like symptoms but lower extremity arterial duplex scan was normal. He has a history of hypertension hyperlipidemia.  He  has a past history of smoking.  Has a hx of GI bleeding .   Seems to be improving. Has a history of coronary artery disease.  Stress Myoview study from September, 2020 reveals an old anterior wall myocardial infarction but no ischemia.  His ejection fraction was 51% at that time.  Recent labs from May 06, 2019 reveals a total cholesterol of 123.  The HDL is 44.  LDL is 63.  The triglyceride level is 84.  Glucose is 102.  Sodium and potassium are normal.  Creatinine is 1.39.  Liver enzymes are  Normal.  Feb. 18, 2022: [name] is seen for follow-up of his coronary artery disease.  He is also here for preoperative evaluation prior to lumber steroid injection . He is on plavix 75 mg a day .  He had stenting of his mid LAD in Aug. 2018 myoview from Sept. 2020 reveals old ant Mi.  No ischemia  Having some chest pain for the past several months  Seems to be getting worse , needs to have his NTG refilled . Seems to radiate to his left sholder  Lots of dyspnea  Fatigue  Avoiding salt .    Aug. 22, 2022 [name] is seen for follow up of his CAD Has occasional episodes of heat and sweats  Has occasional episodes of chest soreness with left arm pain  No regular exercise - but no episodes of CP with activity  Has had weight gain , no weight loss, No diarrhea or constipation  He was admitted to the hospital in February, 2022 with chest pain.  Heart catheterization revealed mild stenosis of the proximal LAD.  There was moderate disease in the circumflex artery and the right coronary artery. Imdur was added to his meds -  he increased Imdur to 45 mg a day - did not seem to help much   Past Medical History:  Diagnosis Date   Adenomatous colon polyp    Arthritis    CAD (coronary artery disease)    a. MI in 1996 with stent placement b. cath 01/18/17 - S/p PCI of in-stent restenosis of pLAD with cutting ballon & DES; 20% ostial LAD; 40% pro Cx; 60% focal pRCA   Clostridium difficile infection     Depression    Diverticulosis    H. pylori infection    HTN (  hypertension)    Hyperlipidemia    MI (myocardial infarction) [name]     Past Surgical History:  Procedure Laterality Date   BRAIN TUMOR EXCISION  1954   benign tumor in back of head, done at  [place]     hx of [place]; 1997; 01/18/2017   CORONARY STENT INTERVENTION N/A 01/18/2017   Procedure: CORONARY STENT INTERVENTION;  Surgeon: Troy Sine, MD;  Location: [place] [facility];  Service: Cardiovascular;  Laterality: N/A;   ESOPHAGOGASTRODUODENOSCOPY (EGD) WITH PROPOFOL N/A 06/14/2018   Procedure: ESOPHAGOGASTRODUODENOSCOPY (EGD) WITH PROPOFOL;  Surgeon: Gatha Mayer, MD;  [facility];  Service: Endoscopy;  Laterality: N/A;   INTRAVASCULAR PRESSURE WIRE/FFR STUDY N/A 07/28/2020   Procedure: INTRAVASCULAR PRESSURE WIRE/FFR STUDY;  Surgeon: Leonie Man, MD;  Location: [place] [facility];  Service: Cardiovascular;  Laterality: N/A;   JOINT REPLACEMENT     LEFT HEART CATH AND CORONARY ANGIOGRAPHY N/A 01/18/2017   Procedure: LEFT HEART CATH AND CORONARY ANGIOGRAPHY;  Surgeon: Troy Sine, MD;  Location: [place] [facility];  Service: Cardiovascular;  Laterality: N/A;   LEFT HEART CATH AND CORONARY ANGIOGRAPHY N/A 07/28/2020   Procedure: LEFT HEART CATH AND CORONARY ANGIOGRAPHY;  Surgeon: Leonie Man, MD;  Location: [place] [facility];  Service: Cardiovascular;  Laterality: N/A;   stent x2 in the LAD  with intra-aortic balloon pump support  1996   TONSILLECTOMY     TOTAL KNEE ARTHROPLASTY Bilateral     Current Medications: Current Meds  Medication Sig   Aspirin-Salicylamide-Caffeine (BC FAST PAIN RELIEF) 650-195-33.3 MG PACK Take 1 packet by mouth daily as needed (pain.).   atorvastatin (LIPITOR) 80 MG tablet Take 80 mg by mouth daily.   clopidogrel (PLAVIX) 75 MG tablet TAKE 1 TABLET BY MOUTH EVERY DAY    escitalopram (LEXAPRO) 20 MG tablet Take 20 mg by mouth daily.   esomeprazole (NEXIUM) 40 MG capsule Take 40 mg by mouth daily before breakfast.   fenofibrate (TRICOR) 145 MG tablet TAKE 1 TABLET BY MOUTH EVERY DAY   HYDROcodone-acetaminophen (NORCO) 7.5-325 MG tablet Take 1 tablet by mouth daily as needed (pain). For pain.   isosorbide mononitrate (IMDUR) 30 MG 24 hr tablet Take 1.5 tablets (45 mg total) by mouth daily.   meloxicam (MOBIC) 7.5 MG tablet Take 7.5 mg by mouth daily.   nitroGLYCERIN (NITROSTAT) 0.4 MG SL tablet Place 1 tablet (0.4 mg total) under the tongue every 5 (five) minutes as needed for chest pain.   pantoprazole (PROTONIX) 40 MG tablet Take 40 mg by mouth daily.     Allergies:   Gabapentin   Social History   Socioeconomic History   Marital status: Married    Spouse name: Not on file   Number of children: 1   Years of education: Not on file   Highest education level: Not on file  Occupational History   Occupation: Retired  Tobacco Use   Smoking status: Former    Types: Cigarettes    Quit date: 06/06/1994    Years since quitting: 26.6   Smokeless tobacco: Never  Vaping Use   Vaping Use: Never used  Substance and Sexual Activity   Alcohol use: No   Drug use: No   Sexual activity: Yes  Other Topics Concern   Not on file  Social History Narrative   Not on file   Social Determinants of  Health   Financial Resource Strain: Not on file  Food Insecurity: Not on file  Transportation Needs: Not on file  Physical Activity: Not on file  Stress: Not on file  Social Connections: Not on file     Family History: The patient's family history includes Cancer in his sister; Heart attack in his mother; Heart attack (age of onset: 78) in his brother. There is no history of Colon cancer, Stomach cancer, Rectal cancer, or Esophageal cancer. ROS:   Please see the history of present illness.     All other systems reviewed and are negative.  EKGs/Labs/Other Studies  Reviewed:    Physical Exam: Blood pressure 136/82, pulse 75, height '5\' 9"'$  (1.753 m), weight 224 lb 3.2 oz (101.7 kg), SpO2 92 %.  GEN:  Well nourished, well developed in no acute distress HEENT: Normal NECK: No JVD; No carotid bruits LYMPHATICS: No lymphadenopathy CARDIAC: RRR , no murmurs, rubs, gallops RESPIRATORY:  Clear to auscultation without rales, wheezing or rhonchi  ABDOMEN: Soft, non-tender, non-distended MUSCULOSKELETAL:  No edema; No deformity  SKIN: Warm and dry NEUROLOGIC:  Alert and oriented x 3    EKG:       Recent Labs: 11/23/2020: BUN 22; Creatinine, Ser 1.16; Hemoglobin 14.7; NT-Pro BNP 124; Platelets 241; Potassium 4.5; Sodium 139  Recent Lipid Panel    Component Value Date/Time   CHOL 123 05/06/2019 1008   TRIG 84 05/06/2019 1008   HDL 44 05/06/2019 1008   CHOLHDL 2.8 05/06/2019 1008   CHOLHDL 4 06/23/2009 1147   VLDL 23.4 06/23/2009 1147   [place] 63 05/06/2019 1008   LDLDIRECT 159.1 12/10/2007 0851      ASSESSMENT: PLAN:     Coronary artery disease:     known CAD.   Has mild - mod CAD by cath I Feb. 2022  He has episodes of CP lasting a few seconds.    Perhaps related to an arrhytymia. Will place an event monitor to see if we can find any explanation of his episodic chest pain   2.  Chronic diastolic congestive heart failure.       3.  Hyperlipidemia:  labs were stable at his primary MD several months ago    4.  Possible claudication:   5.   He needs to have a steroid injection into his back .  Will need to wait and clear him after we see how the cath turns out .    Medication Adjustments/Labs and Tests Ordered: Current medicines are reviewed at length with the patient today.  Concerns regarding medicines are outlined above.  Orders Placed This Encounter  Procedures   Lipid Profile   Basic Metabolic Panel (BMET)   Hepatic function panel   LONG TERM MONITOR (3-14 DAYS)      No orders of the defined types were placed in this  encounter.   Signed, [name], MD  01/25/2021 5:28 PM    [place]

## 2021-01-25 ENCOUNTER — Telehealth (HOSPITAL_COMMUNITY): Payer: Self-pay

## 2021-01-25 ENCOUNTER — Encounter: Payer: Self-pay | Admitting: Cardiovascular Disease

## 2021-01-25 ENCOUNTER — Ambulatory Visit (INDEPENDENT_AMBULATORY_CARE_PROVIDER_SITE_OTHER): Payer: Medicare Other

## 2021-01-25 ENCOUNTER — Ambulatory Visit (INDEPENDENT_AMBULATORY_CARE_PROVIDER_SITE_OTHER): Payer: Medicare Other | Admitting: Cardiovascular Disease

## 2021-01-25 ENCOUNTER — Other Ambulatory Visit: Payer: Self-pay

## 2021-01-25 VITALS — BP 136/82 | HR 75 | Ht 69.0 in | Wt 224.2 lb

## 2021-01-25 DIAGNOSIS — I5032 Chronic diastolic (congestive) heart failure: Secondary | ICD-10-CM | POA: Diagnosis not present

## 2021-01-25 DIAGNOSIS — I2510 Atherosclerotic heart disease of native coronary artery without angina pectoris: Secondary | ICD-10-CM

## 2021-01-25 DIAGNOSIS — R079 Chest pain, unspecified: Secondary | ICD-10-CM

## 2021-01-25 DIAGNOSIS — R0789 Other chest pain: Secondary | ICD-10-CM

## 2021-01-25 DIAGNOSIS — E785 Hyperlipidemia, unspecified: Secondary | ICD-10-CM | POA: Diagnosis not present

## 2021-01-25 DIAGNOSIS — I5043 Acute on chronic combined systolic (congestive) and diastolic (congestive) heart failure: Secondary | ICD-10-CM

## 2021-01-25 DIAGNOSIS — R42 Dizziness and giddiness: Secondary | ICD-10-CM

## 2021-01-25 DIAGNOSIS — I10 Essential (primary) hypertension: Secondary | ICD-10-CM

## 2021-01-25 NOTE — Patient Instructions (Signed)
Medication Instructions:  Your physician recommends that you continue on your current medications as directed. Please refer to the Current Medication list given to you today.  *If you need a refill on your cardiac medications before your next appointment, please call your pharmacy*   Lab Work: Your physician recommends that you return for lab work in: 6 months on the day of or a few days before your office visit.  You will need to FAST for this appointment - nothing to eat or drink after midnight the night before except water.  If you have labs (blood work) drawn today and your tests are completely normal, you will receive your results only by: [place] (if you have MyChart) OR A paper copy in the mail If you have any lab test that is abnormal or we need to change your treatment, we will call you to review the results.   Testing/Procedures: [place]- Long Term Monitor Instructions  Your physician has requested you wear a ZIO patch monitor for 14 days.  This is a single patch monitor. [employer] supplies one patch monitor per enrollment. Additional stickers are not available. Please do not apply patch if you will be having a Nuclear Stress Test,  Echocardiogram, Cardiac CT, MRI, or Chest Xray during the period you would be wearing the  monitor. The patch cannot be worn during these tests. You cannot remove and re-apply the  ZIO XT patch monitor.  Your ZIO patch monitor will be mailed 3 day USPS to your address on file. It may take 3-5 days  to receive your monitor after you have been enrolled.  Once you have received your monitor, please review the enclosed instructions. Your monitor  has already been registered assigning a specific monitor serial # to you.  Billing and Patient Assistance Program Information  We have supplied [employer] with any of your insurance information on file for billing purposes. [employer] offers a sliding scale Patient Assistance Program for patients that do not  have  insurance, or whose insurance does not completely cover the cost of the ZIO monitor.  You must apply for the Patient Assistance Program to qualify for this discounted rate.  To apply, please call [employer] at [phone], select option 4, select option 2, ask to apply for  Patient Assistance Program. Theodore Demark will ask your household income, and how many people  are in your household. They will quote your out-of-pocket cost based on that information.  [employer] will also be able to set up a 52-month interest-free payment plan if needed.  Applying the monitor   Shave hair from upper left chest.  Hold abrader disc by orange tab. Rub abrader in 40 strokes over the upper left chest as  indicated in your monitor instructions.  Clean area with 4 enclosed alcohol pads. Let dry.  Apply patch as indicated in monitor instructions. Patch will be placed under collarbone on left  side of chest with arrow pointing upward.  Rub patch adhesive wings for 2 minutes. Remove white label marked "1". Remove the white  label marked "2". Rub patch adhesive wings for 2 additional minutes.  While looking in a mirror, press and release button in center of patch. A small green light will  flash 3-4 times. This will be your only indicator that the monitor has been turned on.  Do not shower for the first 24 hours. You may shower after the first 24 hours.  Press the button if you feel a symptom. You will hear a small  click. Record Date, Time and  Symptom in the Patient Logbook.  When you are ready to remove the patch, follow instructions on the last 2 pages of Patient  Logbook. Stick patch monitor onto the last page of Patient Logbook.  Place Patient Logbook in the blue and white box. Use locking tab on box and tape box closed  securely. The blue and white box has prepaid postage on it. Please place it in the mailbox as  soon as possible. Your physician should have your test results approximately 7 days after the   monitor has been mailed back to [facility].  Call [place] at [phone] if you have questions regarding  your ZIO XT patch monitor. Call them immediately if you see an orange light blinking on your  monitor.  If your monitor falls off in less than 4 days, contact our Monitor department at [phone].  If your monitor becomes loose or falls off after 4 days call [employer] at [phone] for  suggestions on securing your monitor    Follow-Up: At [facility], you and your health needs are our priority.  As part of our continuing mission to provide you with exceptional heart care, we have created designated Provider Care Teams.  These Care Teams include your primary Cardiologist (physician) and Advanced Practice Providers (APPs -  Physician Assistants and Nurse Practitioners) who all work together to provide you with the care you need, when you need it.  We recommend signing up for the patient portal called "MyChart".  Sign up information is provided on this After Visit Summary.  MyChart is used to connect with patients for Virtual Visits (Telemedicine).  Patients are able to view lab/test results, encounter notes, upcoming appointments, etc.  Non-urgent messages can be sent to your provider as well.   To learn more about what you can do with MyChart, go to [email or web address].    Your next appointment:   6 month(s)  The format for your next appointment:   In Person  Provider:   You will see [name], MD or one of the following Advanced Practice Providers on your designated Care Team:   [name], PA-C [place], [place]

## 2021-01-25 NOTE — Progress Notes (Unsigned)
Patient enrolled for [employer] to mail a 14 day ZIO XT monitor to his address on file.

## 2021-01-25 NOTE — Telephone Encounter (Signed)
No response from pt in regards to cardiac rehab. Closed referral 

## 2021-01-27 ENCOUNTER — Other Ambulatory Visit: Payer: Self-pay | Admitting: *Deleted

## 2021-01-27 DIAGNOSIS — R079 Chest pain, unspecified: Secondary | ICD-10-CM | POA: Diagnosis not present

## 2021-01-27 DIAGNOSIS — I2510 Atherosclerotic heart disease of native coronary artery without angina pectoris: Secondary | ICD-10-CM | POA: Diagnosis not present

## 2021-01-27 DIAGNOSIS — R0789 Other chest pain: Secondary | ICD-10-CM

## 2021-01-27 DIAGNOSIS — I5032 Chronic diastolic (congestive) heart failure: Secondary | ICD-10-CM

## 2021-01-27 DIAGNOSIS — R42 Dizziness and giddiness: Secondary | ICD-10-CM | POA: Diagnosis not present

## 2021-01-27 MED ORDER — CLOPIDOGREL BISULFATE 75 MG PO TABS: 75.0000 mg | ORAL_TABLET | Freq: Every day | ORAL | 3 refills | Status: DC

## 2021-01-27 MED ORDER — FENOFIBRATE 145 MG PO TABS: 145.0000 mg | ORAL_TABLET | Freq: Every day | ORAL | 3 refills | Status: DC

## 2021-01-27 MED ORDER — ISOSORBIDE MONONITRATE ER 30 MG PO TB24: 45.0000 mg | ORAL_TABLET | Freq: Every day | ORAL | 3 refills | Status: DC

## 2021-02-02 ENCOUNTER — Telehealth: Payer: Self-pay

## 2021-02-02 NOTE — Telephone Encounter (Signed)
Patient was able to tape his ZIO XT monitor in place.  He had worn his monitor for 7 days.  Instructed patient to attempt to wear as long as possible of the 14 day period by taping in place.  If the monitor falls off and he is no longer able to tape in place, mail the monitor back to [employer] to be processed.

## 2021-02-02 NOTE — Telephone Encounter (Signed)
Patient called in stating the adhesive for his monitor is coming off and he wants to know if he should take it off and return it? If you can call patient back and advise him what to do next

## 2021-02-03 DIAGNOSIS — E1122 Type 2 diabetes mellitus with diabetic chronic kidney disease: Secondary | ICD-10-CM | POA: Diagnosis not present

## 2021-02-03 DIAGNOSIS — I129 Hypertensive chronic kidney disease with stage 1 through stage 4 chronic kidney disease, or unspecified chronic kidney disease: Secondary | ICD-10-CM | POA: Diagnosis not present

## 2021-02-03 DIAGNOSIS — I5022 Chronic systolic (congestive) heart failure: Secondary | ICD-10-CM | POA: Diagnosis not present

## 2021-02-03 DIAGNOSIS — M25511 Pain in right shoulder: Secondary | ICD-10-CM | POA: Diagnosis not present

## 2021-02-03 DIAGNOSIS — N1831 Chronic kidney disease, stage 3a: Secondary | ICD-10-CM | POA: Diagnosis not present

## 2021-02-03 DIAGNOSIS — E782 Mixed hyperlipidemia: Secondary | ICD-10-CM | POA: Diagnosis not present

## 2021-02-03 DIAGNOSIS — I25118 Atherosclerotic heart disease of native coronary artery with other forms of angina pectoris: Secondary | ICD-10-CM | POA: Diagnosis not present

## 2021-02-03 DIAGNOSIS — I130 Hypertensive heart and chronic kidney disease with heart failure and stage 1 through stage 4 chronic kidney disease, or unspecified chronic kidney disease: Secondary | ICD-10-CM | POA: Diagnosis not present

## 2021-02-09 ENCOUNTER — Ambulatory Visit (INDEPENDENT_AMBULATORY_CARE_PROVIDER_SITE_OTHER): Payer: Medicare Other | Admitting: Podiatry

## 2021-02-09 ENCOUNTER — Other Ambulatory Visit: Payer: Self-pay

## 2021-02-09 ENCOUNTER — Encounter: Payer: Self-pay | Admitting: Podiatry

## 2021-02-09 DIAGNOSIS — M79609 Pain in unspecified limb: Secondary | ICD-10-CM

## 2021-02-09 DIAGNOSIS — B351 Tinea unguium: Secondary | ICD-10-CM | POA: Diagnosis not present

## 2021-02-09 DIAGNOSIS — D689 Coagulation defect, unspecified: Secondary | ICD-10-CM | POA: Diagnosis not present

## 2021-02-09 NOTE — Progress Notes (Signed)
This patient returns to my office for at risk foot care.  This patient requires this care by a professional since this patient will be at risk due to having coagulation defect.  Patient is taking plavix.  This patient is unable to cut nails himself since the patient cannot reach his nails.These nails are painful walking and wearing shoes.  This patient presents for at risk foot care today.  General Appearance  Alert, conversant and in no acute stress.  Vascular  Dorsalis pedis and posterior tibial  pulses are palpable  bilaterally.  Capillary return is within normal limits  bilaterally. Temperature is within normal limits  bilaterally.  Neurologic  Senn-Weinstein monofilament wire test within normal limits  bilaterally. Muscle power within normal limits bilaterally.  Nails Thick disfigured discolored nails with subungual debris  from hallux to fifth toes bilaterally. No evidence of bacterial infection or drainage bilaterally.  Orthopedic  No limitations of motion  feet .  No crepitus or effusions noted.  No bony pathology or digital deformities noted.  Skin  normotropic skin with no porokeratosis noted bilaterally.  No signs of infections or ulcers noted.     Onychomycosis  Pain in right toes  Pain in left toes  Consent was obtained for treatment procedures.   Mechanical debridement of nails 1-5  bilaterally performed with a nail nipper.  Filed with dremel without incident.    Return office visit    3 months                  Told patient to return for periodic foot care and evaluation due to potential at risk complications.   Nyjah Denio DPM  

## 2021-02-15 DIAGNOSIS — R0789 Other chest pain: Secondary | ICD-10-CM | POA: Diagnosis not present

## 2021-02-15 DIAGNOSIS — R079 Chest pain, unspecified: Secondary | ICD-10-CM | POA: Diagnosis not present

## 2021-02-15 DIAGNOSIS — I2510 Atherosclerotic heart disease of native coronary artery without angina pectoris: Secondary | ICD-10-CM | POA: Diagnosis not present

## 2021-02-15 DIAGNOSIS — I5032 Chronic diastolic (congestive) heart failure: Secondary | ICD-10-CM | POA: Diagnosis not present

## 2021-02-16 ENCOUNTER — Telehealth: Payer: Self-pay | Admitting: Cardiovascular Disease

## 2021-02-16 NOTE — Telephone Encounter (Signed)
Pt advised his monitor results and verbalized understanding.

## 2021-02-16 NOTE — Telephone Encounter (Signed)
Patient is returning call to discuss results. 

## 2021-03-03 DIAGNOSIS — H04321 Acute dacryocystitis of right lacrimal passage: Secondary | ICD-10-CM | POA: Diagnosis not present

## 2021-03-08 DIAGNOSIS — M25511 Pain in right shoulder: Secondary | ICD-10-CM | POA: Diagnosis not present

## 2021-03-12 DIAGNOSIS — Z23 Encounter for immunization: Secondary | ICD-10-CM | POA: Diagnosis not present

## 2021-03-17 DIAGNOSIS — M19111 Post-traumatic osteoarthritis, right shoulder: Secondary | ICD-10-CM | POA: Diagnosis not present

## 2021-03-17 DIAGNOSIS — E782 Mixed hyperlipidemia: Secondary | ICD-10-CM | POA: Diagnosis not present

## 2021-03-17 DIAGNOSIS — M25511 Pain in right shoulder: Secondary | ICD-10-CM | POA: Diagnosis not present

## 2021-03-25 DIAGNOSIS — H04411 Chronic dacryocystitis of right lacrimal passage: Secondary | ICD-10-CM | POA: Diagnosis not present

## 2021-03-27 ENCOUNTER — Emergency Department (HOSPITAL_COMMUNITY)
Admission: EM | Admit: 2021-03-27 | Discharge: 2021-03-27 | Disposition: A | Discharge status: Home / Self Care | Payer: Medicare Other | Attending: Emergency Medicine | Admitting: Emergency Medicine

## 2021-03-27 ENCOUNTER — Other Ambulatory Visit: Payer: Self-pay

## 2021-03-27 ENCOUNTER — Emergency Department (HOSPITAL_COMMUNITY): Payer: Medicare Other

## 2021-03-27 ENCOUNTER — Encounter (HOSPITAL_COMMUNITY): Payer: Self-pay | Admitting: Emergency Medicine

## 2021-03-27 DIAGNOSIS — Y9301 Activity, walking, marching and hiking: Secondary | ICD-10-CM | POA: Insufficient documentation

## 2021-03-27 DIAGNOSIS — I110 Hypertensive heart disease with heart failure: Secondary | ICD-10-CM | POA: Diagnosis not present

## 2021-03-27 DIAGNOSIS — Z7902 Long term (current) use of antithrombotics/antiplatelets: Secondary | ICD-10-CM | POA: Diagnosis not present

## 2021-03-27 DIAGNOSIS — Z87891 Personal history of nicotine dependence: Secondary | ICD-10-CM | POA: Diagnosis not present

## 2021-03-27 DIAGNOSIS — S7292XA Unspecified fracture of left femur, initial encounter for closed fracture: Secondary | ICD-10-CM | POA: Insufficient documentation

## 2021-03-27 DIAGNOSIS — M7989 Other specified soft tissue disorders: Secondary | ICD-10-CM | POA: Diagnosis not present

## 2021-03-27 DIAGNOSIS — I5043 Acute on chronic combined systolic (congestive) and diastolic (congestive) heart failure: Secondary | ICD-10-CM | POA: Insufficient documentation

## 2021-03-27 DIAGNOSIS — R102 Pelvic and perineal pain: Secondary | ICD-10-CM | POA: Diagnosis not present

## 2021-03-27 DIAGNOSIS — Z96653 Presence of artificial knee joint, bilateral: Secondary | ICD-10-CM | POA: Insufficient documentation

## 2021-03-27 DIAGNOSIS — S7002XA Contusion of left hip, initial encounter: Secondary | ICD-10-CM

## 2021-03-27 DIAGNOSIS — Z79899 Other long term (current) drug therapy: Secondary | ICD-10-CM | POA: Diagnosis not present

## 2021-03-27 DIAGNOSIS — Z955 Presence of coronary angioplasty implant and graft: Secondary | ICD-10-CM | POA: Diagnosis not present

## 2021-03-27 DIAGNOSIS — M25552 Pain in left hip: Secondary | ICD-10-CM | POA: Diagnosis not present

## 2021-03-27 DIAGNOSIS — W101XXA Fall (on)(from) sidewalk curb, initial encounter: Secondary | ICD-10-CM | POA: Insufficient documentation

## 2021-03-27 DIAGNOSIS — I2510 Atherosclerotic heart disease of native coronary artery without angina pectoris: Secondary | ICD-10-CM | POA: Diagnosis not present

## 2021-03-27 DIAGNOSIS — T148XXA Other injury of unspecified body region, initial encounter: Secondary | ICD-10-CM

## 2021-03-27 DIAGNOSIS — Y92511 Restaurant or cafe as the place of occurrence of the external cause: Secondary | ICD-10-CM | POA: Insufficient documentation

## 2021-03-27 DIAGNOSIS — S50312A Abrasion of left elbow, initial encounter: Secondary | ICD-10-CM | POA: Diagnosis not present

## 2021-03-27 DIAGNOSIS — S79912A Unspecified injury of left hip, initial encounter: Secondary | ICD-10-CM | POA: Diagnosis present

## 2021-03-27 NOTE — ED Notes (Signed)
Patient ambulated to the restroom without any assistance.  

## 2021-03-27 NOTE — ED Triage Notes (Signed)
Complains of a fall last night, states he felt like his L hip gave out on him, now there is pain and swelling, landed on L hip and elbow.

## 2021-03-27 NOTE — ED Notes (Signed)
Ambulatory to bathroom. Gait steady. No signs of distress.  °

## 2021-03-27 NOTE — Discharge Instructions (Addendum)
Use Tylenol for pain.  Clean the wound on your left elbow, twice a day with soap and water.  Keep a bandage on the wound until it heals.  See your doctor as needed for problems.

## 2021-03-27 NOTE — ED Provider Notes (Signed)
[facility] DEPT Provider Note   CSN: [id number] Arrival date & time: 03/27/21  5465     History No chief complaint on file.   [name] is a 80 y.o. male.  HPI Is here for evaluation of injury that occurred yesterday.  He was walking out of a restaurant, when he stepped off a curb and felt his left hip give way.  This caused him to fall onto the ground and skinned his left elbow.  He did not hurt his hips in the fall.  He was able to get up with assistance.  He went home and put a dressing on the wound.  He described to come here today because of persistent pain in the left elbow and left hip.  He denies injury to head, neck or back.  States his last tetanus booster was 5 years ago.  There are no other known active modifying factors.    Past Medical History:  Diagnosis Date   Adenomatous colon polyp    Arthritis    CAD (coronary artery disease)    a. MI in 1996 with stent placement b. cath 01/18/17 - S/p PCI of in-stent restenosis of pLAD with cutting ballon & DES; 20% ostial LAD; 40% pro Cx; 60% focal pRCA   Clostridium difficile infection    Depression    Diverticulosis    H. pylori infection    HTN (hypertension)    Hyperlipidemia    MI (myocardial infarction) [facility])     Patient Active Problem List   Diagnosis Date Noted   Coagulation disorder ([place]) 03/20/2019   Acute duodenal ulcer with hemorrhage    Acute GI bleeding 06/13/2018   Gastrointestinal hemorrhage with melena    Acute blood loss anemia    Chest pain    Status post coronary artery stent placement: a. (01/18/17) PCI and DES to LAD, EF 40-45%    Acute on chronic combined systolic and diastolic CHF (congestive heart failure) ([place])    Progressive angina ([place])    Coronary artery disease 01/17/2017   Knee pain 09/13/2012   S/P TKR (total knee replacement) 09/13/2012   Dizziness 12/14/2011   Constipation 08/30/2011   HEADACHE 12/29/2008   Hyperlipidemia 12/25/2008   DEPRESSION  12/25/2008   Hypertension 12/25/2008   MYOCARDIAL INFARCTION, HX OF 12/25/2008   Coronary atherosclerosis 12/25/2008   ARTHRITIS 12/25/2008    Past Surgical History:  Procedure Laterality Date   [place]   benign tumor in back of head, done at  [facility]     hx of [place]; 1997; 01/18/2017   CORONARY STENT INTERVENTION N/A 01/18/2017   Procedure: CORONARY STENT INTERVENTION;  Surgeon: Troy Sine, MD;  Location: [facility];  Service: Cardiovascular;  Laterality: N/A;   ESOPHAGOGASTRODUODENOSCOPY (EGD) WITH PROPOFOL N/A 06/14/2018   Procedure: ESOPHAGOGASTRODUODENOSCOPY (EGD) WITH PROPOFOL;  Surgeon: Gatha Mayer, MD;  Location: [facility];  Service: Endoscopy;  Laterality: N/A;   INTRAVASCULAR PRESSURE WIRE/FFR STUDY N/A 07/28/2020   Procedure: INTRAVASCULAR PRESSURE WIRE/FFR STUDY;  Surgeon: Leonie Man, MD;  Location: [facility] CV LAB;  Service: Cardiovascular;  Laterality: N/A;   JOINT REPLACEMENT     LEFT HEART CATH AND CORONARY ANGIOGRAPHY N/A 01/18/2017   Procedure: LEFT HEART CATH AND CORONARY ANGIOGRAPHY;  Surgeon: Troy Sine, MD;  Location: [place] [facility];  Service: Cardiovascular;  Laterality: N/A;  LEFT HEART CATH AND CORONARY ANGIOGRAPHY N/A 07/28/2020   Procedure: LEFT HEART CATH AND CORONARY ANGIOGRAPHY;  Surgeon: Leonie Man, MD;  Location: [facility];  Service: Cardiovascular;  Laterality: N/A;   stent x2 in the LAD  with intra-aortic balloon pump support  1996   TONSILLECTOMY     TOTAL KNEE ARTHROPLASTY Bilateral        Family History  Problem Relation Age of Onset   Heart attack Mother        18s   Heart attack Brother 48   Cancer Sister        unsure of type   Colon cancer Neg Hx    Stomach cancer Neg Hx    Rectal cancer Neg Hx    Esophageal cancer Neg Hx     Social History   Tobacco Use   Smoking status:  Former    Types: Cigarettes    Quit date: 06/06/1994    Years since quitting: 26.8   Smokeless tobacco: Never  Vaping Use   Vaping Use: Never used  Substance Use Topics   Alcohol use: No   Drug use: No    Home Medications Prior to Admission medications   Medication Sig Start Date End Date Taking? Authorizing Provider  Aspirin-Salicylamide-Caffeine (BC FAST PAIN RELIEF) 650-195-33.3 MG PACK Take 1 packet by mouth daily as needed (pain.).   Yes [provider]  atorvastatin (LIPITOR) 80 MG tablet Take 80 mg by mouth daily.   Yes [provider]  clopidogrel (PLAVIX) 75 MG tablet Take 1 tablet (75 mg total) by mouth daily. 01/27/21  Yes Nahser, Wonda Cheng, MD  Cobalamin Combinations (B12 FOLATE PO) Take 1 tablet by mouth daily.   Yes [provider]  escitalopram (LEXAPRO) 20 MG tablet Take 20 mg by mouth daily. 05/13/18  Yes [provider]  esomeprazole (NEXIUM) 40 MG capsule Take 40 mg by mouth daily before breakfast.   Yes [provider]  ezetimibe (ZETIA) 10 MG tablet Take 10 mg by mouth daily. 02/03/21  Yes [provider]  fenofibrate (TRICOR) 145 MG tablet Take 1 tablet (145 mg total) by mouth daily. 01/27/21  Yes Nahser, Wonda Cheng, MD  fenofibrate (TRICOR) 145 MG tablet Take 145 mg by mouth daily.   Yes [provider]  furosemide (LASIX) 20 MG tablet Take 1 tablet (20 mg total) by mouth daily. 09/16/20 03/27/21 Yes Weaver, Scott T, PA-C  HYDROcodone-acetaminophen (NORCO/VICODIN) 5-325 MG tablet Take 1 tablet by mouth every 4 (four) hours as needed for moderate pain. 02/03/21  Yes [provider]  isosorbide mononitrate (IMDUR) 30 MG 24 hr tablet Take 1.5 tablets (45 mg total) by mouth daily. 01/27/21  Yes Nahser, Wonda Cheng, MD  meloxicam (MOBIC) 7.5 MG tablet Take 7.5 mg by mouth daily. 09/24/18  Yes [provider]  neomycin-polymyxin b-dexamethasone (MAXITROL) 3.5-10000-0.1 SUSP Place 1 drop into the right eye 4  (four) times daily. 03/03/21  Yes [provider]  nitroGLYCERIN (NITROSTAT) 0.4 MG SL tablet Place 1 tablet (0.4 mg total) under the tongue every 5 (five) minutes as needed for chest pain. 07/24/20  Yes Nahser, Wonda Cheng, MD  pantoprazole (PROTONIX) 40 MG tablet Take 40 mg by mouth daily.   Yes [provider]    Allergies    Gabapentin  Review of Systems   Review of Systems  All other systems reviewed and are negative.  Physical Exam Updated Vital Signs BP (!) 150/92 (BP Location: Right Arm)  Pulse 65   Temp 98 F (36.7 C) (Oral)   Resp 17   Ht 5\' 9"  (1.753 m)   Wt 99.8 kg   SpO2 96%   BMI 32.49 kg/m   Physical Exam Vitals and nursing note reviewed.  Constitutional:      General: He is not in acute distress.    Appearance: He is well-developed. He is not ill-appearing, toxic-appearing or diaphoretic.  HENT:     Head: Normocephalic and atraumatic.     Right Ear: External ear normal.     Left Ear: External ear normal.     Nose: No congestion.     Mouth/Throat:     Mouth: Mucous membranes are moist.  Eyes:     Conjunctiva/sclera: Conjunctivae normal.     Pupils: Pupils are equal, round, and reactive to light.  Neck:     Trachea: Phonation normal.  Cardiovascular:     Rate and Rhythm: Normal rate.  Pulmonary:     Effort: Pulmonary effort is normal.  Chest:     Chest wall: No tenderness.  Abdominal:     General: There is no distension.     Palpations: Abdomen is soft.     Tenderness: There is no abdominal tenderness.  Musculoskeletal:     Cervical back: Normal range of motion and neck supple.     Comments: He guards against movement of the left hip but is able to move in a fairly normal manner.  Mild tenderness left posterior hip region without significant contusion or hematoma.  Abrasion left elbow, with skin, sloughed off.  This wound is skin splitting and exposes intact dermis.  Skin:    General: Skin is warm and dry.  Neurological:     Mental  Status: He is alert and oriented to person, place, and time.     Cranial Nerves: No cranial nerve deficit.     Sensory: No sensory deficit.     Motor: No abnormal muscle tone.     Coordination: Coordination normal.  Psychiatric:        Mood and Affect: Mood normal.        Behavior: Behavior normal.        Thought Content: Thought content normal.        Judgment: Judgment normal.    ED Results / Procedures / Treatments   Labs (all labs ordered are listed, but only abnormal results are displayed) Labs Reviewed - No data to display  EKG None  Radiology DG Pelvis 1-2 Views  Result Date: 03/27/2021 CLINICAL DATA:  Fall.  Pain.  Patient fell on LEFT hip last night. EXAM: PELVIS - 1-2 VIEW COMPARISON:  CT of the abdomen and pelvis on 05/24/2006 FINDINGS: There are degenerative changes in both hips. Along the LATERAL aspect of the LEFT proximal femoral neck, there is possible step-off and lucency raising the question of a minimally displaced fracture. Pelvic ring is intact. Visualized bowel gas pattern is nonobstructive. IMPRESSION: Question of LEFT hip fracture.  Recommend dedicated LEFT hip views. Electronically Signed   By: Nolon Nations M.D.   On: 03/27/2021 10:43   DG HIP UNILAT WITH PELVIS 2-3 VIEWS LEFT  Result Date: 03/27/2021 CLINICAL DATA:  Patient reports fall last night, now with pain and swelling of the left hip. Evaluate for fracture. EXAM: DG HIP (WITH OR WITHOUT PELVIS) 2-3V LEFT COMPARISON:  CT abdomen pelvis 07/10/2014 FINDINGS: There is no evidence of hip fracture or dislocation. Mild osteoarthritis of the hip joints. IMPRESSION: Negative. Electronically Signed  By: Ileana Roup M.D.   On: 03/27/2021 12:11    Procedures Procedures   Medications Ordered in ED Medications - No data to display  ED Course  I have reviewed the triage vital signs and the nursing notes.  Pertinent labs & imaging results that were available during my care of the patient were reviewed by  me and considered in my medical decision making (see chart for details).    MDM Rules/Calculators/A&P                            Patient Vitals for the past 24 hrs:  BP Temp Temp src Pulse Resp SpO2 Height Weight  03/27/21 1420 (!) 150/92 98 F (36.7 C) Oral 65 17 96 % -- --  03/27/21 1300 (!) 138/96 -- -- 65 16 96 % -- --  03/27/21 1200 133/90 -- -- 63 16 98 % -- --  03/27/21 1029 117/67 -- -- 62 20 95 % -- --  03/27/21 1000 117/67 -- -- 70 18 94 % -- --  03/27/21 0908 (!) 144/77 98.7 F (37.1 C) Oral 69 18 94 % -- --  03/27/21 0906 -- -- -- -- -- -- 5\' 9"  (1.753 m) 99.8 kg    12:43 PM Reevaluation with update and discussion. After initial assessment and treatment, an updated evaluation reveals no additional complaints.  Radiology findings returned.  Will attempt ambulation and discharge if stable. Daleen Bo   Medical Decision Making:  This patient is presenting for evaluation of fall which occurred yesterday, which does require a range of treatment options, and is a complaint that involves a moderate risk of morbidity and mortality. The differential diagnoses include contusion, fracture, soft tissue injury. I decided to review old records, and in summary elderly male, fell yesterday and was able to get up with assistance and presents today amatory, driving his own vehicle for evaluation.  I did not require additional historical information from anyone.   Radiologic Tests Ordered, included pelvis x-ray.  I independently Visualized: Radiographic images, which show no fracture or deformity.  No dislocation.   Critical Interventions-clinical evaluation, wound care by nursing with my assistance to debride skin.  Radiography, observation and reassessment  After These Interventions, the Patient was reevaluated and was found stable for discharge.  Doubt fracture, significant contusion, or etiology that would stimulate fall other than mechanical.  He is stable for discharge with  symptomatic treatment for  CRITICAL CARE-no Performed by: Daleen Bo  Nursing Notes Reviewed/ Care Coordinated Applicable Imaging Reviewed Interpretation of Laboratory Data incorporated into ED treatment  The patient appears reasonably screened and/or stabilized for discharge and I doubt any other medical condition or other [facility] requiring further screening, evaluation, or treatment in the ED at this time prior to discharge.  Plan: Home Medications-continue usual insulin use Tylenol for pain; Home Treatments-wound care at home; return here if the recommended treatment, does not improve the symptoms; Recommended follow up-PCP [place]     Final Clinical Impression(s) / ED Diagnoses Final diagnoses:  Fracture  Contusion of left hip, initial encounter  Abrasion of left elbow, initial encounter    Rx / DC Orders ED Discharge Orders     None        Daleen Bo, MD 03/28/21 [phone]

## 2021-03-30 DIAGNOSIS — M25552 Pain in left hip: Secondary | ICD-10-CM | POA: Diagnosis not present

## 2021-04-05 DIAGNOSIS — I130 Hypertensive heart and chronic kidney disease with heart failure and stage 1 through stage 4 chronic kidney disease, or unspecified chronic kidney disease: Secondary | ICD-10-CM | POA: Diagnosis not present

## 2021-04-05 DIAGNOSIS — E1122 Type 2 diabetes mellitus with diabetic chronic kidney disease: Secondary | ICD-10-CM | POA: Diagnosis not present

## 2021-04-05 DIAGNOSIS — I5022 Chronic systolic (congestive) heart failure: Secondary | ICD-10-CM | POA: Diagnosis not present

## 2021-04-05 DIAGNOSIS — M25552 Pain in left hip: Secondary | ICD-10-CM | POA: Diagnosis not present

## 2021-04-05 DIAGNOSIS — N1831 Chronic kidney disease, stage 3a: Secondary | ICD-10-CM | POA: Diagnosis not present

## 2021-04-08 ENCOUNTER — Telehealth: Payer: Self-pay | Admitting: *Deleted

## 2021-04-08 NOTE — Telephone Encounter (Signed)
   [facility] Pre-operative Risk Assessment    Patient Name: [name]  DOB: 28-Jan-1941 MRN: [id number]  HEARTCARE STAFF:  - IMPORTANT!!!!!! Under Visit Info/Reason for Call, type in Other and utilize the format Clearance MM/DD/YY or Clearance TBD. Do not use dashes or single digits. - Please review there is not already an duplicate clearance open for this procedure. - If request is for dental extraction, please clarify the # of teeth to be extracted. - If the patient is currently at the dentist's office, call Pre-Op Callback Staff (MA/nurse) to input urgent request.  - If the patient is not currently in the dentist office, please route to the Pre-Op pool.  Request for surgical clearance:  What type of surgery is being performed? Lumbar ESI  When is this surgery scheduled? 04/19/21  What type of clearance is required (medical clearance vs. Pharmacy clearance to hold med vs. Both)? Both  Are there any medications that need to be held prior to surgery and how long? Plavix 7 days  Practice name and name of physician performing surgery? [facility], physician not listed  What is the office phone number? [phone]   7.   What is the office fax number? [phone]  8.   Anesthesia type (None, local, MAC, general) ? Not listed   Juventino Slovak 04/08/2021, 5:43 PM  _________________________________________________________________   (provider comments below)

## 2021-04-09 NOTE — Telephone Encounter (Signed)
Mr. [name] is returning call to Pre Op Team

## 2021-04-09 NOTE — Telephone Encounter (Signed)
   Name: [name]  DOB: 07/30/1940  MRN: [id number]   Primary Cardiologist: [name], MD  Chart reviewed as part of pre-operative protocol coverage. Patient was contacted 04/09/2021 in reference to pre-operative risk assessment for pending surgery as outlined below.  ARAM DOMZALSKI was last seen on 01/25/21 by Dr. Acie Fredrickson.  Since that day, DEVONDRE GUZZETTA has done fine from a cardiac standpoint.  He has chronic stable fatigue, but no issues with chest pain or shortness of breath.  Activity is limited by back and knee pain.  He is still able to complete 4 METS without anginal complaints.  Therefore, based on ACC/AHA guidelines, the patient would be at acceptable risk for the planned procedure without further cardiovascular testing.   The patient was advised that if he develops new symptoms prior to surgery to contact our office to arrange for a follow-up visit, and he verbalized understanding.  Per previous recommendation by Dr. Acie Fredrickson, patient can hold Plavix 5 days prior to his upcoming procedure with plans to restart as soon as he is cleared to do so by his surgeon.  I will route this recommendation to the requesting party via Epic fax function and remove from pre-op pool. Please call with questions.  Abigail Butts, PA-C 04/09/2021, 2:23 PM

## 2021-04-09 NOTE — Telephone Encounter (Signed)
    Patient Name: [name]  DOB: 05/22/1941 MRN: [id number]  Primary Cardiologist: [name], MD  Chart reviewed as part of pre-operative protocol coverage.   Left a voicemail for patient to call back for ongoing preoperative evaluation.  Abigail Butts, PA-C 04/09/2021, 1:27 PM

## 2021-04-27 DIAGNOSIS — M5416 Radiculopathy, lumbar region: Secondary | ICD-10-CM | POA: Diagnosis not present

## 2021-05-11 ENCOUNTER — Encounter: Payer: Self-pay | Admitting: Podiatry

## 2021-05-11 ENCOUNTER — Other Ambulatory Visit: Payer: Self-pay

## 2021-05-11 ENCOUNTER — Ambulatory Visit (INDEPENDENT_AMBULATORY_CARE_PROVIDER_SITE_OTHER): Payer: Medicare Other | Admitting: Podiatry

## 2021-05-11 DIAGNOSIS — M79609 Pain in unspecified limb: Secondary | ICD-10-CM

## 2021-05-11 DIAGNOSIS — D689 Coagulation defect, unspecified: Secondary | ICD-10-CM | POA: Diagnosis not present

## 2021-05-11 DIAGNOSIS — B351 Tinea unguium: Secondary | ICD-10-CM | POA: Diagnosis not present

## 2021-05-11 NOTE — Progress Notes (Signed)
This patient returns to my office for at risk foot care.  This patient requires this care by a professional since this patient will be at risk due to having coagulation defect.  Patient is taking plavix.  This patient is unable to cut nails himself since the patient cannot reach his nails.These nails are painful walking and wearing shoes.  This patient presents for at risk foot care today.  General Appearance  Alert, conversant and in no acute stress.  Vascular  Dorsalis pedis and posterior tibial  pulses are palpable  bilaterally.  Capillary return is within normal limits  bilaterally. Temperature is within normal limits  bilaterally.  Neurologic  Senn-Weinstein monofilament wire test within normal limits  bilaterally. Muscle power within normal limits bilaterally.  Nails Thick disfigured discolored nails with subungual debris  from hallux to fifth toes bilaterally. No evidence of bacterial infection or drainage bilaterally.  Orthopedic  No limitations of motion  feet .  No crepitus or effusions noted.  No bony pathology or digital deformities noted.  Skin  normotropic skin with no porokeratosis noted bilaterally.  No signs of infections or ulcers noted.     Onychomycosis  Pain in right toes  Pain in left toes  Consent was obtained for treatment procedures.   Mechanical debridement of nails 1-5  bilaterally performed with a nail nipper.  Filed with dremel without incident.    Return office visit    3 months                  Told patient to return for periodic foot care and evaluation due to potential at risk complications.   Kyron Schlitt DPM  

## 2021-05-13 DIAGNOSIS — N182 Chronic kidney disease, stage 2 (mild): Secondary | ICD-10-CM | POA: Diagnosis not present

## 2021-05-13 DIAGNOSIS — Z0000 Encounter for general adult medical examination without abnormal findings: Secondary | ICD-10-CM | POA: Diagnosis not present

## 2021-05-13 DIAGNOSIS — I209 Angina pectoris, unspecified: Secondary | ICD-10-CM | POA: Diagnosis not present

## 2021-05-13 DIAGNOSIS — E782 Mixed hyperlipidemia: Secondary | ICD-10-CM | POA: Diagnosis not present

## 2021-05-13 DIAGNOSIS — R269 Unspecified abnormalities of gait and mobility: Secondary | ICD-10-CM | POA: Diagnosis not present

## 2021-05-13 DIAGNOSIS — I130 Hypertensive heart and chronic kidney disease with heart failure and stage 1 through stage 4 chronic kidney disease, or unspecified chronic kidney disease: Secondary | ICD-10-CM | POA: Diagnosis not present

## 2021-05-13 DIAGNOSIS — E1122 Type 2 diabetes mellitus with diabetic chronic kidney disease: Secondary | ICD-10-CM | POA: Diagnosis not present

## 2021-05-13 DIAGNOSIS — R5383 Other fatigue: Secondary | ICD-10-CM | POA: Diagnosis not present

## 2021-05-13 DIAGNOSIS — R519 Headache, unspecified: Secondary | ICD-10-CM | POA: Diagnosis not present

## 2021-05-14 ENCOUNTER — Other Ambulatory Visit: Payer: Self-pay | Admitting: Internal Medicine

## 2021-05-14 DIAGNOSIS — R519 Headache, unspecified: Secondary | ICD-10-CM

## 2021-05-20 DIAGNOSIS — N1831 Chronic kidney disease, stage 3a: Secondary | ICD-10-CM | POA: Diagnosis not present

## 2021-05-20 DIAGNOSIS — I5022 Chronic systolic (congestive) heart failure: Secondary | ICD-10-CM | POA: Diagnosis not present

## 2021-05-20 DIAGNOSIS — N182 Chronic kidney disease, stage 2 (mild): Secondary | ICD-10-CM | POA: Diagnosis not present

## 2021-05-20 DIAGNOSIS — I130 Hypertensive heart and chronic kidney disease with heart failure and stage 1 through stage 4 chronic kidney disease, or unspecified chronic kidney disease: Secondary | ICD-10-CM | POA: Diagnosis not present

## 2021-05-20 DIAGNOSIS — I25118 Atherosclerotic heart disease of native coronary artery with other forms of angina pectoris: Secondary | ICD-10-CM | POA: Diagnosis not present

## 2021-05-20 DIAGNOSIS — E1122 Type 2 diabetes mellitus with diabetic chronic kidney disease: Secondary | ICD-10-CM | POA: Diagnosis not present

## 2021-06-04 ENCOUNTER — Ambulatory Visit
Admission: RE | Admit: 2021-06-04 | Discharge: 2021-06-04 | Disposition: A | Discharge status: Home / Self Care | Payer: Medicare Other | Source: Ambulatory Visit | Attending: Internal Medicine | Admitting: Internal Medicine

## 2021-06-04 DIAGNOSIS — R519 Headache, unspecified: Secondary | ICD-10-CM | POA: Diagnosis not present

## 2021-06-04 DIAGNOSIS — N1831 Chronic kidney disease, stage 3a: Secondary | ICD-10-CM | POA: Diagnosis not present

## 2021-06-04 DIAGNOSIS — I130 Hypertensive heart and chronic kidney disease with heart failure and stage 1 through stage 4 chronic kidney disease, or unspecified chronic kidney disease: Secondary | ICD-10-CM | POA: Diagnosis not present

## 2021-06-04 DIAGNOSIS — I5022 Chronic systolic (congestive) heart failure: Secondary | ICD-10-CM | POA: Diagnosis not present

## 2021-06-04 DIAGNOSIS — E1122 Type 2 diabetes mellitus with diabetic chronic kidney disease: Secondary | ICD-10-CM | POA: Diagnosis not present

## 2021-06-09 ENCOUNTER — Telehealth: Payer: Self-pay | Admitting: Cardiovascular Disease

## 2021-06-09 NOTE — Telephone Encounter (Signed)
Attempted to call Pt's son back.  Dr. Acie Fredrickson Pt.  Please send call to any available triage nurse.

## 2021-06-09 NOTE — Telephone Encounter (Signed)
Call received from [facility] requesting Pt be made sooner appt for weight gain, sob with exertion and indigestion.  Attempted to contact Pt and Pt's son per DPR.  Unable to leave a message on either VM.  Will try to call again later.

## 2021-06-09 NOTE — Telephone Encounter (Signed)
Pt c/o swelling: STAT is pt has developed SOB within 24 hours  If swelling, where is the swelling located? N/A  How much weight have you gained and in what time span? 5 lbs in a week and a half  Have you gained 3 pounds in a day or 5 pounds in a week? N/A  Do you have a log of your daily weights (if so, list)? N/A  Are you currently taking a fluid pill? N/A  Are you currently SOB? Reports she could not hear over the phone, but patient reported having SOB upon exertion  Have you traveled recently? N/A   Patient called into [facility] reporting weight gain, SOB upon exertion, as well as indigestion. Raford Pitcher is requesting a callback to get patient worked in for a sooner appointment.

## 2021-06-10 ENCOUNTER — Telehealth: Payer: Self-pay | Admitting: Cardiovascular Disease

## 2021-06-10 NOTE — Telephone Encounter (Signed)
Patient is calling back to talk to triage

## 2021-06-10 NOTE — Telephone Encounter (Signed)
Patient is returning phone call to triage

## 2021-06-10 NOTE — Telephone Encounter (Signed)
Pt states he spoke with [facility] and mentioned some things to them and they decided to call.  States he has noticed SOB when exerting but it resolves quickly with rest.  States the SOB isn't bad and he has been having acid reflux issues as well.  States he noticed he gained 3-4lbs in a week but was back down a one pound this morning.  Denies swelling.  Not currently taking a diuretic.  Doesn't have readings with him but states BP and HR are always fine.  Denies increased salt intake.  Advised I will send message to Dr. Acie Fredrickson for review and see if he feels like pt needs to get in before his appt 2/20.  Pt appreciative for call.

## 2021-06-10 NOTE — Telephone Encounter (Signed)
See other phone note that was started in regards to same issue

## 2021-06-10 NOTE — Telephone Encounter (Signed)
Left message to call back  

## 2021-06-11 NOTE — Telephone Encounter (Signed)
Attempted phone call to pt again.  Message again states unable to complete call at this time.

## 2021-06-11 NOTE — Telephone Encounter (Signed)
Attempted phone call to pt.  Message states unable to complete call at this time.    Cath in feb. 2022 showed mild - mod cad  Echo shows normal LV systolic function  Mild diastolic dysfunction  Lets have him try furosemide 40 mg a day for 3 days   and Kdur 20 meq a day for 3 days  See if this helps.  He should let us know if this helped or not   If it helps, he should continue the lasix and kdur 3 days a week ( M,W,F)  Check BMP in 3 weeks

## 2021-06-14 NOTE — Telephone Encounter (Signed)
Attempted to call pt again with Dr. Elmarie Shiley recommendations. Voicemail present at this time and did leave a message to call our office back. Judson Roch, RN

## 2021-06-15 NOTE — Telephone Encounter (Signed)
° °  Pt is returning call, he said he was offered an appt to see Dr. Acie Fredrickson this Friday, he asked if he can still get that

## 2021-06-15 NOTE — Telephone Encounter (Signed)
I placed call to number listed as incoming caller and there was no answer.  Left message on number listed for patient to call office.

## 2021-06-22 NOTE — Telephone Encounter (Signed)
Pt has scheduled an appointment with Dr Acie Fredrickson for 07/26/2021.  Attempted phone call to pt and left voicemail message to contact triage for any further questions or concerns.

## 2021-07-01 ENCOUNTER — Ambulatory Visit: Payer: Medicare Other | Admitting: Nurse Practitioner

## 2021-07-06 DIAGNOSIS — I130 Hypertensive heart and chronic kidney disease with heart failure and stage 1 through stage 4 chronic kidney disease, or unspecified chronic kidney disease: Secondary | ICD-10-CM | POA: Diagnosis not present

## 2021-07-06 DIAGNOSIS — I5022 Chronic systolic (congestive) heart failure: Secondary | ICD-10-CM | POA: Diagnosis not present

## 2021-07-06 DIAGNOSIS — E1122 Type 2 diabetes mellitus with diabetic chronic kidney disease: Secondary | ICD-10-CM | POA: Diagnosis not present

## 2021-07-06 DIAGNOSIS — N1831 Chronic kidney disease, stage 3a: Secondary | ICD-10-CM | POA: Diagnosis not present

## 2021-07-25 ENCOUNTER — Encounter: Payer: Self-pay | Admitting: Cardiovascular Disease

## 2021-07-25 NOTE — Progress Notes (Signed)
This encounter was created in error - please disregard.

## 2021-07-26 ENCOUNTER — Encounter: Payer: Medicare Other | Admitting: Cardiovascular Disease

## 2021-07-30 ENCOUNTER — Ambulatory Visit: Payer: Medicare Other | Admitting: Orthopaedic Surgery

## 2021-08-03 DIAGNOSIS — N1831 Chronic kidney disease, stage 3a: Secondary | ICD-10-CM | POA: Diagnosis not present

## 2021-08-03 DIAGNOSIS — I130 Hypertensive heart and chronic kidney disease with heart failure and stage 1 through stage 4 chronic kidney disease, or unspecified chronic kidney disease: Secondary | ICD-10-CM | POA: Diagnosis not present

## 2021-08-03 DIAGNOSIS — I5022 Chronic systolic (congestive) heart failure: Secondary | ICD-10-CM | POA: Diagnosis not present

## 2021-08-03 DIAGNOSIS — E1122 Type 2 diabetes mellitus with diabetic chronic kidney disease: Secondary | ICD-10-CM | POA: Diagnosis not present

## 2021-08-07 DIAGNOSIS — M4180 Other forms of scoliosis, site unspecified: Secondary | ICD-10-CM | POA: Diagnosis not present

## 2021-08-07 DIAGNOSIS — D6869 Other thrombophilia: Secondary | ICD-10-CM | POA: Diagnosis not present

## 2021-08-07 DIAGNOSIS — M5416 Radiculopathy, lumbar region: Secondary | ICD-10-CM | POA: Diagnosis not present

## 2021-08-07 DIAGNOSIS — I4891 Unspecified atrial fibrillation: Secondary | ICD-10-CM | POA: Diagnosis not present

## 2021-08-09 ENCOUNTER — Encounter: Payer: Self-pay | Admitting: Gastroenterology

## 2021-08-10 ENCOUNTER — Ambulatory Visit: Payer: Medicare Other | Admitting: Podiatry

## 2021-08-12 DIAGNOSIS — M47816 Spondylosis without myelopathy or radiculopathy, lumbar region: Secondary | ICD-10-CM | POA: Diagnosis not present

## 2021-08-19 DIAGNOSIS — E1122 Type 2 diabetes mellitus with diabetic chronic kidney disease: Secondary | ICD-10-CM | POA: Diagnosis not present

## 2021-08-19 DIAGNOSIS — N182 Chronic kidney disease, stage 2 (mild): Secondary | ICD-10-CM | POA: Diagnosis not present

## 2021-08-19 DIAGNOSIS — I130 Hypertensive heart and chronic kidney disease with heart failure and stage 1 through stage 4 chronic kidney disease, or unspecified chronic kidney disease: Secondary | ICD-10-CM | POA: Diagnosis not present

## 2021-08-19 DIAGNOSIS — I5022 Chronic systolic (congestive) heart failure: Secondary | ICD-10-CM | POA: Diagnosis not present

## 2021-08-19 DIAGNOSIS — I25118 Atherosclerotic heart disease of native coronary artery with other forms of angina pectoris: Secondary | ICD-10-CM | POA: Diagnosis not present

## 2021-08-19 DIAGNOSIS — N1831 Chronic kidney disease, stage 3a: Secondary | ICD-10-CM | POA: Diagnosis not present

## 2021-08-26 DIAGNOSIS — I5022 Chronic systolic (congestive) heart failure: Secondary | ICD-10-CM | POA: Diagnosis not present

## 2021-08-26 DIAGNOSIS — I209 Angina pectoris, unspecified: Secondary | ICD-10-CM | POA: Diagnosis not present

## 2021-08-26 DIAGNOSIS — E1122 Type 2 diabetes mellitus with diabetic chronic kidney disease: Secondary | ICD-10-CM | POA: Diagnosis not present

## 2021-08-26 DIAGNOSIS — R490 Dysphonia: Secondary | ICD-10-CM | POA: Diagnosis not present

## 2021-08-26 DIAGNOSIS — N1831 Chronic kidney disease, stage 3a: Secondary | ICD-10-CM | POA: Diagnosis not present

## 2021-08-26 DIAGNOSIS — I25118 Atherosclerotic heart disease of native coronary artery with other forms of angina pectoris: Secondary | ICD-10-CM | POA: Diagnosis not present

## 2021-08-26 DIAGNOSIS — I130 Hypertensive heart and chronic kidney disease with heart failure and stage 1 through stage 4 chronic kidney disease, or unspecified chronic kidney disease: Secondary | ICD-10-CM | POA: Diagnosis not present

## 2021-08-31 ENCOUNTER — Encounter: Payer: Self-pay | Admitting: Podiatry

## 2021-08-31 ENCOUNTER — Other Ambulatory Visit: Payer: Self-pay

## 2021-08-31 ENCOUNTER — Ambulatory Visit (INDEPENDENT_AMBULATORY_CARE_PROVIDER_SITE_OTHER): Payer: Medicare Other | Admitting: Podiatry

## 2021-08-31 DIAGNOSIS — D689 Coagulation defect, unspecified: Secondary | ICD-10-CM | POA: Diagnosis not present

## 2021-08-31 DIAGNOSIS — B351 Tinea unguium: Secondary | ICD-10-CM

## 2021-08-31 DIAGNOSIS — M79609 Pain in unspecified limb: Secondary | ICD-10-CM | POA: Diagnosis not present

## 2021-08-31 NOTE — Progress Notes (Signed)
This patient returns to my office for at risk foot care.  This patient requires this care by a professional since this patient will be at risk due to having coagulation defect.  Patient is taking plavix.  This patient is unable to cut nails himself since the patient cannot reach his nails.These nails are painful walking and wearing shoes.  This patient presents for at risk foot care today.  General Appearance  Alert, conversant and in no acute stress.  Vascular  Dorsalis pedis and posterior tibial  pulses are palpable  bilaterally.  Capillary return is within normal limits  bilaterally. Temperature is within normal limits  bilaterally.  Neurologic  Senn-Weinstein monofilament wire test within normal limits  bilaterally. Muscle power within normal limits bilaterally.  Nails Thick disfigured discolored nails with subungual debris  from hallux to fifth toes bilaterally. No evidence of bacterial infection or drainage bilaterally.  Orthopedic  No limitations of motion  feet .  No crepitus or effusions noted.  No bony pathology or digital deformities noted.  Skin  normotropic skin with no porokeratosis noted bilaterally.  No signs of infections or ulcers noted.     Onychomycosis  Pain in right toes  Pain in left toes  Consent was obtained for treatment procedures.   Mechanical debridement of nails 1-5  bilaterally performed with a nail nipper.  Filed with dremel without incident.    Return office visit    3 months                  Told patient to return for periodic foot care and evaluation due to potential at risk complications.   Bearl Talarico DPM  

## 2021-09-03 DIAGNOSIS — I130 Hypertensive heart and chronic kidney disease with heart failure and stage 1 through stage 4 chronic kidney disease, or unspecified chronic kidney disease: Secondary | ICD-10-CM | POA: Diagnosis not present

## 2021-09-03 DIAGNOSIS — E1122 Type 2 diabetes mellitus with diabetic chronic kidney disease: Secondary | ICD-10-CM | POA: Diagnosis not present

## 2021-09-03 DIAGNOSIS — N1831 Chronic kidney disease, stage 3a: Secondary | ICD-10-CM | POA: Diagnosis not present

## 2021-09-03 DIAGNOSIS — I5022 Chronic systolic (congestive) heart failure: Secondary | ICD-10-CM | POA: Diagnosis not present

## 2021-09-17 ENCOUNTER — Encounter: Payer: Self-pay | Admitting: Cardiovascular Disease

## 2021-09-17 ENCOUNTER — Ambulatory Visit (INDEPENDENT_AMBULATORY_CARE_PROVIDER_SITE_OTHER): Payer: Medicare Other | Admitting: Cardiovascular Disease

## 2021-09-17 VITALS — BP 130/82 | HR 63 | Ht 69.0 in | Wt 231.2 lb

## 2021-09-17 DIAGNOSIS — I5043 Acute on chronic combined systolic (congestive) and diastolic (congestive) heart failure: Secondary | ICD-10-CM | POA: Diagnosis not present

## 2021-09-17 DIAGNOSIS — E785 Hyperlipidemia, unspecified: Secondary | ICD-10-CM | POA: Diagnosis not present

## 2021-09-17 DIAGNOSIS — I2510 Atherosclerotic heart disease of native coronary artery without angina pectoris: Secondary | ICD-10-CM

## 2021-09-17 LAB — LIPID PANEL
Chol/HDL Ratio: 4.9 ratio (ref 0.0–5.0)
Cholesterol, Total: 181 mg/dL (ref 100–199)
HDL: 37 mg/dL — ABNORMAL LOW (ref >39)
LDL Chol Calc (NIH): 116 mg/dL — ABNORMAL HIGH (ref 0–99)
Triglycerides: 158 mg/dL — ABNORMAL HIGH (ref 0–149)
VLDL Cholesterol Cal: 28 mg/dL (ref 5–40)

## 2021-09-17 LAB — BASIC METABOLIC PANEL
BUN/Creatinine Ratio: 16 (ref 10–24)
BUN: 21 mg/dL (ref 8–27)
CO2: 26 mmol/L (ref 20–29)
Calcium: 9.5 mg/dL (ref 8.6–10.2)
Chloride: 103 mmol/L (ref 96–106)
Creatinine, Ser: 1.29 mg/dL — ABNORMAL HIGH (ref 0.76–1.27)
Glucose: 198 mg/dL — ABNORMAL HIGH (ref 70–99)
Potassium: 4.4 mmol/L (ref 3.5–5.2)
Sodium: 141 mmol/L (ref 134–144)
eGFR: 56 mL/min/{1.73_m2} — ABNORMAL LOW (ref >59)

## 2021-09-17 LAB — ALT: ALT: 21 IU/L (ref 0–44)

## 2021-09-17 NOTE — Patient Instructions (Signed)
Medication Instructions:  ?Your physician recommends that you continue on your current medications as directed. Please refer to the Current Medication list given to you today. ? ?*If you need a refill on your cardiac medications before your next appointment, please call your pharmacy* ? ?Lab Work: ?TODAY: Lipids, ALT, BMP ?If you have labs (blood work) drawn today and your tests are completely normal, you will receive your results only by: ?MyChart Message (if you have MyChart) OR ?A paper copy in the mail ?If you have any lab test that is abnormal or we need to change your treatment, we will call you to review the results. ? ?Testing/Procedures: ?NONE ? ?Follow-Up: ?At [facility], you and your health needs are our priority.  As part of our continuing mission to provide you with exceptional heart care, we have created designated Provider Care Teams.  These Care Teams include your primary Cardiologist (physician) and Advanced Practice Providers (APPs -  Physician Assistants and Nurse Practitioners) who all work together to provide you with the care you need, when you need it. ? ?We recommend signing up for the patient portal called "MyChart".  Sign up information is provided on this After Visit Summary.  MyChart is used to connect with patients for Virtual Visits (Telemedicine).  Patients are able to view lab/test results, encounter notes, upcoming appointments, etc.  Non-urgent messages can be sent to your provider as well.   ?To learn more about what you can do with MyChart, go to [email or web address].   ? ?Your next appointment:   ?1 year(s) ? ?The format for your next appointment:   ?In Person ? ?Provider:   ?[name], MD  or Robbie Lis, PA-C, Christen Bame, NP, or [name], PA-C      ? ?Other Instructions ? ?Important Information About Sugar ? ? ? ? ?  ?

## 2021-09-17 NOTE — Progress Notes (Signed)
?Cardiology Office Note:   ? ?Date:  09/21/2021  ? ?ID:  [name], DOB 1941-04-22, MRN [id number] ? ?PCP:  [name], MD  ?Cardiologist:  [name], MD   ? ?Referring MD: [name], MD  ? ?Chief Complaint  ?Patient presents with  ? Congestive Heart Failure  ?   ?  ? Coronary Artery Disease  ?   ?  ? Hyperlipidemia  ? ? ?   ? ?[name] is a 81 y.o. male with a hx of Coronary artery disease been years ago. He is a previous patient of Dr. Bing Quarry. He presents now with symptoms of exertional chest pain and shortness breath. ? ?He has shortness of breath at night or during the day  ?Walking 5-6 minutes causes significant dyspnea.  ?Also has claudication in his legs with walking  ? ?Has occasional chest pain  - usually when he is at rest. ?Has chest squeezing. - occurs randomly  ?Has lots of indigestion - occurs randomly  ? ?Also has lots of sweating - not necessarliy related to his chest tightness ? does not feel similar to his previous MI pain .   No CP since that time  ? ?Hx of MI in 1996 -  Had stent by [name] ?Stopped smoking at that time  ? ?Does not exercise regularly    .   Works out in the yard.  ? ?Sept. 5, 2018: ?[name] is seen today for follow up visit.   S/p recent stenting of his prox - mid LAD  ?Hx of CAD , stenting by [name] in the past .  ? ?PTs BP has been low ?Has been on a diet and has been cutting back on his protein and his food in general .  Has lost 8.5 lbs  ? ?Had an episode of CP at night.   Better after NTG . ?Still fairly active .   Walks a little ,  Just started a walking program .   Walks 1/2 mile a day without CP ? ?His medical doctor had recently started him on a BP med and he has stopped it now (does not remember the name of it)  ? ?Echo Aug. 28 shows normal LV function  ? ?August 18, 2017: ?[name] is seen today for follow-up of his coronary artery disease. ?He has a history of coronary artery disease and is status post stent placement to the left  anterior descending artery in 01/21/2017 ?Overall he is doing very well.  Is not had any episodes of angina since his PCI in 01/21/17.  He does have some shortness of breath with exertion. ? ?He is exercising fairly regularly.  Wife is had problems and he has had to help her.  ?He is lost some weight since his angioplasty.   ? ?His last lipid level shows a triglyceride level of 229.  His total cholesterol is 158.  HDL is 39.  LDL 73. ? ?Jan. 6, 2020 ? ?[name] is seen today for follow-up of his coronary artery disease.  He status post stent placement to the left anterior descending artery in 01/21/17. ? ?Wife passed away several months .  Pneumonia and sepsis. ?Had been married 75 years.    ?Has not had any angina  ?   ? ?June 19, 2018 ? ?[name] was seen last week for follow-up of his coronary artery disease.  Several days later he was seen in the [facility] emergency room with a GI bleed. ?Is  now on Protonox and Plavix is on hold until Jan. 27.   ?Stent placent was aug. 2018. ? ? ?ECG revealed 2 ulcers - 1 large and 1 small  ?Seems to be getting better  ?Still has DOE  ? ?Sept. 10, 2020 : ? ?[name] is seen today  ?Hx of CAD and GI bleeding  ?Has had tightness in his chest  ?Has chest tightness, pressure with exertion. ?Lasted 1 hour- took a SL NTG which helped  ?Occurs with rest and exertion  ?Radiates to left shoulder , burning shoulder pain  ?NTG helps  ?Has been going on for several months . ?Not associated with syncope or near syncope  ?Has taken 2 SL  NTG over the past several weeks.  ? ?Has severe leg pain  ?Has severe leg weakness with walking  ?Will get lower extremity arterial evaluation ( ABI or duplex scan) ? ?Does not get any regular exercise  ?Has been eating more take out than usual  ? ?May 09, 2019: ? [name] is seen today for a follow-up visit for his coronary artery disease  ?He has a history of claudication-like symptoms but lower extremity arterial duplex scan was normal. ?He has a  history of hypertension hyperlipidemia.  He has a past history of smoking. ? ?Has a hx of GI bleeding .   ?Seems to be improving. ?Has a history of coronary artery disease.  Stress Myoview study from September, 2020 reveals an old anterior wall myocardial infarction but no ischemia.  His ejection fraction was 51% at that time. ? ?Recent labs from May 06, 2019 reveals a total cholesterol of 123.  The HDL is 44.  LDL is 63.  The triglyceride level is 84.  Glucose is 102.  Sodium and potassium are normal.  Creatinine is 1.39.  Liver enzymes are  Normal. ? ?Feb. 18, 2022: ?[name] is seen for follow-up of his coronary artery disease.  He is also here for preoperative evaluation prior to lumber steroid injection . ?He is on plavix 75 mg a day .  He had stenting of his mid LAD in Aug. 2018 ?myoview from Sept. 2020 reveals old ant Mi.  No ischemia  ?Having some chest pain for the past several months  ?Seems to be getting worse , needs to have his NTG refilled . Seems to radiate to his left sholder  ?Lots of dyspnea  ?Fatigue  ?Avoiding salt .  ? ? ?Aug. 22, 2022 ?[name] is seen for follow up of his CAD ?Has occasional episodes of heat and sweats  ?Has occasional episodes of chest soreness with left arm pain  ?No regular exercise - but no episodes of CP with activity  ?Has had weight gain , no weight loss, ?No diarrhea or constipation  ?He was admitted to the hospital in February, 2022 with chest pain.  Heart catheterization revealed mild stenosis of the proximal LAD.  There was moderate disease in the circumflex artery and the right coronary artery. ?Imdur was added to his meds -  he increased Imdur to 45 mg a day - did not seem to help much  ? ?September 17, 2021: ?[name] is seen today for follow-up of his coronary artery disease, congestive heart failure.  ?Watching his diet fairly well.  ?Last echocardiogram was August 24, 2020.  He has normal left ventricular systolic function.  He has grade 1 diastolic dysfunction and  trivial mitral regurgitation. ? ?He was having some chest discomfort last year that lasted for only a few seconds. ?  Event monitor revealed no significant arrhythmias.  He had very brief episodes of 5-6 beat runs of nonsustained supraventricular tachycardia.  These are benign. ? ? ?Past Medical History:  ?Diagnosis Date  ? Adenomatous colon polyp   ? Arthritis   ? CAD (coronary artery disease)   ? a. MI in 1996 with stent placement b. cath 01/18/17 - S/p PCI of in-stent restenosis of pLAD with cutting ballon & DES; 20% ostial LAD; 40% pro Cx; 60% focal pRCA  ? Clostridium difficile infection   ? Depression   ? Diverticulosis   ? H. pylori infection   ? HTN (hypertension)   ? Hyperlipidemia   ? MI (myocardial infarction) ([place])   ? ? ?Past Surgical History:  ?Procedure Laterality Date  ? [place]  ? benign tumor in back of head, done [facility]  ? CARDIAC CATHETERIZATION    ? COLONOSCOPY    ? hx of polyps  ? [place]; 1997; 01/18/2017  ? CORONARY STENT INTERVENTION N/A 01/18/2017  ? Procedure: CORONARY STENT INTERVENTION;  Surgeon: Troy Sine, MD;  Location: [facility];  Service: Cardiovascular;  Laterality: N/A;  ? ESOPHAGOGASTRODUODENOSCOPY (EGD) WITH PROPOFOL N/A 06/14/2018  ? Procedure: ESOPHAGOGASTRODUODENOSCOPY (EGD) WITH PROPOFOL;  Surgeon: Gatha Mayer, MD;  Location: [facility];  Service: Endoscopy;  Laterality: N/A;  ? INTRAVASCULAR PRESSURE WIRE/FFR STUDY N/A 07/28/2020  ? Procedure: INTRAVASCULAR PRESSURE WIRE/FFR STUDY;  Surgeon: Leonie Man, MD;  Location: [facility];  Service: Cardiovascular;  Laterality: N/A;  ? JOINT REPLACEMENT    ? LEFT HEART CATH AND CORONARY ANGIOGRAPHY N/A 01/18/2017  ? Procedure: LEFT HEART CATH AND CORONARY ANGIOGRAPHY;  Surgeon: Troy Sine, MD;  Location: [facility];  Service: Cardiovascular;  Laterality: N/A;  ? LEFT HEART CATH AND CORONARY ANGIOGRAPHY N/A 07/28/2020  ? Procedure: LEFT  HEART CATH AND CORONARY ANGIOGRAPHY;  Surgeon: Leonie Man, MD;  Location: [facility];  Service: Cardiovascular;  Laterality: N/A;  ? stent x2 in the LAD  with intra-aortic balloon pump support  1996  ? TO

## 2021-09-20 ENCOUNTER — Telehealth: Payer: Self-pay

## 2021-09-20 DIAGNOSIS — I5043 Acute on chronic combined systolic (congestive) and diastolic (congestive) heart failure: Secondary | ICD-10-CM

## 2021-09-20 DIAGNOSIS — E785 Hyperlipidemia, unspecified: Secondary | ICD-10-CM

## 2021-09-20 MED ORDER — ROSUVASTATIN CALCIUM 20 MG PO TABS: 20.0000 mg | ORAL_TABLET | Freq: Every day | ORAL | 3 refills | Status: DC

## 2021-09-20 NOTE — Telephone Encounter (Signed)
-----   Message from [name], MD sent at 09/20/2021 10:37 AM EDT ----- ?His LDL is up from 63 to 116 ?Did he stop his atrovatatin ? ?If he is still taking the atorvastin , it is not strong enough and we should substitute rosuvastatin instead ?( DC atorvastatin,  start rosuvastatin  20 mg a day )  ?Check lipids , ALT, BMP in 3 months)  ? ?If he ran out of the atorvastatin , then lets have him restart and recheck labs listed above  ?

## 2021-09-20 NOTE — Telephone Encounter (Signed)
The patient has been notified of the result and verbalized understanding.  All questions (if any) were answered. ?Antonieta Iba, RN 09/20/2021 10:54 AM  ?Patient reports that he is taking atorvastatin as prescribed. Advised that Dr. Acie Fredrickson would like to switch him to crestor 20 mg daily. Rx has been sent in. Repeat labs have been scheduled.  ?

## 2021-10-26 DIAGNOSIS — M25511 Pain in right shoulder: Secondary | ICD-10-CM | POA: Diagnosis not present

## 2021-11-08 DIAGNOSIS — M25552 Pain in left hip: Secondary | ICD-10-CM | POA: Diagnosis not present

## 2021-11-12 ENCOUNTER — Telehealth: Payer: Self-pay | Admitting: *Deleted

## 2021-11-12 NOTE — Telephone Encounter (Signed)
   Pre-operative Risk Assessment    Patient Name: [name]  DOB: 1940/07/25 MRN: [id number]     Request for Surgical Clearance    Procedure: 2 DENTAL  IMPLANT PLACEMENTS (TOOTH # 23, 26 AREAS)  Date of Surgery:  Clearance TBD                                 Surgeon:  DR. Tamela Oddi, DDS Surgeon's Group or Practice Name:  [facility] Phone number:  [phone] Fax number:  [phone]   Type of Clearance Requested:   - Medical  - Pharmacy:  Hold Clopidogrel (Plavix) x 1-2 DAYS PRIOR; WOULD LIKE RECOMMENDATION AS TO WHEN TO RESUME   Type of Anesthesia:  Local    Additional requests/questions:    Jiles Prows   11/12/2021, 10:33 AM

## 2021-11-15 NOTE — Telephone Encounter (Signed)
   Primary Cardiologist: [name], MD  Chart reviewed as part of pre-operative protocol coverage. Simple dental extractions or implants are considered low risk procedures per guidelines and generally do not require any specific cardiac clearance. It is also generally accepted that for simple extractions, implants, and dental cleanings, there is no need to interrupt blood thinner therapy.   SBE prophylaxis is not required for the patient.  I will route this recommendation to the requesting party via Epic fax function and remove from pre-op pool.  Please call with questions.  Emmaline Life, NP-C    11/15/2021, 12:21 PM [facility] [address] Office [phone] Fax [phone]

## 2021-12-01 ENCOUNTER — Encounter: Payer: Self-pay | Admitting: Podiatry

## 2021-12-01 ENCOUNTER — Ambulatory Visit (INDEPENDENT_AMBULATORY_CARE_PROVIDER_SITE_OTHER): Payer: Medicare Other | Admitting: Podiatry

## 2021-12-01 DIAGNOSIS — D689 Coagulation defect, unspecified: Secondary | ICD-10-CM

## 2021-12-01 DIAGNOSIS — M79609 Pain in unspecified limb: Secondary | ICD-10-CM

## 2021-12-01 DIAGNOSIS — B351 Tinea unguium: Secondary | ICD-10-CM

## 2021-12-01 NOTE — Progress Notes (Signed)
This patient returns to my office for at risk foot care.  This patient requires this care by a professional since this patient will be at risk due to having coagulation defect.  Patient is taking plavix.  This patient is unable to cut nails himself since the patient cannot reach his nails.These nails are painful walking and wearing shoes.  This patient presents for at risk foot care today.  General Appearance  Alert, conversant and in no acute stress.  Vascular  Dorsalis pedis and posterior tibial  pulses are palpable  bilaterally.  Capillary return is within normal limits  bilaterally. Temperature is within normal limits  bilaterally.  Neurologic  Senn-Weinstein monofilament wire test within normal limits  bilaterally. Muscle power within normal limits bilaterally.  Nails Thick disfigured discolored nails with subungual debris  from hallux to fifth toes bilaterally. No evidence of bacterial infection or drainage bilaterally.  Orthopedic  No limitations of motion  feet .  No crepitus or effusions noted.  No bony pathology or digital deformities noted.  Skin  normotropic skin with no porokeratosis noted bilaterally.  No signs of infections or ulcers noted.     Onychomycosis  Pain in right toes  Pain in left toes  Consent was obtained for treatment procedures.   Mechanical debridement of nails 1-5  bilaterally performed with a nail nipper.  Filed with dremel without incident.    Return office visit    3 months                  Told patient to return for periodic foot care and evaluation due to potential at risk complications.   Cyndal Kasson DPM  

## 2021-12-15 ENCOUNTER — Ambulatory Visit: Payer: Medicare Other | Admitting: Orthopaedic Surgery

## 2021-12-20 ENCOUNTER — Other Ambulatory Visit: Payer: Medicare Other

## 2021-12-21 ENCOUNTER — Other Ambulatory Visit: Payer: Medicare Other

## 2021-12-21 DIAGNOSIS — E785 Hyperlipidemia, unspecified: Secondary | ICD-10-CM

## 2021-12-22 LAB — LIPID PANEL
Chol/HDL Ratio: 3.5 ratio (ref 0.0–5.0)
Cholesterol, Total: 195 mg/dL (ref 100–199)
HDL: 56 mg/dL (ref >39)
LDL Chol Calc (NIH): 118 mg/dL — ABNORMAL HIGH (ref 0–99)
Triglycerides: 119 mg/dL (ref 0–149)
VLDL Cholesterol Cal: 21 mg/dL (ref 5–40)

## 2021-12-22 LAB — ALT: ALT: 19 IU/L (ref 0–44)

## 2021-12-28 ENCOUNTER — Emergency Department (HOSPITAL_BASED_OUTPATIENT_CLINIC_OR_DEPARTMENT_OTHER)
Admission: EM | Admit: 2021-12-28 | Discharge: 2021-12-28 | Disposition: A | Discharge status: Home / Self Care | Payer: Medicare Other | Attending: Emergency Medicine | Admitting: Emergency Medicine

## 2021-12-28 ENCOUNTER — Other Ambulatory Visit: Payer: Self-pay

## 2021-12-28 ENCOUNTER — Encounter (HOSPITAL_BASED_OUTPATIENT_CLINIC_OR_DEPARTMENT_OTHER): Payer: Self-pay | Admitting: Obstetrics and Gynecology

## 2021-12-28 ENCOUNTER — Other Ambulatory Visit (HOSPITAL_BASED_OUTPATIENT_CLINIC_OR_DEPARTMENT_OTHER): Payer: Self-pay

## 2021-12-28 DIAGNOSIS — I2510 Atherosclerotic heart disease of native coronary artery without angina pectoris: Secondary | ICD-10-CM | POA: Diagnosis not present

## 2021-12-28 DIAGNOSIS — I110 Hypertensive heart disease with heart failure: Secondary | ICD-10-CM | POA: Diagnosis not present

## 2021-12-28 DIAGNOSIS — Y9389 Activity, other specified: Secondary | ICD-10-CM | POA: Insufficient documentation

## 2021-12-28 DIAGNOSIS — I509 Heart failure, unspecified: Secondary | ICD-10-CM | POA: Diagnosis not present

## 2021-12-28 DIAGNOSIS — Z7902 Long term (current) use of antithrombotics/antiplatelets: Secondary | ICD-10-CM | POA: Diagnosis not present

## 2021-12-28 DIAGNOSIS — X17XXXA Contact with hot engines, machinery and tools, initial encounter: Secondary | ICD-10-CM | POA: Insufficient documentation

## 2021-12-28 DIAGNOSIS — T23161A Burn of first degree of back of right hand, initial encounter: Secondary | ICD-10-CM | POA: Diagnosis not present

## 2021-12-28 DIAGNOSIS — T310 Burns involving less than 10% of body surface: Secondary | ICD-10-CM | POA: Diagnosis not present

## 2021-12-28 DIAGNOSIS — T23261A Burn of second degree of back of right hand, initial encounter: Secondary | ICD-10-CM | POA: Diagnosis not present

## 2021-12-28 DIAGNOSIS — T23061A Burn of unspecified degree of back of right hand, initial encounter: Secondary | ICD-10-CM | POA: Diagnosis present

## 2021-12-28 MED ORDER — BACITRACIN 500 UNIT/GM EX OINT
1.0000 | TOPICAL_OINTMENT | Freq: Two times a day (BID) | CUTANEOUS | 0 refills | Status: DC
  Filled 2021-12-28: qty 28.4, 7d supply, fill #0
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
## 2021-12-30 ENCOUNTER — Telehealth: Payer: Self-pay

## 2021-12-30 DIAGNOSIS — E785 Hyperlipidemia, unspecified: Secondary | ICD-10-CM

## 2021-12-30 NOTE — Telephone Encounter (Signed)
Patient states he is still taking both Crestor and Zetia. I explained that based on the labs, it's not working. Patient agrees and understands that PharmD will be calling him to discuss and help manage cholesterol.

## 2021-12-30 NOTE — Telephone Encounter (Signed)
-----   Message from [name], MD sent at 12/22/2021  3:42 PM EDT ----- LDL is 118. He has known CAD and his goal is LDL  of 50-7- Is he still on the rosuvastatn and zetia. If so, it does not appear to be working and we should refer him to the lipid clinic for consideration of PCSK9 inhibitors.  If he has stopped water or both of these medications he should restart and we should remeasure lipid panel in 3 to 6 months.

## 2021-12-31 ENCOUNTER — Other Ambulatory Visit: Payer: Self-pay | Admitting: Cardiovascular Disease

## 2022-01-06 DIAGNOSIS — I5022 Chronic systolic (congestive) heart failure: Secondary | ICD-10-CM | POA: Diagnosis not present

## 2022-01-06 DIAGNOSIS — E1122 Type 2 diabetes mellitus with diabetic chronic kidney disease: Secondary | ICD-10-CM | POA: Diagnosis not present

## 2022-01-06 DIAGNOSIS — I130 Hypertensive heart and chronic kidney disease with heart failure and stage 1 through stage 4 chronic kidney disease, or unspecified chronic kidney disease: Secondary | ICD-10-CM | POA: Diagnosis not present

## 2022-01-06 DIAGNOSIS — I209 Angina pectoris, unspecified: Secondary | ICD-10-CM | POA: Diagnosis not present

## 2022-01-06 DIAGNOSIS — I25118 Atherosclerotic heart disease of native coronary artery with other forms of angina pectoris: Secondary | ICD-10-CM | POA: Diagnosis not present

## 2022-01-13 DIAGNOSIS — I209 Angina pectoris, unspecified: Secondary | ICD-10-CM | POA: Diagnosis not present

## 2022-01-13 DIAGNOSIS — E1122 Type 2 diabetes mellitus with diabetic chronic kidney disease: Secondary | ICD-10-CM | POA: Diagnosis not present

## 2022-01-13 DIAGNOSIS — I25118 Atherosclerotic heart disease of native coronary artery with other forms of angina pectoris: Secondary | ICD-10-CM | POA: Diagnosis not present

## 2022-01-13 DIAGNOSIS — I130 Hypertensive heart and chronic kidney disease with heart failure and stage 1 through stage 4 chronic kidney disease, or unspecified chronic kidney disease: Secondary | ICD-10-CM | POA: Diagnosis not present

## 2022-01-13 DIAGNOSIS — I5022 Chronic systolic (congestive) heart failure: Secondary | ICD-10-CM | POA: Diagnosis not present

## 2022-01-20 DIAGNOSIS — R072 Precordial pain: Secondary | ICD-10-CM | POA: Diagnosis not present

## 2022-01-20 DIAGNOSIS — E1122 Type 2 diabetes mellitus with diabetic chronic kidney disease: Secondary | ICD-10-CM | POA: Diagnosis not present

## 2022-01-20 DIAGNOSIS — I5033 Acute on chronic diastolic (congestive) heart failure: Secondary | ICD-10-CM | POA: Diagnosis not present

## 2022-01-20 DIAGNOSIS — I130 Hypertensive heart and chronic kidney disease with heart failure and stage 1 through stage 4 chronic kidney disease, or unspecified chronic kidney disease: Secondary | ICD-10-CM | POA: Diagnosis not present

## 2022-01-20 DIAGNOSIS — R0602 Shortness of breath: Secondary | ICD-10-CM | POA: Diagnosis not present

## 2022-01-24 DIAGNOSIS — R072 Precordial pain: Secondary | ICD-10-CM | POA: Diagnosis not present

## 2022-01-24 DIAGNOSIS — R29818 Other symptoms and signs involving the nervous system: Secondary | ICD-10-CM | POA: Diagnosis not present

## 2022-01-24 DIAGNOSIS — I5032 Chronic diastolic (congestive) heart failure: Secondary | ICD-10-CM | POA: Diagnosis not present

## 2022-01-24 DIAGNOSIS — R0602 Shortness of breath: Secondary | ICD-10-CM | POA: Diagnosis not present

## 2022-01-24 DIAGNOSIS — I130 Hypertensive heart and chronic kidney disease with heart failure and stage 1 through stage 4 chronic kidney disease, or unspecified chronic kidney disease: Secondary | ICD-10-CM | POA: Diagnosis not present

## 2022-01-26 ENCOUNTER — Other Ambulatory Visit: Payer: Self-pay | Admitting: Cardiovascular Disease

## 2022-01-27 DIAGNOSIS — I5032 Chronic diastolic (congestive) heart failure: Secondary | ICD-10-CM | POA: Diagnosis not present

## 2022-02-01 DIAGNOSIS — I5032 Chronic diastolic (congestive) heart failure: Secondary | ICD-10-CM | POA: Diagnosis not present

## 2022-02-01 DIAGNOSIS — I130 Hypertensive heart and chronic kidney disease with heart failure and stage 1 through stage 4 chronic kidney disease, or unspecified chronic kidney disease: Secondary | ICD-10-CM | POA: Diagnosis not present

## 2022-02-03 DIAGNOSIS — I209 Angina pectoris, unspecified: Secondary | ICD-10-CM | POA: Diagnosis not present

## 2022-02-03 DIAGNOSIS — R0602 Shortness of breath: Secondary | ICD-10-CM | POA: Diagnosis not present

## 2022-02-03 DIAGNOSIS — I130 Hypertensive heart and chronic kidney disease with heart failure and stage 1 through stage 4 chronic kidney disease, or unspecified chronic kidney disease: Secondary | ICD-10-CM | POA: Diagnosis not present

## 2022-02-03 DIAGNOSIS — I5022 Chronic systolic (congestive) heart failure: Secondary | ICD-10-CM | POA: Diagnosis not present

## 2022-02-03 DIAGNOSIS — E7849 Other hyperlipidemia: Secondary | ICD-10-CM | POA: Diagnosis not present

## 2022-02-03 DIAGNOSIS — I25118 Atherosclerotic heart disease of native coronary artery with other forms of angina pectoris: Secondary | ICD-10-CM | POA: Diagnosis not present

## 2022-02-03 DIAGNOSIS — E1122 Type 2 diabetes mellitus with diabetic chronic kidney disease: Secondary | ICD-10-CM | POA: Diagnosis not present

## 2022-02-08 ENCOUNTER — Other Ambulatory Visit: Payer: Self-pay | Admitting: Cardiovascular Disease

## 2022-02-17 ENCOUNTER — Telehealth: Payer: Self-pay | Admitting: Cardiovascular Disease

## 2022-02-17 ENCOUNTER — Encounter: Payer: Self-pay | Admitting: Cardiovascular Disease

## 2022-02-17 ENCOUNTER — Ambulatory Visit: Payer: Medicare Other | Attending: Cardiovascular Disease | Admitting: Cardiovascular Disease

## 2022-02-17 VITALS — BP 122/76 | HR 75 | Ht 69.0 in | Wt 229.4 lb

## 2022-02-17 DIAGNOSIS — I2510 Atherosclerotic heart disease of native coronary artery without angina pectoris: Secondary | ICD-10-CM

## 2022-02-17 DIAGNOSIS — I5031 Acute diastolic (congestive) heart failure: Secondary | ICD-10-CM | POA: Diagnosis not present

## 2022-02-17 DIAGNOSIS — Z79899 Other long term (current) drug therapy: Secondary | ICD-10-CM | POA: Insufficient documentation

## 2022-02-17 MED ORDER — TORSEMIDE 20 MG PO TABS: 20.0000 mg | ORAL_TABLET | Freq: Two times a day (BID) | ORAL | 3 refills | Status: DC

## 2022-02-17 NOTE — Telephone Encounter (Signed)
Patient call sent straight to triage. Patient stated he has been feeling SOB for a while, but recently had some tightness in his chest too. Patient stated the SOB can come on with rest or activity. Patient stated he is not having any chest tightness now. Encouraged patient the next time he has chest tightness to take a nitro. Informed patient on how to take nitro. Informed patient that if his symptoms come back and are not relieved with nitro to call 911. Patient has an appointment today with Dr. Acie Fredrickson. Patient will keep his appointment for now and go to ED if symptoms get worse and not relieved with nitro.

## 2022-02-17 NOTE — Telephone Encounter (Signed)
Pt c/o Shortness Of Breath: STAT if SOB developed within the last 24 hours or pt is noticeably SOB on the phone  1. Are you currently SOB (can you hear that pt is SOB on the phone)? Yes  2. How long have you been experiencing SOB? For awhile now. He says last time he saw Dr. Acie Fredrickson he was given meds for it, but it seems to be getting worse.   3. Are you SOB when sitting or when up moving around? Both  4. Are you currently experiencing any other symptoms? Pt states that he will break out in sweat and has to sometimes get up and go out side to catch his breath when he is somewhere like church.

## 2022-02-17 NOTE — Progress Notes (Unsigned)
Cardiology Office Note:    Date:  02/18/2022   ID:  [name], DOB 1940-06-21, MRN [id number]  PCP:  [name], [place] Providers Cardiologist:  [name], MD {    Referring MD: [name], MD   Chief Complaint  Patient presents with   Shortness of Breath     History of Present Illness:    [name] is a 81 y.o. male with a hx of coronary artery disease, status post stent placement, congestive heart failure  He presents today for further evaluation and management of increasing shortness of breath.  Echo ( [place] - images not available ) report indicates he has normal LV systolic function,  grade I diastolic dysfunction.    Sept. 14, 2023  Here with worsening dyspnea  Chest pressure  With waking , feels presyncopal  Feels unsteady on his feet.   Had a stent placed several years ago Symptoms might be similar ,  Is now on Repatha   The progressive DOE / chest pressure has been present for several months   Eats sausage and bologna several times a week .   Medical doctor started Lasix 40 mg a day  Does not urinate much with that. Wt is 229  Sept. 15, 2023 [name] is seen for follow up of his dyspnea and chest tightness He was seen yesterday with apparent volume overload + rales bilaterally  Eats a very salty diet every day - Ive asked him to cut out his hot dogs and bologna   We started him on torsemide  ( instead of furosemide ) when he went to his pharmacy they stated that they did not have the torsemide ordered.  We have [facility] that [facility]  Since he was not able to take the torsemide he doubled up on his Lasix.  He put out quite a bit more urine.  His weight today is 226 pounds which is down 3 pounds from yesterday.  He states that he is not really breathing all that much better.      Past Medical History:  Diagnosis Date   Adenomatous colon polyp     Arthritis    CAD (coronary artery disease)    a. MI in 1996 with stent placement b. cath 01/18/17 - S/p PCI of in-stent restenosis of pLAD with cutting ballon & DES; 20% ostial LAD; 40% pro Cx; 60% focal pRCA   Clostridium difficile infection    Depression    Diverticulosis    H. pylori infection    HTN (hypertension)    Hyperlipidemia    MI (myocardial infarction) [facility]     Past Surgical History:  Procedure Laterality Date   [place]   benign tumor in back of head, done at  [facility]     hx of [place]; 1997; 01/18/2017   CORONARY STENT INTERVENTION N/A 01/18/2017   Procedure: CORONARY STENT INTERVENTION;  Surgeon: Troy Sine, MD;  Location: [facility];  Service: Cardiovascular;  Laterality: N/A;   ESOPHAGOGASTRODUODENOSCOPY (EGD) WITH PROPOFOL N/A 06/14/2018   Procedure: ESOPHAGOGASTRODUODENOSCOPY (EGD) WITH PROPOFOL;  Surgeon: Gatha Mayer, MD;  Location: [facility];  Service: Endoscopy;  Laterality: N/A;   INTRAVASCULAR PRESSURE WIRE/FFR STUDY N/A 07/28/2020   Procedure: INTRAVASCULAR PRESSURE WIRE/FFR STUDY;  Surgeon: Leonie Man, MD;  Location: [facility];  Service: Cardiovascular;  Laterality: N/A;   JOINT REPLACEMENT     LEFT HEART CATH AND CORONARY ANGIOGRAPHY N/A 01/18/2017   Procedure: LEFT HEART CATH AND CORONARY ANGIOGRAPHY;  Surgeon: Troy Sine, MD;  Location: [facility];  Service: Cardiovascular;  Laterality: N/A;   LEFT HEART CATH AND CORONARY ANGIOGRAPHY N/A 07/28/2020   Procedure: LEFT HEART CATH AND CORONARY ANGIOGRAPHY;  Surgeon: Leonie Man, MD;  Location: [facility];  Service: Cardiovascular;  Laterality: N/A;   stent x2 in the LAD  with intra-aortic balloon pump support  1996   TONSILLECTOMY     TOTAL KNEE ARTHROPLASTY Bilateral     Current Medications: Current Meds  Medication Sig   clopidogrel (PLAVIX)  75 MG tablet TAKE ONE TABLET BY MOUTH ONCE DAILY   escitalopram (LEXAPRO) 20 MG tablet Take 20 mg by mouth daily.   fenofibrate (TRICOR) 145 MG tablet TAKE ONE TABLET BY MOUTH ONCE DAILY   isosorbide mononitrate (IMDUR) 30 MG 24 hr tablet TAKE 1 AND 1/2 TABLETS BY MOUTH ONCE DAILY   meloxicam (MOBIC) 7.5 MG tablet Take 7.5 mg by mouth daily.   pantoprazole (PROTONIX) 40 MG tablet Take 40 mg by mouth daily.   potassium chloride SA (KLOR-CON M) 20 MEQ tablet Take 20 mEq by mouth daily.   REPATHA SURECLICK 656 MG/ML SOAJ Inject 1 mL into the skin every 14 (fourteen) days.   rosuvastatin (CRESTOR) 20 MG tablet Take 1 tablet (20 mg total) by mouth daily.   [DISCONTINUED] torsemide (DEMADEX) 20 MG tablet Take 1 tablet (20 mg total) by mouth 2 (two) times daily.   Current Facility-Administered Medications for the 02/18/22 encounter (Office Visit) with [name], Wonda Cheng, MD  Medication   nitroGLYCERIN (NITROSTAT) SL tablet 0.4 mg     Allergies:   Gabapentin   Social History   Socioeconomic History   Marital status: Widowed    Spouse name: Not on file   Number of children: 1   Years of education: Not on file   Highest education level: Not on file  Occupational History   Occupation: Retired  Tobacco Use   Smoking status: Former    Types: Cigarettes    Quit date: 06/06/1994    Years since quitting: 27.7   Smokeless tobacco: Never  Vaping Use   Vaping Use: Never used  Substance and Sexual Activity   Alcohol use: No   Drug use: No   Sexual activity: Yes  Other Topics Concern   Not on file  Social History Narrative   Not on file   Social Determinants of Health   Financial Resource Strain: Not on file  Food Insecurity: Not on file  Transportation Needs: Not on file  Physical Activity: Not on file  Stress: Not on file  Social Connections: Not on file     Family History: The patient's family history includes Cancer in his sister; Heart attack in his mother; Heart attack (age of  onset: 65) in his brother. There is no history of Colon cancer, Stomach cancer, Rectal cancer, or Esophageal cancer.  ROS:   Please see the history of present illness.     All other systems reviewed and are negative.  EKGs/Labs/Other Studies Reviewed:    The following studies were reviewed today:   EKG:      Recent Labs: 12/21/2021: ALT 19 02/17/2022: BUN 23; Creatinine, Ser 1.46; Potassium 4.3; Sodium 141  Recent Lipid Panel    Component Value  Date/Time   CHOL 195 12/21/2021 1059   TRIG 119 12/21/2021 1059   HDL 56 12/21/2021 1059   CHOLHDL 3.5 12/21/2021 1059   CHOLHDL 4 06/23/2009 1147   VLDL 23.4 06/23/2009 1147   LDLCALC 118 (H) 12/21/2021 1059   LDLDIRECT 159.1 12/10/2007 0851     Risk Assessment/Calculations:               Physical Exam:    Physical Exam: Blood pressure (!) 140/86, pulse 69, height '5\' 9"'$  (1.753 m), weight 226 lb (102.5 kg), SpO2 96 %.   Repeat BP was unchanged I was not able to use the dot phrase to repeat BP and select the appropriate management    GEN:  Well nourished, well developed in no acute distress HEENT: Normal NECK: No JVD; No carotid bruits LYMPHATICS: No lymphadenopathy CARDIAC: RRR , no murmurs, rubs, gallops RESPIRATORY:  Clear to auscultation without rales, wheezing or rhonchi  ABDOMEN: Soft, non-tender, non-distended MUSCULOSKELETAL:  No edema; No deformity  SKIN: Warm and dry NEUROLOGIC:  Alert and oriented x 3   ASSESSMENT:    1. Chest tightness     PLAN:    In order of problems listed above:  Acute on chronic diastolic congestive heart failure: .  We changed his furosemide to Torsemide yesterday but the pharmacy was not able to provide him with the torsemide.  He doubled his Lasix instead.Marland Kitchen  He is lost 3 pounds.  He still short of breath.  I would like to start   torsemide 20 mg twice a day until next Tuesday.  I will reevaluate him on Tuesday.  We will likely check repeat basic metabolic profile at that  time.  If we are able to successfully diurese him and if he is still short of breath then I think that we will need to proceed with heart catheterization              Medication Adjustments/Labs and Tests Ordered: Current medicines are reviewed at length with the patient today.  Concerns regarding medicines are outlined above.  Orders Placed This Encounter  Procedures   Basic Metabolic Panel (BMET)   Meds ordered this encounter  Medications   nitroGLYCERIN (NITROSTAT) SL tablet 0.4 mg   DISCONTD: torsemide (DEMADEX) 20 MG tablet    Sig: Take 1 tablet (20 mg total) by mouth 2 (two) times daily.    Dispense:  60 tablet    Refill:  0   nitroGLYCERIN (NITROSTAT) 0.4 MG SL tablet    Sig: Place 1 tablet (0.4 mg total) under the tongue every 5 (five) minutes as needed for chest pain.    Dispense:  25 tablet    Refill:  3   torsemide (DEMADEX) 20 MG tablet    Sig: Take 1 tablet (20 mg total) by mouth 2 (two) times daily.    Dispense:  60 tablet    Refill:  0     Patient Instructions  Medication Instructions:  Your physician has recommended you make the following change in your medication:  1-START Torsemide 20 mg by mouth twice daily. 2-Take 1 Nitroglycerin, under your tongue, while sitting.  If no relief of pain may repeat Nitroglycerin, one tab every 5 minutes up to 3 tablets total over 15 minutes.  If no relief CALL 911.  If you have dizziness/lightheadness  while taking Nitroglycerin, stop taking and call 911.    *If you need a refill on your cardiac medications before your next appointment, please call your pharmacy*  Lab Work: Your physician recommends that you return for lab work in: 1 week BMET  If you have labs (blood work) drawn today and your tests are completely normal, you will receive your results only by: [place] (if you have [place]) OR A paper copy in the mail If you have any lab test that is abnormal or we need to change your treatment, we will call  you to review the results.  Follow-Up: At [facility], you and your health needs are our priority.  As part of our continuing mission to provide you with exceptional heart care, we have created designated Provider Care Teams.  These Care Teams include your primary Cardiologist (physician) and Advanced Practice Providers (APPs -  Physician Assistants and Nurse Practitioners) who all work together to provide you with the care you need, when you need it.  We recommend signing up for the patient portal called "MyChart".  Sign up information is provided on this After Visit Summary.  MyChart is used to connect with patients for Virtual Visits (Telemedicine).  Patients are able to view lab/test results, encounter notes, upcoming appointments, etc.  Non-urgent messages can be sent to your provider as well.   To learn more about what you can do with MyChart, go to [email or web address].    Your next appointment:   1 week(s)  The format for your next appointment:   In Person  Provider:   [name], MD      Important Information About Sugar         Signed, [name], MD  02/18/2022 6:20 PM    [place]

## 2022-02-17 NOTE — Progress Notes (Signed)
Cardiology Office Note:    Date:  02/17/2022   ID:  [name], DOB Jan 28, 1941, MRN [id number]  PCP:  [name], [place] Prov[facility] Cardiologist:  [name], MD {    Referring MD: [name], MD   Chief Complaint  Patient presents with   Congestive Heart Failure        Coronary Artery Disease        Hyperlipidemia     History of Present Illness:    [name] is a 81 y.o. male with a hx of coronary artery disease, status post stent placement, congestive heart failure  He presents today for further evaluation and management of increasing shortness of breath.  Echo ( [place] - images not available ) report indicates he has normal LV systolic function,  grade I diastolic dysfunction.    Sept. 14, 2023  Here with worsening dyspnea  Chest pressure  With waking , feels presyncopal  Feels unsteady on his feet.   Had a stent placed several years ago Symptoms might be similar ,  Is now on Repatha   The progressive DOE / chest pressure has been present for several months   Eats sausage and bologna several times a week .   Medical doctor started Lasix 40 mg a day  Does not urinate much with that.      Past Medical History:  Diagnosis Date   Adenomatous colon polyp    Arthritis    CAD (coronary artery disease)    a. MI in 1996 with stent placement b. cath 01/18/17 - S/p PCI of in-stent restenosis of pLAD with cutting ballon & DES; 20% ostial LAD; 40% pro Cx; 60% focal pRCA   Clostridium difficile infection    Depression    Diverticulosis    H. pylori infection    HTN (hypertension)    Hyperlipidemia    MI (myocardial infarction) [facility])     Past Surgical History:  Procedure Laterality Date   [place]   benign tumor in back of head, done at  [place]     hx of [place]; 1997; 01/18/2017    CORONARY STENT INTERVENTION N/A 01/18/2017   Procedure: CORONARY STENT INTERVENTION;  Surgeon: Troy Sine, MD;  Location: [place] [facility];  Service: Cardiovascular;  Laterality: N/A;   ESOPHAGOGASTRODUODENOSCOPY (EGD) WITH PROPOFOL N/A 06/14/2018   Procedure: ESOPHAGOGASTRODUODENOSCOPY (EGD) WITH PROPOFOL;  Surgeon: Gatha Mayer, MD;  Location: [facility];  Service: Endoscopy;  Laterality: N/A;   INTRAVASCULAR PRESSURE WIRE/FFR STUDY N/A 07/28/2020   Procedure: INTRAVASCULAR PRESSURE WIRE/FFR STUDY;  Surgeon: Leonie Man, MD;  Location: [place] [facility];  Service: Cardiovascular;  Laterality: N/A;   JOINT REPLACEMENT     LEFT HEART CATH AND CORONARY ANGIOGRAPHY N/A 01/18/2017   Procedure: LEFT HEART CATH AND CORONARY ANGIOGRAPHY;  Surgeon: Troy Sine, MD;  Location: [place] [facility];  Service: Cardiovascular;  Laterality: N/A;   LEFT HEART CATH AND CORONARY ANGIOGRAPHY N/A 07/28/2020   Procedure: LEFT HEART CATH AND CORONARY ANGIOGRAPHY;  Surgeon: Leonie Man, MD;  Location: [place] [facility];  Service: Cardiovascular;  Laterality: N/A;   stent x2 in the LAD  with intra-aortic balloon pump support  1996   TONSILLECTOMY     TOTAL KNEE ARTHROPLASTY Bilateral     Current Medications: Current Meds  Medication Sig   clopidogrel (PLAVIX) 75 MG tablet TAKE ONE TABLET BY MOUTH ONCE DAILY   escitalopram (LEXAPRO) 20 MG tablet Take 20 mg by mouth daily.   fenofibrate (TRICOR) 145 MG tablet TAKE ONE TABLET BY MOUTH ONCE DAILY   isosorbide mononitrate (IMDUR) 30 MG 24 hr tablet TAKE 1 AND 1/2 TABLETS BY MOUTH ONCE DAILY   meloxicam (MOBIC) 7.5 MG tablet Take 7.5 mg by mouth daily.   pantoprazole (PROTONIX) 40 MG tablet Take 40 mg by mouth daily.   potassium chloride SA (KLOR-CON M) 20 MEQ tablet Take 20 mEq by mouth daily.   REPATHA SURECLICK 229 MG/ML SOAJ Inject 1 mL into the skin every 14 (fourteen) days.   rosuvastatin (CRESTOR) 20 MG tablet Take 1 tablet (20 mg total)  by mouth daily.   torsemide (DEMADEX) 20 MG tablet Take 1 tablet (20 mg total) by mouth 2 (two) times daily.   [DISCONTINUED] furosemide (LASIX) 20 MG tablet Take 20 mg by mouth daily.     Allergies:   Gabapentin   Social History   Socioeconomic History   Marital status: Widowed    Spouse name: Not on file   Number of children: 1   Years of education: Not on file   Highest education level: Not on file  Occupational History   Occupation: Retired  Tobacco Use   Smoking status: Former    Types: Cigarettes    Quit date: 06/06/1994    Years since quitting: 27.7   Smokeless tobacco: Never  Vaping Use   Vaping Use: Never used  Substance and Sexual Activity   Alcohol use: No   Drug use: No   Sexual activity: Yes  Other Topics Concern   Not on file  Social History Narrative   Not on file   Social Determinants of Health   Financial Resource Strain: Not on file  Food Insecurity: Not on file  Transportation Needs: Not on file  Physical Activity: Not on file  Stress: Not on file  Social Connections: Not on file     Family History: The patient's family history includes Cancer in his sister; Heart attack in his mother; Heart attack (age of onset: 35) in his brother. There is no history of Colon cancer, Stomach cancer, Rectal cancer, or Esophageal cancer.  ROS:   Please see the history of present illness.     All other systems reviewed and are negative.  EKGs/Labs/Other Studies Reviewed:    The following studies were reviewed today:   EKG:  Sept. 14, 2023: Normal sinus rhythm at 69.  Right bundle branch block.  Left anterior fascicular block.  Possible old septal myocardial infarction.  No significant changes from previous EKG.   Recent Labs: 09/17/2021: BUN 21; Creatinine, Ser 1.29; Potassium 4.4; Sodium 141 12/21/2021: ALT 19  Recent Lipid Panel    Component Value Date/Time   CHOL 195 12/21/2021 1059   TRIG 119 12/21/2021 1059   HDL 56 12/21/2021 1059   CHOLHDL 3.5  12/21/2021 1059   CHOLHDL 4 06/23/2009 1147   VLDL 23.4 06/23/2009 1147   LDLCALC 118 (H) 12/21/2021 1059   LDLDIRECT 159.1 12/10/2007 0851     Risk Assessment/Calculations:                Physical Exam:    VS:  BP 122/76   Pulse 75   Ht '5\' 9"'$  (1.753 m)   Wt 229 lb 6.4 oz (104.1 kg)   SpO2 92%   BMI 33.88 kg/m  Wt Readings from Last 3 Encounters:  02/17/22 229 lb 6.4 oz (104.1 kg)  12/28/21 220 lb (99.8 kg)  09/17/21 231 lb 3.2 oz (104.9 kg)     GEN:  elderly male, NAD ,   HEENT: Normal NECK: No JVD; No carotid bruits LYMPHATICS: No lymphadenopathy CARDIAC: RRR, no murmurs, rubs, gallops RESPIRATORY:  basilar rales  ABDOMEN: Soft, non-tender, non-distended MUSCULOSKELETAL:  No edema; No deformity  SKIN: Warm and dry NEUROLOGIC:  Alert and oriented x 3 PSYCHIATRIC:  Normal affect   ASSESSMENT:    1. Medication management   2. Acute diastolic heart failure (HCC)    PLAN:    In order of problems listed above:  Acute on chronic diastolic congestive heart failure: Kylor presents with bilateral rales.  I suspect he has acute congestive heart failure.  He eats bologna and hotdogs on a regular basis.  He is having a mediocre response to Lasix.  We will start him on torsemide 20 mg twice a day.  I will have him return tomorrow.  Hopefully will see some weight loss and clearing of his lungs.  If his symptoms do not clear up with diuresis then we will need to consider repeat heart catheterization.  Basic metabolic profile today.             Medication Adjustments/Labs and Tests Ordered: Current medicines are reviewed at length with the patient today.  Concerns regarding medicines are outlined above.  Orders Placed This Encounter  Procedures   Basic metabolic panel   EKG 65-LDJT   Meds ordered this encounter  Medications   torsemide (DEMADEX) 20 MG tablet    Sig: Take 1 tablet (20 mg total) by mouth 2 (two) times daily.    Dispense:  180 tablet     Refill:  3    Replaces furosemide     Patient Instructions  Medication Instructions:  INCREASE Torsemide to '20mg'$  Twice daily STOP Lasix *If you need a refill on your cardiac medications before your next appointment, please call your pharmacy*   Lab Work: BMET today If you have labs (blood work) drawn today and your tests are completely normal, you will receive your results only by: [place] (if you have MyChart) OR A paper copy in the mail If you have any lab test that is abnormal or we need to change your treatment, we will call you to review the results.   Testing/Procedures: NONE   Follow-Up: At [facility], you and your health needs are our priority.  As part of our continuing mission to provide you with exceptional heart care, we have created designated Provider Care Teams.  These Care Teams include your primary Cardiologist (physician) and Advanced Practice Providers (APPs -  Physician Assistants and Nurse Practitioners) who all work together to provide you with the care you need, when you need it.  We recommend signing up for the patient portal called "MyChart".  Sign up information is provided on this After Visit Summary.  MyChart is used to connect with patients for Virtual Visits (Telemedicine).  Patients are able to view lab/test results, encounter notes, upcoming appointments, etc.  Non-urgent messages can be sent to your provider as well.   To learn more about what you can do with MyChart, go to [email or web address].    Your next appointment:   Friday, 9/15 @ 03:20 (tomorrow)  The format for your next appointment:   In Person  Provider:   [name], MD  Important Information About Sugar         Signed, [name], MD  02/17/2022 8:15 PM    [place]

## 2022-02-17 NOTE — Patient Instructions (Signed)
Medication Instructions:  INCREASE Torsemide to '20mg'$  Twice daily STOP Lasix *If you need a refill on your cardiac medications before your next appointment, please call your pharmacy*   Lab Work: BMET today If you have labs (blood work) drawn today and your tests are completely normal, you will receive your results only by: [place] (if you have MyChart) OR A paper copy in the mail If you have any lab test that is abnormal or we need to change your treatment, we will call you to review the results.   Testing/Procedures: NONE   Follow-Up: At [facility], you and your health needs are our priority.  As part of our continuing mission to provide you with exceptional heart care, we have created designated Provider Care Teams.  These Care Teams include your primary Cardiologist (physician) and Advanced Practice Providers (APPs -  Physician Assistants and Nurse Practitioners) who all work together to provide you with the care you need, when you need it.  We recommend signing up for the patient portal called "MyChart".  Sign up information is provided on this After Visit Summary.  MyChart is used to connect with patients for Virtual Visits (Telemedicine).  Patients are able to view lab/test results, encounter notes, upcoming appointments, etc.  Non-urgent messages can be sent to your provider as well.   To learn more about what you can do with MyChart, go to [email or web address].    Your next appointment:   Friday, 9/15 @ 03:20 (tomorrow)  The format for your next appointment:   In Person  Provider:   [name], MD       Important Information About Sugar

## 2022-02-18 ENCOUNTER — Ambulatory Visit: Payer: Medicare Other | Attending: Cardiovascular Disease | Admitting: Cardiovascular Disease

## 2022-02-18 ENCOUNTER — Encounter: Payer: Self-pay | Admitting: Cardiovascular Disease

## 2022-02-18 VITALS — BP 140/86 | HR 69 | Ht 69.0 in | Wt 226.0 lb

## 2022-02-18 DIAGNOSIS — R0789 Other chest pain: Secondary | ICD-10-CM

## 2022-02-18 DIAGNOSIS — I2510 Atherosclerotic heart disease of native coronary artery without angina pectoris: Secondary | ICD-10-CM | POA: Diagnosis not present

## 2022-02-18 LAB — BASIC METABOLIC PANEL
BUN/Creatinine Ratio: 16 (ref 10–24)
BUN: 23 mg/dL (ref 8–27)
CO2: 23 mmol/L (ref 20–29)
Calcium: 9.6 mg/dL (ref 8.6–10.2)
Chloride: 103 mmol/L (ref 96–106)
Creatinine, Ser: 1.46 mg/dL — ABNORMAL HIGH (ref 0.76–1.27)
Glucose: 177 mg/dL — ABNORMAL HIGH (ref 70–99)
Potassium: 4.3 mmol/L (ref 3.5–5.2)
Sodium: 141 mmol/L (ref 134–144)
eGFR: 49 mL/min/{1.73_m2} — ABNORMAL LOW (ref >59)

## 2022-02-18 MED ORDER — NITROGLYCERIN 0.4 MG SL SUBL
0.4000 mg | SUBLINGUAL_TABLET | SUBLINGUAL | Status: DC | PRN
  Administered 2022-02-18: 0.4 mg via SUBLINGUAL

## 2022-02-18 MED ORDER — TORSEMIDE 20 MG PO TABS: 20.0000 mg | ORAL_TABLET | Freq: Two times a day (BID) | ORAL | 0 refills | Status: DC

## 2022-02-18 MED ORDER — NITROGLYCERIN 0.4 MG SL SUBL: 0.4000 mg | SUBLINGUAL_TABLET | SUBLINGUAL | 3 refills | Status: DC | PRN

## 2022-02-18 NOTE — Patient Instructions (Signed)
Medication Instructions:  Your physician has recommended you make the following change in your medication:  1-START Torsemide 20 mg by mouth twice daily. 2-Take 1 Nitroglycerin, under your tongue, while sitting.  If no relief of pain may repeat Nitroglycerin, one tab every 5 minutes up to 3 tablets total over 15 minutes.  If no relief CALL 911.  If you have dizziness/lightheadness  while taking Nitroglycerin, stop taking and call 911.    *If you need a refill on your cardiac medications before your next appointment, please call your pharmacy*  Lab Work: Your physician recommends that you return for lab work in: 1 week BMET  If you have labs (blood work) drawn today and your tests are completely normal, you will receive your results only by: [place] (if you have MyChart) OR A paper copy in the mail If you have any lab test that is abnormal or we need to change your treatment, we will call you to review the results.  Follow-Up: At [facility], you and your health needs are our priority.  As part of our continuing mission to provide you with exceptional heart care, we have created designated Provider Care Teams.  These Care Teams include your primary Cardiologist (physician) and Advanced Practice Providers (APPs -  Physician Assistants and Nurse Practitioners) who all work together to provide you with the care you need, when you need it.  We recommend signing up for the patient portal called "MyChart".  Sign up information is provided on this After Visit Summary.  MyChart is used to connect with patients for Virtual Visits (Telemedicine).  Patients are able to view lab/test results, encounter notes, upcoming appointments, etc.  Non-urgent messages can be sent to your provider as well.   To learn more about what you can do with MyChart, go to [email or web address].    Your next appointment:   1 week(s)  The format for your next appointment:   In Person  Provider:   [name], MD      Important Information About Sugar

## 2022-02-22 ENCOUNTER — Ambulatory Visit: Payer: Medicare Other | Attending: Cardiovascular Disease | Admitting: Cardiovascular Disease

## 2022-02-22 ENCOUNTER — Encounter: Payer: Self-pay | Admitting: Cardiovascular Disease

## 2022-02-22 VITALS — BP 128/84 | HR 73 | Ht 69.0 in | Wt 224.8 lb

## 2022-02-22 DIAGNOSIS — I5031 Acute diastolic (congestive) heart failure: Secondary | ICD-10-CM | POA: Diagnosis not present

## 2022-02-22 DIAGNOSIS — D689 Coagulation defect, unspecified: Secondary | ICD-10-CM | POA: Diagnosis not present

## 2022-02-22 DIAGNOSIS — I2510 Atherosclerotic heart disease of native coronary artery without angina pectoris: Secondary | ICD-10-CM | POA: Diagnosis not present

## 2022-02-22 DIAGNOSIS — Z79899 Other long term (current) drug therapy: Secondary | ICD-10-CM | POA: Diagnosis not present

## 2022-02-22 DIAGNOSIS — Z01812 Encounter for preprocedural laboratory examination: Secondary | ICD-10-CM | POA: Insufficient documentation

## 2022-02-22 MED ORDER — TORSEMIDE 20 MG PO TABS: 20.0000 mg | ORAL_TABLET | Freq: Every day | ORAL | 3 refills | Status: DC

## 2022-02-22 NOTE — Progress Notes (Signed)
Cardiology Office Note:    Date:  02/22/2022   ID:  [name], DOB 1940-07-19, MRN [id number]  PCP:  [name], [place] Providers Cardiologist:  [name], MD {    Referring MD: [name], MD   Chief Complaint  Patient presents with   Coronary Artery Disease          History of Present Illness:    [name] is a 81 y.o. male with a hx of coronary artery disease, status post stent placement, congestive heart failure  He presents today for further evaluation and management of increasing shortness of breath.  Echo ( [place] - images not available ) report indicates he has normal LV systolic function,  grade I diastolic dysfunction.    Sept. 14, 2023  Here with worsening dyspnea  Chest pressure  With waking , feels presyncopal  Feels unsteady on his feet.   Had a stent placed several years ago Symptoms might be similar ,  Is now on Repatha   The progressive DOE / chest pressure has been present for several months   Eats sausage and bologna several times a week .   Medical doctor started Lasix 40 mg a day  Does not urinate much with that. Wt is 229  Sept. 15, 2023 [name] is seen for follow up of his dyspnea and chest tightness He was seen yesterday with apparent volume overload + rales bilaterally  Eats a very salty diet every day - Ive asked him to cut out his hot dogs and bologna   We started him on torsemide  ( instead of furosemide ) when he went to his pharmacy they stated that they did not have the torsemide ordered.  We have [facility] that [facility]  Since he was not able to take the torsemide he doubled up on his Lasix.  He put out quite a bit more urine.  His weight today is 226 pounds which is down 3 pounds from yesterday.  He states that he is not really breathing all that much better.   Sept. 19, 2023  [name] is seen today for follow up of his CHF   Has reduced his salt intake. Wt is 224 lbs today ( down 2 lbs)  Eating salad, lots of fruit ( will need to check BMP / potassium today )  He still is not breathing well.  He still gets these episodes of hot sweats.  He still has severe shortness of breath with any activity.  We will schedule him for right and left heart catheterization.  We have discussed the risk, benefits, options of heart catheterization.  He understands and agrees to proceed.    Past Medical History:  Diagnosis Date   Adenomatous colon polyp    Arthritis    CAD (coronary artery disease)    a. MI in 1996 with stent placement b. cath 01/18/17 - S/p PCI of in-stent restenosis of pLAD with cutting ballon & DES; 20% ostial LAD; 40% pro Cx; 60% focal pRCA   Clostridium difficile infection    Depression    Diverticulosis    H. pylori infection    HTN (hypertension)    Hyperlipidemia    MI (myocardial infarction) [facility])     Past Surgical History:  Procedure Laterality Date   [place]   benign tumor in back of head, done at  [facility]  COLONOSCOPY     hx of polyps   CORONARY ANGIOPLASTY WITH STENT PLACEMENT  1996; 1997; 01/18/2017   CORONARY STENT INTERVENTION N/A 01/18/2017   Procedure: CORONARY STENT INTERVENTION;  Surgeon: Troy Sine, MD;  Location: [place] [facility];  Service: Cardiovascular;  Laterality: N/A;   ESOPHAGOGASTRODUODENOSCOPY (EGD) WITH PROPOFOL N/A 06/14/2018   Procedure: ESOPHAGOGASTRODUODENOSCOPY (EGD) WITH PROPOFOL;  Surgeon: Gatha Mayer, MD;  Location: [facility];  Service: Endoscopy;  Laterality: N/A;   INTRAVASCULAR PRESSURE WIRE/FFR STUDY N/A 07/28/2020   Procedure: INTRAVASCULAR PRESSURE WIRE/FFR STUDY;  Surgeon: Leonie Man, MD;  Location: [place] [facility];  Service: Cardiovascular;  Laterality: N/A;   JOINT REPLACEMENT     LEFT HEART CATH AND CORONARY ANGIOGRAPHY N/A 01/18/2017   Procedure: LEFT HEART CATH AND CORONARY ANGIOGRAPHY;   Surgeon: Troy Sine, MD;  Location: [place] [facility];  Service: Cardiovascular;  Laterality: N/A;   LEFT HEART CATH AND CORONARY ANGIOGRAPHY N/A 07/28/2020   Procedure: LEFT HEART CATH AND CORONARY ANGIOGRAPHY;  Surgeon: Leonie Man, MD;  Location: [place] [facility];  Service: Cardiovascular;  Laterality: N/A;   stent x2 in the LAD  with intra-aortic balloon pump support  1996   TONSILLECTOMY     TOTAL KNEE ARTHROPLASTY Bilateral     Current Medications: Current Meds  Medication Sig   clopidogrel (PLAVIX) 75 MG tablet TAKE ONE TABLET BY MOUTH ONCE DAILY   escitalopram (LEXAPRO) 20 MG tablet Take 20 mg by mouth daily.   fenofibrate (TRICOR) 145 MG tablet TAKE ONE TABLET BY MOUTH ONCE DAILY   isosorbide mononitrate (IMDUR) 30 MG 24 hr tablet TAKE 1 AND 1/2 TABLETS BY MOUTH ONCE DAILY   meloxicam (MOBIC) 7.5 MG tablet Take 7.5 mg by mouth daily.   nitroGLYCERIN (NITROSTAT) 0.4 MG SL tablet Place 1 tablet (0.4 mg total) under the tongue every 5 (five) minutes as needed for chest pain.   pantoprazole (PROTONIX) 40 MG tablet Take 40 mg by mouth daily.   potassium chloride SA (KLOR-CON M) 20 MEQ tablet Take 20 mEq by mouth daily.   REPATHA SURECLICK 947 MG/ML SOAJ Inject 1 mL into the skin every 14 (fourteen) days.   rosuvastatin (CRESTOR) 20 MG tablet Take 1 tablet (20 mg total) by mouth daily.   [DISCONTINUED] torsemide (DEMADEX) 20 MG tablet Take 1 tablet (20 mg total) by mouth 2 (two) times daily.   Current Facility-Administered Medications for the 02/22/22 encounter (Office Visit) with Roy Tokarz, Wonda Cheng, MD  Medication   nitroGLYCERIN (NITROSTAT) SL tablet 0.4 mg     Allergies:   Gabapentin   Social History   Socioeconomic History   Marital status: Widowed    Spouse name: Not on file   Number of children: 1   Years of education: Not on file   Highest education level: Not on file  Occupational History   Occupation: Retired  Tobacco Use   Smoking status: Former     Types: Cigarettes    Quit date: 06/06/1994    Years since quitting: 27.7   Smokeless tobacco: Never  Vaping Use   Vaping Use: Never used  Substance and Sexual Activity   Alcohol use: No   Drug use: No   Sexual activity: Yes  Other Topics Concern   Not on file  Social History Narrative   Not on file   Social Determinants of Health   Financial Resource Strain: Not on file  Food Insecurity: Not on file  Transportation Needs: Not  on file  Physical Activity: Not on file  Stress: Not on file  Social Connections: Not on file     Family History: The patient's family history includes Cancer in his sister; Heart attack in his mother; Heart attack (age of onset: 68) in his brother. There is no history of Colon cancer, Stomach cancer, Rectal cancer, or Esophageal cancer.  ROS:   Please see the history of present illness.     All other systems reviewed and are negative.  EKGs/Labs/Other Studies Reviewed:    The following studies were reviewed today:   EKG:      Recent Labs: 12/21/2021: ALT 19 02/22/2022: BUN 37; Creatinine, Ser 2.18; Hemoglobin WILL FOLLOW; Platelets WILL FOLLOW; Potassium 3.9; Sodium 140  Recent Lipid Panel    Component Value Date/Time   CHOL 195 12/21/2021 1059   TRIG 119 12/21/2021 1059   HDL 56 12/21/2021 1059   CHOLHDL 3.5 12/21/2021 1059   CHOLHDL 4 06/23/2009 1147   VLDL 23.4 06/23/2009 1147   LDLCALC 118 (H) 12/21/2021 1059   LDLDIRECT 159.1 12/10/2007 0851     Risk Assessment/Calculations:               Physical Exam:    Physical Exam: Blood pressure 128/84, pulse 73, height '5\' 9"'$  (1.753 m), weight 224 lb 12.8 oz (102 kg), SpO2 93 %.       GEN:  elderly male, well developed in no acute distress HEENT: Normal NECK: No JVD; No carotid bruits LYMPHATICS: No lymphadenopathy CARDIAC: RRR , no murmurs, rubs, gallops RESPIRATORY:  chronic basilar rales  ABDOMEN: Soft, non-tender, non-distended MUSCULOSKELETAL:  No edema; No deformity   SKIN: Warm and dry NEUROLOGIC:  Alert and oriented x 3    ASSESSMENT:    1. Pre-procedure lab exam   2. Medication management   3. Coagulation disorder ([place])   4. Acute diastolic heart failure (HCC)      PLAN:    In order of problems listed above:  Acute on chronic diastolic congestive heart failure: . [name] continues to have symptoms of congestive heart failure.  I have treated him fairly aggressively with torsemide for the past 5 days.  Despite this, he still has pulmonary rales in the bases and still has shortness of breath/generalized weakness with any sort of exertion.  I will make sure that he does not have worsening coronary artery disease.  We will schedule him for right and left heart catheterization.  We discussed the risk, benefits, options of heart catheterization.  He understands and agrees to proceed.  2.  Mild chronic kidney disease: He has a creatinine of around 1.4.  I have been aggressively diuresing him so I expect the creatinine to be a little bit higher.  We will schedule a heart catheterization for either lunch or early afternoon so that he can come in early for 4 hours of hydration before the cath.       Medication Adjustments/Labs and Tests Ordered: Current medicines are reviewed at length with the patient today.  Concerns regarding medicines are outlined above.  Orders Placed This Encounter  Procedures   Basic metabolic panel   CBC   Meds ordered this encounter  Medications   torsemide (DEMADEX) 20 MG tablet    Sig: Take 1 tablet (20 mg total) by mouth daily.    Dispense:  180 tablet    Refill:  3    Replaces furosemide     Patient Instructions  Medication Instructions:  DECREASE TORSEMIDE TO 20  MG DAILY  *If you need a refill on your cardiac medications before your next appointment, please call your pharmacy*   Lab Work: TODAY BMET AND CBC If you have labs (blood work) drawn today and your tests are completely normal, you will  receive your results only by: [place] (if you have MyChart) OR A paper copy in the mail If you have any lab test that is abnormal or we need to change your treatment, we will call you to review the results.   Testing/Procedures: NONE    Follow-Up: At [facility], you and your health needs are our priority.  As part of our continuing mission to provide you with exceptional heart care, we have created designated Provider Care Teams.  These Care Teams include your primary Cardiologist (physician) and Advanced Practice Providers (APPs -  Physician Assistants and Nurse Practitioners) who all work together to provide you with the care you need, when you need it.  We recommend signing up for the patient portal called "MyChart".  Sign up information is provided on this After Visit Summary.  MyChart is used to connect with patients for Virtual Visits (Telemedicine).  Patients are able to view lab/test results, encounter notes, upcoming appointments, etc.  Non-urgent messages can be sent to your provider as well.   To learn more about what you can do with MyChart, go to [email or web address].    Your next appointment:   4 week(s)  The format for your next appointment:   In Person  Provider:   [name], MD      Other Instructions    Cardiac/Peripheral Catheterization   You are scheduled for a Cardiac Catheterization on Friday, September 22 with Dr. Lenna Sciara.  1. Please arrive at the Main Entrance A at [facility]: [address] [place], [place] [address] on September 22 at 8:00 AM Free valet parking service is available. You will check in at ADMITTING. The support person will be asked to wait in the waiting room.  It is OK to have someone drop you off and come back when you are ready to be discharged.        Special note: Every effort is made to have your procedure done on time. Please understand that emergencies sometimes delay scheduled  procedures.   . 2. Diet: Do not eat solid foods after midnight.  You may have clear liquids until 5 AM the day of the procedure.  3. Labs: You will need to have blood drawn on DONE 9/19 . You do not need to be fasting.  4. Medication instructions in preparation for your procedure:   Contrast Allergy: No   HOLD TORSEMIDE AM OF CATH      On the morning of your procedure, take Aspirin 81 mg and any morning medicines NOT listed above.  You may use sips of water.  5. Plan to go home the same day, you will only stay overnight if medically necessary. 6. You MUST have a responsible adult to drive you home. 7. An adult MUST be with you the first 24 hours after you arrive home. 8. Bring a current list of your medications, and the last time and date medication taken. 9. Bring ID and current insurance cards. 10.Please wear clothes that are easy to get on and off and wear slip-on shoes.  Thank you for allowing [place] to care for you!   -- [place] [facility]   Important Information About Sugar  Signed, [name], MD  02/22/2022 10:12 PM    [place]

## 2022-02-22 NOTE — Patient Instructions (Signed)
Medication Instructions:  DECREASE TORSEMIDE TO 20 MG DAILY  *If you need a refill on your cardiac medications before your next appointment, please call your pharmacy*   Lab Work: TODAY BMET AND CBC If you have labs (blood work) drawn today and your tests are completely normal, you will receive your results only by: [place] (if you have MyChart) OR A paper copy in the mail If you have any lab test that is abnormal or we need to change your treatment, we will call you to review the results.   Testing/Procedures: NONE    Follow-Up: At [facility], you and your health needs are our priority.  As part of our continuing mission to provide you with exceptional heart care, we have created designated Provider Care Teams.  These Care Teams include your primary Cardiologist (physician) and Advanced Practice Providers (APPs -  Physician Assistants and Nurse Practitioners) who all work together to provide you with the care you need, when you need it.  We recommend signing up for the patient portal called "MyChart".  Sign up information is provided on this After Visit Summary.  MyChart is used to connect with patients for Virtual Visits (Telemedicine).  Patients are able to view lab/test results, encounter notes, upcoming appointments, etc.  Non-urgent messages can be sent to your provider as well.   To learn more about what you can do with MyChart, go to [email or web address].    Your next appointment:   4 week(s)  The format for your next appointment:   In Person  Provider:   [name], MD      Other Instructions    Cardiac/Peripheral Catheterization   You are scheduled for a Cardiac Catheterization on Friday, September 22 with Dr. Lenna Sciara.  1. Please arrive at the Main Entrance A at [facility]: [address] [place], [place] [address] on September 22 at 8:00 AM Free valet parking service is available. You will check in at ADMITTING. The support  person will be asked to wait in the waiting room.  It is OK to have someone drop you off and come back when you are ready to be discharged.        Special note: Every effort is made to have your procedure done on time. Please understand that emergencies sometimes delay scheduled procedures.   . 2. Diet: Do not eat solid foods after midnight.  You may have clear liquids until 5 AM the day of the procedure.  3. Labs: You will need to have blood drawn on DONE 9/19 . You do not need to be fasting.  4. Medication instructions in preparation for your procedure:   Contrast Allergy: No   HOLD TORSEMIDE AM OF CATH      On the morning of your procedure, take Aspirin 81 mg and any morning medicines NOT listed above.  You may use sips of water.  5. Plan to go home the same day, you will only stay overnight if medically necessary. 6. You MUST have a responsible adult to drive you home. 7. An adult MUST be with you the first 24 hours after you arrive home. 8. Bring a current list of your medications, and the last time and date medication taken. 9. Bring ID and current insurance cards. 10.Please wear clothes that are easy to get on and off and wear slip-on shoes.  Thank you for allowing [place] to care for you!   -- [place] [facility]   Important Information  About Sugar

## 2022-02-22 NOTE — H&P (View-Only) (Signed)
Cardiology Office Note:    Date:  02/22/2022   ID:  [name], DOB 08-Oct-1940, MRN [id number]  PCP:  [name], [place] Prov[facility] Cardiologist:  [name], MD {    Referring MD: [name], MD   Chief Complaint  Patient presents with   Coronary Artery Disease          History of Present Illness:    VERNICE BOWKER is a 81 y.o. male with a hx of coronary artery disease, status post stent placement, congestive heart failure  He presents today for further evaluation and management of increasing shortness of breath.  Echo ( [place] - images not available ) report indicates he has normal LV systolic function,  grade I diastolic dysfunction.    Sept. 14, 2023  Here with worsening dyspnea  Chest pressure  With waking , feels presyncopal  Feels unsteady on his feet.   Had a stent placed several years ago Symptoms might be similar ,  Is now on Repatha   The progressive DOE / chest pressure has been present for several months   Eats sausage and bologna several times a week .   Medical doctor started Lasix 40 mg a day  Does not urinate much with that. Wt is 229  Sept. 15, 2023 Tyreon is seen for follow up of his dyspnea and chest tightness He was seen yesterday with apparent volume overload + rales bilaterally  Eats a very salty diet every day - Ive asked him to cut out his hot dogs and bologna   We started him on torsemide  ( instead of furosemide ) when he went to his pharmacy they stated that they did not have the torsemide ordered.  We have [facility] that [facility]  Since he was not able to take the torsemide he doubled up on his Lasix.  He put out quite a bit more urine.  His weight today is 226 pounds which is down 3 pounds from yesterday.  He states that he is not really breathing all that much better.   Sept. 19, 2023  Menachem is seen today for follow up of his CHF   Has reduced his salt intake. Wt is 224 lbs today ( down 2 lbs)  Eating salad, lots of fruit ( will need to check BMP / potassium today )  He still is not breathing well.  He still gets these episodes of hot sweats.  He still has severe shortness of breath with any activity.  We will schedule him for right and left heart catheterization.  We have discussed the risk, benefits, options of heart catheterization.  He understands and agrees to proceed.    Past Medical History:  Diagnosis Date   Adenomatous colon polyp    Arthritis    CAD (coronary artery disease)    a. MI in 1996 with stent placement b. cath 01/18/17 - S/p PCI of in-stent restenosis of pLAD with cutting ballon & DES; 20% ostial LAD; 40% pro Cx; 60% focal pRCA   Clostridium difficile infection    Depression    Diverticulosis    H. pylori infection    HTN (hypertension)    Hyperlipidemia    MI (myocardial infarction) [facility])     Past Surgical History:  Procedure Laterality Date   [place]   benign tumor in back of head, done at  [place]  COLONOSCOPY     hx of polyps   CORONARY ANGIOPLASTY WITH STENT PLACEMENT  1996; 1997; 01/18/2017   CORONARY STENT INTERVENTION N/A 01/18/2017   Procedure: CORONARY STENT INTERVENTION;  Surgeon: Troy Sine, MD;  Location: [facility];  Service: Cardiovascular;  Laterality: N/A;   ESOPHAGOGASTRODUODENOSCOPY (EGD) WITH PROPOFOL N/A 06/14/2018   Procedure: ESOPHAGOGASTRODUODENOSCOPY (EGD) WITH PROPOFOL;  Surgeon: Gatha Mayer, MD;  Location: [facility];  Service: Endoscopy;  Laterality: N/A;   INTRAVASCULAR PRESSURE WIRE/FFR STUDY N/A 07/28/2020   Procedure: INTRAVASCULAR PRESSURE WIRE/FFR STUDY;  Surgeon: Leonie Man, MD;  Location: [facility];  Service: Cardiovascular;  Laterality: N/A;   JOINT REPLACEMENT     LEFT HEART CATH AND CORONARY ANGIOGRAPHY N/A 01/18/2017   Procedure: LEFT HEART CATH AND CORONARY ANGIOGRAPHY;   Surgeon: Troy Sine, MD;  Location: [facility];  Service: Cardiovascular;  Laterality: N/A;   LEFT HEART CATH AND CORONARY ANGIOGRAPHY N/A 07/28/2020   Procedure: LEFT HEART CATH AND CORONARY ANGIOGRAPHY;  Surgeon: Leonie Man, MD;  Location: [facility];  Service: Cardiovascular;  Laterality: N/A;   stent x2 in the LAD  with intra-aortic balloon pump support  1996   TONSILLECTOMY     TOTAL KNEE ARTHROPLASTY Bilateral     Current Medications: Current Meds  Medication Sig   clopidogrel (PLAVIX) 75 MG tablet TAKE ONE TABLET BY MOUTH ONCE DAILY   escitalopram (LEXAPRO) 20 MG tablet Take 20 mg by mouth daily.   fenofibrate (TRICOR) 145 MG tablet TAKE ONE TABLET BY MOUTH ONCE DAILY   isosorbide mononitrate (IMDUR) 30 MG 24 hr tablet TAKE 1 AND 1/2 TABLETS BY MOUTH ONCE DAILY   meloxicam (MOBIC) 7.5 MG tablet Take 7.5 mg by mouth daily.   nitroGLYCERIN (NITROSTAT) 0.4 MG SL tablet Place 1 tablet (0.4 mg total) under the tongue every 5 (five) minutes as needed for chest pain.   pantoprazole (PROTONIX) 40 MG tablet Take 40 mg by mouth daily.   potassium chloride SA (KLOR-CON M) 20 MEQ tablet Take 20 mEq by mouth daily.   REPATHA SURECLICK 161 MG/ML SOAJ Inject 1 mL into the skin every 14 (fourteen) days.   rosuvastatin (CRESTOR) 20 MG tablet Take 1 tablet (20 mg total) by mouth daily.   [DISCONTINUED] torsemide (DEMADEX) 20 MG tablet Take 1 tablet (20 mg total) by mouth 2 (two) times daily.   Current Facility-Administered Medications for the 02/22/22 encounter (Office Visit) with Elbert Polyakov, Wonda Cheng, MD  Medication   nitroGLYCERIN (NITROSTAT) SL tablet 0.4 mg     Allergies:   Gabapentin   Social History   Socioeconomic History   Marital status: Widowed    Spouse name: Not on file   Number of children: 1   Years of education: Not on file   Highest education level: Not on file  Occupational History   Occupation: Retired  Tobacco Use   Smoking status: Former     Types: Cigarettes    Quit date: 06/06/1994    Years since quitting: 27.7   Smokeless tobacco: Never  Vaping Use   Vaping Use: Never used  Substance and Sexual Activity   Alcohol use: No   Drug use: No   Sexual activity: Yes  Other Topics Concern   Not on file  Social History Narrative   Not on file   Social Determinants of Health   Financial Resource Strain: Not on file  Food Insecurity: Not on file  Transportation Needs: Not  on file  Physical Activity: Not on file  Stress: Not on file  Social Connections: Not on file     Family History: The patient's family history includes Cancer in his sister; Heart attack in his mother; Heart attack (age of onset: 4) in his brother. There is no history of Colon cancer, Stomach cancer, Rectal cancer, or Esophageal cancer.  ROS:   Please see the history of present illness.     All other systems reviewed and are negative.  EKGs/Labs/Other Studies Reviewed:    The following studies were reviewed today:   EKG:      Recent Labs: 12/21/2021: ALT 19 02/22/2022: BUN 37; Creatinine, Ser 2.18; Hemoglobin WILL FOLLOW; Platelets WILL FOLLOW; Potassium 3.9; Sodium 140  Recent Lipid Panel    Component Value Date/Time   CHOL 195 12/21/2021 1059   TRIG 119 12/21/2021 1059   HDL 56 12/21/2021 1059   CHOLHDL 3.5 12/21/2021 1059   CHOLHDL 4 06/23/2009 1147   VLDL 23.4 06/23/2009 1147   LDLCALC 118 (H) 12/21/2021 1059   LDLDIRECT 159.1 12/10/2007 0851     Risk Assessment/Calculations:               Physical Exam:    Physical Exam: Blood pressure 128/84, pulse 73, height '5\' 9"'$  (1.753 m), weight 224 lb 12.8 oz (102 kg), SpO2 93 %.       GEN:  elderly male, well developed in no acute distress HEENT: Normal NECK: No JVD; No carotid bruits LYMPHATICS: No lymphadenopathy CARDIAC: RRR , no murmurs, rubs, gallops RESPIRATORY:  chronic basilar rales  ABDOMEN: Soft, non-tender, non-distended MUSCULOSKELETAL:  No edema; No deformity   SKIN: Warm and dry NEUROLOGIC:  Alert and oriented x 3    ASSESSMENT:    1. Pre-procedure lab exam   2. Medication management   3. Coagulation disorder ([place])   4. Acute diastolic heart failure (HCC)      PLAN:    In order of problems listed above:  Acute on chronic diastolic congestive heart failure: . Clell continues to have symptoms of congestive heart failure.  I have treated him fairly aggressively with torsemide for the past 5 days.  Despite this, he still has pulmonary rales in the bases and still has shortness of breath/generalized weakness with any sort of exertion.  I will make sure that he does not have worsening coronary artery disease.  We will schedule him for right and left heart catheterization.  We discussed the risk, benefits, options of heart catheterization.  He understands and agrees to proceed.  2.  Mild chronic kidney disease: He has a creatinine of around 1.4.  I have been aggressively diuresing him so I expect the creatinine to be a little bit higher.  We will schedule a heart catheterization for either lunch or early afternoon so that he can come in early for 4 hours of hydration before the cath.       Medication Adjustments/Labs and Tests Ordered: Current medicines are reviewed at length with the patient today.  Concerns regarding medicines are outlined above.  Orders Placed This Encounter  Procedures   Basic metabolic panel   CBC   Meds ordered this encounter  Medications   torsemide (DEMADEX) 20 MG tablet    Sig: Take 1 tablet (20 mg total) by mouth daily.    Dispense:  180 tablet    Refill:  3    Replaces furosemide     Patient Instructions  Medication Instructions:  DECREASE TORSEMIDE TO 20  MG DAILY  *If you need a refill on your cardiac medications before your next appointment, please call your pharmacy*   Lab Work: TODAY BMET AND CBC If you have labs (blood work) drawn today and your tests are completely normal, you will  receive your results only by: [place] (if you have MyChart) OR A paper copy in the mail If you have any lab test that is abnormal or we need to change your treatment, we will call you to review the results.   Testing/Procedures: NONE    Follow-Up: At [facility], you and your health needs are our priority.  As part of our continuing mission to provide you with exceptional heart care, we have created designated Provider Care Teams.  These Care Teams include your primary Cardiologist (physician) and Advanced Practice Providers (APPs -  Physician Assistants and Nurse Practitioners) who all work together to provide you with the care you need, when you need it.  We recommend signing up for the patient portal called "MyChart".  Sign up information is provided on this After Visit Summary.  MyChart is used to connect with patients for Virtual Visits (Telemedicine).  Patients are able to view lab/test results, encounter notes, upcoming appointments, etc.  Non-urgent messages can be sent to your provider as well.   To learn more about what you can do with MyChart, go to [email or web address].    Your next appointment:   4 week(s)  The format for your next appointment:   In Person  Provider:   [name], MD      Other Instructions    Cardiac/Peripheral Catheterization   You are scheduled for a Cardiac Catheterization on Friday, September 22 with Dr. Lenna Sciara.  1. Please arrive at the Main Entrance A at [facility]: [address] [place], [place] [address] on September 22 at 8:00 AM Free valet parking service is available. You will check in at ADMITTING. The support person will be asked to wait in the waiting room.  It is OK to have someone drop you off and come back when you are ready to be discharged.        Special note: Every effort is made to have your procedure done on time. Please understand that emergencies sometimes delay scheduled  procedures.   . 2. Diet: Do not eat solid foods after midnight.  You may have clear liquids until 5 AM the day of the procedure.  3. Labs: You will need to have blood drawn on DONE 9/19 . You do not need to be fasting.  4. Medication instructions in preparation for your procedure:   Contrast Allergy: No   HOLD TORSEMIDE AM OF CATH      On the morning of your procedure, take Aspirin 81 mg and any morning medicines NOT listed above.  You may use sips of water.  5. Plan to go home the same day, you will only stay overnight if medically necessary. 6. You MUST have a responsible adult to drive you home. 7. An adult MUST be with you the first 24 hours after you arrive home. 8. Bring a current list of your medications, and the last time and date medication taken. 9. Bring ID and current insurance cards. 10.Please wear clothes that are easy to get on and off and wear slip-on shoes.  Thank you for allowing [place] to care for you!   -- [facility]   Important Information About Sugar  Signed, [name], MD  02/22/2022 10:12 PM    [place]

## 2022-02-23 ENCOUNTER — Telehealth: Payer: Self-pay

## 2022-02-23 ENCOUNTER — Telehealth: Payer: Self-pay | Admitting: *Deleted

## 2022-02-23 DIAGNOSIS — Z79899 Other long term (current) drug therapy: Secondary | ICD-10-CM

## 2022-02-23 LAB — CBC
Hematocrit: 45.4 % (ref 37.5–51.0)
Hemoglobin: 15.2 g/dL (ref 13.0–17.7)
MCH: 30.4 pg (ref 26.6–33.0)
MCHC: 33.5 g/dL (ref 31.5–35.7)
MCV: 91 fL (ref 79–97)
Platelets: 229 10*3/uL (ref 150–450)
RBC: 5 x10E6/uL (ref 4.14–5.80)
RDW: 13.4 % (ref 11.6–15.4)
WBC: 5.9 10*3/uL (ref 3.4–10.8)

## 2022-02-23 LAB — BASIC METABOLIC PANEL
BUN/Creatinine Ratio: 17 (ref 10–24)
BUN: 37 mg/dL — ABNORMAL HIGH (ref 8–27)
CO2: 33 mmol/L — ABNORMAL HIGH (ref 20–29)
Calcium: 9.5 mg/dL (ref 8.6–10.2)
Chloride: 94 mmol/L — ABNORMAL LOW (ref 96–106)
Creatinine, Ser: 2.18 mg/dL — ABNORMAL HIGH (ref 0.76–1.27)
Glucose: 140 mg/dL — ABNORMAL HIGH (ref 70–99)
Potassium: 3.9 mmol/L (ref 3.5–5.2)
Sodium: 140 mmol/L (ref 134–144)
eGFR: 30 mL/min/{1.73_m2} — ABNORMAL LOW (ref >59)

## 2022-02-23 NOTE — Telephone Encounter (Signed)
See other phone note today. Patient states he is aware of instruction to hold torsemide/KCL today and tomorrow, repeat BMP Friday 02/25/22.

## 2022-02-23 NOTE — Telephone Encounter (Signed)
Called and spoke with patient in detail. He did verbal read-back of instructions to hold Torsemide and Potassium for 2 days (today and tomorrow) due to his kidney functioning. Repeat bmet has been ordered and scheduled for Friday 02/25/22. Pt knows to come here. Cath has been moved to Monday, 02/28/22 at 10:30 w/[place]. Pt will arrive at 5:30 for hydration.

## 2022-02-23 NOTE — Telephone Encounter (Signed)
Cardiac Catheterization scheduled at [facility] for: Monday February 28, 2022 10:30 AM Arrival time and place: [facility] Entrance A at: 5:30 AM-pre-procedure hydration  Nothing to eat after midnight prior to procedure, clear liquids until 5 AM day of procedure.  Medication instructions: -Hold:  Torsemide/KCl-day before and day of procedure-per protocol GFR 30*  Mobic-day before and day of procedure-per protocol GFR 30 -Except hold medications usual morning medications can be taken with sips of water including aspirin 81 mg and Plavix 75 mg.  Confirmed patient has responsible adult to drive home post procedure and be with patient first 24 hours after arriving home.  Patient reports no new symptoms concerning for COVID-19 in the past 10 days.

## 2022-02-23 NOTE — Telephone Encounter (Signed)
-----   Message from [name], MD sent at 02/22/2022  6:24 PM EDT ----- As expected his creatinine is up from the aggressive diuresis.  We will hold torsemide and hold potassium for 2 days and have her repeat the basic metabolic profile on Friday.  Please reschedule heart catheterization for early next week.

## 2022-02-25 ENCOUNTER — Ambulatory Visit: Payer: Medicare Other | Attending: Cardiovascular Disease

## 2022-02-25 ENCOUNTER — Telehealth: Payer: Self-pay | Admitting: Cardiovascular Disease

## 2022-02-25 ENCOUNTER — Other Ambulatory Visit: Payer: Medicare Other

## 2022-02-25 DIAGNOSIS — Z79899 Other long term (current) drug therapy: Secondary | ICD-10-CM

## 2022-02-25 LAB — BASIC METABOLIC PANEL
BUN/Creatinine Ratio: 16 (ref 10–24)
BUN: 21 mg/dL (ref 8–27)
CO2: 29 mmol/L (ref 20–29)
Calcium: 9.5 mg/dL (ref 8.6–10.2)
Chloride: 103 mmol/L (ref 96–106)
Creatinine, Ser: 1.3 mg/dL — ABNORMAL HIGH (ref 0.76–1.27)
Glucose: 122 mg/dL — ABNORMAL HIGH (ref 70–99)
Potassium: 4.5 mmol/L (ref 3.5–5.2)
Sodium: 141 mmol/L (ref 134–144)
eGFR: 56 mL/min/{1.73_m2} — ABNORMAL LOW (ref >59)

## 2022-02-25 NOTE — Telephone Encounter (Signed)
Spoke with pt who states he has had cold symptoms of congestion, runny nose and mild productive cough.  He has tested negative for Covid x 3.  He denies current fever, sore-throat, nausea, vomiting or diarrhea. Spoke with Dr Elmarie Shiley RN re: symptoms who recommends pt plan to move forward with labs today and heart cath scheduled for Monday 02/28/2022.  If symptoms increase over the weekend pt will call to reschedule procedure.

## 2022-02-25 NOTE — Telephone Encounter (Signed)
Patient states he has a cold and is supposed to get labs done today prior to his cath Monday. He would like to know if he needs to reschedule.

## 2022-02-28 ENCOUNTER — Other Ambulatory Visit: Payer: Self-pay

## 2022-02-28 ENCOUNTER — Encounter (HOSPITAL_COMMUNITY)
Admission: RE | Disposition: A | Discharge status: Home / Self Care | Payer: Self-pay | Source: Ambulatory Visit | Attending: Internal Medicine

## 2022-02-28 ENCOUNTER — Observation Stay (HOSPITAL_COMMUNITY)
Admission: RE | Admit: 2022-02-28 | Discharge: 2022-03-01 | Disposition: A | Discharge status: Home / Self Care | Payer: Medicare Other | Source: Ambulatory Visit | Attending: Internal Medicine | Admitting: Internal Medicine

## 2022-02-28 DIAGNOSIS — Z955 Presence of coronary angioplasty implant and graft: Secondary | ICD-10-CM | POA: Diagnosis not present

## 2022-02-28 DIAGNOSIS — I2510 Atherosclerotic heart disease of native coronary artery without angina pectoris: Secondary | ICD-10-CM

## 2022-02-28 DIAGNOSIS — I25119 Atherosclerotic heart disease of native coronary artery with unspecified angina pectoris: Secondary | ICD-10-CM | POA: Diagnosis not present

## 2022-02-28 DIAGNOSIS — Z96653 Presence of artificial knee joint, bilateral: Secondary | ICD-10-CM | POA: Insufficient documentation

## 2022-02-28 DIAGNOSIS — Z87891 Personal history of nicotine dependence: Secondary | ICD-10-CM | POA: Diagnosis not present

## 2022-02-28 DIAGNOSIS — Z79899 Other long term (current) drug therapy: Secondary | ICD-10-CM | POA: Diagnosis not present

## 2022-02-28 DIAGNOSIS — N189 Chronic kidney disease, unspecified: Secondary | ICD-10-CM | POA: Diagnosis not present

## 2022-02-28 DIAGNOSIS — I5033 Acute on chronic diastolic (congestive) heart failure: Secondary | ICD-10-CM | POA: Diagnosis not present

## 2022-02-28 DIAGNOSIS — I10 Essential (primary) hypertension: Secondary | ICD-10-CM | POA: Diagnosis present

## 2022-02-28 DIAGNOSIS — I209 Angina pectoris, unspecified: Secondary | ICD-10-CM | POA: Diagnosis present

## 2022-02-28 DIAGNOSIS — E785 Hyperlipidemia, unspecified: Secondary | ICD-10-CM | POA: Diagnosis present

## 2022-02-28 DIAGNOSIS — I130 Hypertensive heart and chronic kidney disease with heart failure and stage 1 through stage 4 chronic kidney disease, or unspecified chronic kidney disease: Secondary | ICD-10-CM | POA: Diagnosis not present

## 2022-02-28 DIAGNOSIS — Z7902 Long term (current) use of antithrombotics/antiplatelets: Secondary | ICD-10-CM | POA: Insufficient documentation

## 2022-02-28 DIAGNOSIS — R001 Bradycardia, unspecified: Secondary | ICD-10-CM

## 2022-02-28 DIAGNOSIS — I5032 Chronic diastolic (congestive) heart failure: Secondary | ICD-10-CM

## 2022-02-28 DIAGNOSIS — R0609 Other forms of dyspnea: Secondary | ICD-10-CM | POA: Diagnosis present

## 2022-02-28 HISTORY — PX: CORONARY ATHERECTOMY: CATH118238

## 2022-02-28 HISTORY — PX: INTRAVASCULAR PRESSURE WIRE/FFR STUDY: CATH118243

## 2022-02-28 HISTORY — PX: CORONARY STENT INTERVENTION: CATH118234

## 2022-02-28 HISTORY — PX: CORONARY LITHOTRIPSY: CATH118330

## 2022-02-28 HISTORY — PX: RIGHT/LEFT HEART CATH AND CORONARY ANGIOGRAPHY: CATH118266

## 2022-02-28 HISTORY — PX: INTRAVASCULAR IMAGING/OCT: CATH118326

## 2022-02-28 LAB — POCT I-STAT 7, (LYTES, BLD GAS, ICA,H+H)
Acid-Base Excess: 1 mmol/L (ref 0.0–2.0)
Bicarbonate: 27 mmol/L (ref 20.0–28.0)
Calcium, Ion: 1.25 mmol/L (ref 1.15–1.40)
HCT: 42 % (ref 39.0–52.0)
Hemoglobin: 14.3 g/dL (ref 13.0–17.0)
O2 Saturation: 97 %
Potassium: 4.3 mmol/L (ref 3.5–5.1)
Sodium: 141 mmol/L (ref 135–145)
TCO2: 28 mmol/L (ref 22–32)
pCO2 arterial: 48.8 mmHg — ABNORMAL HIGH (ref 32–48)
pH, Arterial: 7.351 (ref 7.35–7.45)
pO2, Arterial: 98 mmHg (ref 83–108)

## 2022-02-28 LAB — POCT ACTIVATED CLOTTING TIME
Activated Clotting Time: 293 seconds
Activated Clotting Time: 305 seconds
Activated Clotting Time: 197 seconds
Activated Clotting Time: 251 seconds
Activated Clotting Time: 432 seconds

## 2022-02-28 LAB — POCT I-STAT EG7
Acid-base deficit: 1 mmol/L (ref 0.0–2.0)
Acid-base deficit: 4 mmol/L — ABNORMAL HIGH (ref 0.0–2.0)
Bicarbonate: 23.9 mmol/L (ref 20.0–28.0)
Bicarbonate: 26.6 mmol/L (ref 20.0–28.0)
Calcium, Ion: 1.06 mmol/L — ABNORMAL LOW (ref 1.15–1.40)
Calcium, Ion: 1.19 mmol/L (ref 1.15–1.40)
HCT: 40 % (ref 39.0–52.0)
HCT: 42 % (ref 39.0–52.0)
Hemoglobin: 13.6 g/dL (ref 13.0–17.0)
Hemoglobin: 14.3 g/dL (ref 13.0–17.0)
O2 Saturation: 70 %
O2 Saturation: 73 %
Potassium: 3.7 mmol/L (ref 3.5–5.1)
Potassium: 4.1 mmol/L (ref 3.5–5.1)
Sodium: 138 mmol/L (ref 135–145)
Sodium: 142 mmol/L (ref 135–145)
TCO2: 26 mmol/L (ref 22–32)
TCO2: 28 mmol/L (ref 22–32)
pCO2, Ven: 52.4 mmHg (ref 44–60)
pCO2, Ven: 52.4 mmHg (ref 44–60)
pH, Ven: 7.267 (ref 7.25–7.43)
pH, Ven: 7.313 (ref 7.25–7.43)
pO2, Ven: 41 mmHg (ref 32–45)
pO2, Ven: 44 mmHg (ref 32–45)

## 2022-02-28 SURGERY — RIGHT/LEFT HEART CATH AND CORONARY ANGIOGRAPHY
Anesthesia: LOCAL

## 2022-02-28 MED ORDER — CLOPIDOGREL BISULFATE 300 MG PO TABS
ORAL_TABLET | ORAL | Status: AC
  Filled 2022-02-28: qty 1

## 2022-02-28 MED ORDER — SODIUM CHLORIDE 0.9 % IV SOLN: INTRAVENOUS | Status: DC

## 2022-02-28 MED ORDER — IOHEXOL 350 MG/ML SOLN
INTRAVENOUS | Status: DC | PRN
  Administered 2022-02-28: 144 mL

## 2022-02-28 MED ORDER — VERAPAMIL HCL 2.5 MG/ML IV SOLN: INTRAVENOUS | Status: DC | PRN

## 2022-02-28 MED ORDER — HEPARIN (PORCINE) IN NACL 1000-0.9 UT/500ML-% IV SOLN
INTRAVENOUS | Status: AC
  Filled 2022-02-28: qty 1000

## 2022-02-28 MED ORDER — HEPARIN SODIUM (PORCINE) 1000 UNIT/ML IJ SOLN
INTRAMUSCULAR | Status: AC
  Filled 2022-02-28: qty 10

## 2022-02-28 MED ORDER — SODIUM CHLORIDE 0.9% FLUSH: 3.0000 mL | INTRAVENOUS | Status: DC | PRN

## 2022-02-28 MED ORDER — ASPIRIN 81 MG PO CHEW
81.0000 mg | CHEWABLE_TABLET | ORAL | Status: AC
  Administered 2022-02-28: 81 mg via ORAL
  Filled 2022-02-28: qty 1

## 2022-02-28 MED ORDER — ACETAMINOPHEN 325 MG PO TABS: 650.0000 mg | ORAL_TABLET | ORAL | Status: DC | PRN

## 2022-02-28 MED ORDER — MIDAZOLAM HCL 2 MG/2ML IJ SOLN
INTRAMUSCULAR | Status: AC
  Filled 2022-02-28: qty 2

## 2022-02-28 MED ORDER — SODIUM CHLORIDE 0.9% FLUSH
3.0000 mL | Freq: Two times a day (BID) | INTRAVENOUS | Status: DC
  Administered 2022-02-28 – 2022-03-01 (×2): 3 mL via INTRAVENOUS

## 2022-02-28 MED ORDER — SODIUM CHLORIDE 0.9 % IV SOLN: INTRAVENOUS | Status: AC

## 2022-02-28 MED ORDER — ASPIRIN 81 MG PO CHEW
81.0000 mg | CHEWABLE_TABLET | Freq: Every day | ORAL | Status: DC
  Administered 2022-03-01: 81 mg via ORAL
  Filled 2022-02-28: qty 1

## 2022-02-28 MED ORDER — LABETALOL HCL 5 MG/ML IV SOLN: 10.0000 mg | INTRAVENOUS | Status: AC | PRN

## 2022-02-28 MED ORDER — CLOPIDOGREL BISULFATE 75 MG PO TABS
75.0000 mg | ORAL_TABLET | Freq: Every day | ORAL | Status: DC
  Administered 2022-03-01: 75 mg via ORAL
  Filled 2022-02-28: qty 1

## 2022-02-28 MED ORDER — CLOPIDOGREL BISULFATE 300 MG PO TABS
ORAL_TABLET | ORAL | Status: DC | PRN
  Administered 2022-02-28: 300 mg via ORAL

## 2022-02-28 MED ORDER — NITROGLYCERIN 1 MG/10 ML FOR IR/CATH LAB
INTRA_ARTERIAL | Status: AC
  Filled 2022-02-28: qty 10

## 2022-02-28 MED ORDER — VIPERSLIDE LUBRICANT OPTIME
TOPICAL | Status: DC | PRN
  Administered 2022-02-28: 20 mL via SURGICAL_CAVITY

## 2022-02-28 MED ORDER — CLOPIDOGREL BISULFATE 75 MG PO TABS
75.0000 mg | ORAL_TABLET | Freq: Once | ORAL | Status: AC
  Administered 2022-02-28: 75 mg via ORAL
  Filled 2022-02-28: qty 1

## 2022-02-28 MED ORDER — HEPARIN SODIUM (PORCINE) 1000 UNIT/ML IJ SOLN
INTRAMUSCULAR | Status: DC | PRN
  Administered 2022-02-28: 4000 [IU] via INTRAVENOUS
  Administered 2022-02-28: 5000 [IU] via INTRAVENOUS
  Administered 2022-02-28: 5000 [IU] via INTRA_ARTERIAL
  Administered 2022-02-28: 3000 [IU] via INTRAVENOUS

## 2022-02-28 MED ORDER — MIDAZOLAM HCL 2 MG/2ML IJ SOLN
INTRAMUSCULAR | Status: DC | PRN
  Administered 2022-02-28 (×3): 1 mg via INTRAVENOUS

## 2022-02-28 MED ORDER — HYDRALAZINE HCL 20 MG/ML IJ SOLN: 10.0000 mg | INTRAMUSCULAR | Status: AC | PRN

## 2022-02-28 MED ORDER — FENTANYL CITRATE (PF) 100 MCG/2ML IJ SOLN
INTRAMUSCULAR | Status: AC
  Filled 2022-02-28: qty 2

## 2022-02-28 MED ORDER — SODIUM CHLORIDE 0.9 % IV SOLN: 250.0000 mL | INTRAVENOUS | Status: DC | PRN

## 2022-02-28 MED ORDER — LIDOCAINE HCL (PF) 1 % IJ SOLN
INTRAMUSCULAR | Status: DC | PRN
  Administered 2022-02-28 (×3): 2 mL

## 2022-02-28 MED ORDER — SODIUM CHLORIDE 0.9% FLUSH: 3.0000 mL | Freq: Two times a day (BID) | INTRAVENOUS | Status: DC

## 2022-02-28 MED ORDER — LIDOCAINE HCL (PF) 1 % IJ SOLN
INTRAMUSCULAR | Status: AC
  Filled 2022-02-28: qty 30

## 2022-02-28 MED ORDER — VERAPAMIL HCL 2.5 MG/ML IV SOLN
INTRAVENOUS | Status: AC
  Filled 2022-02-28: qty 2

## 2022-02-28 MED ORDER — FENTANYL CITRATE (PF) 100 MCG/2ML IJ SOLN
INTRAMUSCULAR | Status: DC | PRN
  Administered 2022-02-28 (×3): 25 ug via INTRAVENOUS

## 2022-02-28 MED ORDER — ONDANSETRON HCL 4 MG/2ML IJ SOLN: 4.0000 mg | Freq: Four times a day (QID) | INTRAMUSCULAR | Status: DC | PRN

## 2022-02-28 MED ORDER — HEPARIN (PORCINE) IN NACL 1000-0.9 UT/500ML-% IV SOLN
INTRAVENOUS | Status: DC | PRN
  Administered 2022-02-28 (×3): 500 mL

## 2022-02-28 SURGICAL SUPPLY — 36 items
BALL SAPPHIRE NC24 3.25X8 (BALLOONS) ×1
BALL SAPPHIRE NC24 3.5X8 (BALLOONS) ×1
BALLOON SAPPHIRE NC24 3.25X8 (BALLOONS) IMPLANT
BALLOON SAPPHIRE NC24 3.5X8 (BALLOONS) IMPLANT
CATH BALLN WEDGE 5F 110CM (CATHETERS) IMPLANT
CATH DRAGONFLY OPSTAR (CATHETERS) IMPLANT
CATH LAUNCHER 6FR EBU3.5 (CATHETERS) IMPLANT
CATH LAUNCHER 6FR JR4 (CATHETERS) IMPLANT
CATH OPTITORQUE TIG 4.0 6F (CATHETERS) IMPLANT
CATH SHOCKWAVE 3.0X12 (CATHETERS) IMPLANT
CATH TELEPORT (CATHETERS) IMPLANT
CATHETER SHOCKWAVE 3.0X12 (CATHETERS) ×1
COVER SWIFTLINK CONNECTOR (BAG) IMPLANT
CROWN DIAMONDBACK CLASSIC 1.25 (BURR) IMPLANT
DEVICE RAD COMP TR BAND LRG (VASCULAR PRODUCTS) IMPLANT
GLIDESHEATH SLEND SS 6F .021 (SHEATH) IMPLANT
GUIDEWIRE INQWIRE 1.5J.035X260 (WIRE) IMPLANT
GUIDEWIRE PRESSURE X 175 (WIRE) IMPLANT
INQWIRE 1.5J .035X260CM (WIRE) ×1
KIT ENCORE 26 ADVANTAGE (KITS) IMPLANT
KIT HEART LEFT (KITS) ×1 IMPLANT
LUBRICANT VIPERSLIDE CORONARY (MISCELLANEOUS) IMPLANT
PACK CARDIAC CATHETERIZATION (CUSTOM PROCEDURE TRAY) ×1 IMPLANT
PROTECTION STATION PRESSURIZED (MISCELLANEOUS) ×1
SET ATX SIMPLICITY (MISCELLANEOUS) IMPLANT
SHEATH GLIDE SLENDER 4/5FR (SHEATH) IMPLANT
SHEATH PINNACLE 5F 10CM (SHEATH) IMPLANT
SHEATH PROBE COVER 6X72 (BAG) IMPLANT
STATION PROTECTION PRESSURIZED (MISCELLANEOUS) IMPLANT
STENT SYNERGY XD 3.0X12 (Permanent Stent) IMPLANT
SYNERGY XD 3.0X12 (Permanent Stent) ×1 IMPLANT
TUBING CIL FLEX 10 FLL-RA (TUBING) ×1 IMPLANT
WIRE ASAHI PROWATER 180CM (WIRE) IMPLANT
WIRE ASAHI SION 300CM (WIRE) IMPLANT
WIRE MICRO SET SILHO 5FR 7 (SHEATH) IMPLANT
WIRE VIPERWIRE COR FLEX .012 (WIRE) IMPLANT

## 2022-02-28 NOTE — Interval H&P Note (Signed)
History and Physical Interval Note:  02/28/2022 1:27 PM  [name]  has presented today for surgery, with the diagnosis of dypsnea on exertion and chest pain.  The various methods of treatment have been discussed with the patient and family. After consideration of risks, benefits and other options for treatment, the patient has consented to  Procedure(s): RIGHT/LEFT HEART CATH AND CORONARY ANGIOGRAPHY (N/A) as a surgical intervention.  The patient's history has been reviewed, patient examined, no change in status, stable for surgery.  I have reviewed the patient's chart and labs.  Questions were answered to the patient's satisfaction.     Early [place]

## 2022-02-28 NOTE — Progress Notes (Addendum)
SITE AREA: right neck  SITE PRIOR TO REMOVAL:  LEVEL 0  PRESSURE APPLIED FOR: approximately 10 minutes  MANUAL: yes  PATIENT STATUS DURING PULL: stable  POST PULL SITE:  LEVEL 0  POST PULL INSTRUCTIONS GIVEN: yes  POST PULL PULSES PRESENT: bilateral radial pulses at +2, palpable  DRESSING APPLIED: vaseline gauze, with 2x2 gauze, and tegaderm   COMMENTS: suture removed

## 2022-03-01 ENCOUNTER — Encounter (HOSPITAL_COMMUNITY): Payer: Self-pay | Admitting: Internal Medicine

## 2022-03-01 DIAGNOSIS — I5033 Acute on chronic diastolic (congestive) heart failure: Secondary | ICD-10-CM | POA: Diagnosis not present

## 2022-03-01 DIAGNOSIS — Z955 Presence of coronary angioplasty implant and graft: Secondary | ICD-10-CM

## 2022-03-01 DIAGNOSIS — I209 Angina pectoris, unspecified: Secondary | ICD-10-CM

## 2022-03-01 DIAGNOSIS — R001 Bradycardia, unspecified: Secondary | ICD-10-CM | POA: Diagnosis not present

## 2022-03-01 DIAGNOSIS — N189 Chronic kidney disease, unspecified: Secondary | ICD-10-CM | POA: Diagnosis not present

## 2022-03-01 DIAGNOSIS — I5032 Chronic diastolic (congestive) heart failure: Secondary | ICD-10-CM | POA: Diagnosis not present

## 2022-03-01 DIAGNOSIS — I130 Hypertensive heart and chronic kidney disease with heart failure and stage 1 through stage 4 chronic kidney disease, or unspecified chronic kidney disease: Secondary | ICD-10-CM | POA: Diagnosis not present

## 2022-03-01 DIAGNOSIS — I25119 Atherosclerotic heart disease of native coronary artery with unspecified angina pectoris: Secondary | ICD-10-CM | POA: Diagnosis not present

## 2022-03-01 LAB — BASIC METABOLIC PANEL
Anion gap: 3 — ABNORMAL LOW (ref 5–15)
BUN: 16 mg/dL (ref 8–23)
CO2: 26 mmol/L (ref 22–32)
Calcium: 8.7 mg/dL — ABNORMAL LOW (ref 8.9–10.3)
Chloride: 109 mmol/L (ref 98–111)
Creatinine, Ser: 1.07 mg/dL (ref 0.61–1.24)
GFR, Estimated: >60 mL/min (ref >60)
Glucose, Bld: 146 mg/dL — ABNORMAL HIGH (ref 70–99)
Potassium: 3.8 mmol/L (ref 3.5–5.1)
Sodium: 138 mmol/L (ref 135–145)

## 2022-03-01 LAB — CBC
HCT: 42.6 % (ref 39.0–52.0)
Hemoglobin: 14 g/dL (ref 13.0–17.0)
MCH: 30.6 pg (ref 26.0–34.0)
MCHC: 32.9 g/dL (ref 30.0–36.0)
MCV: 93.2 fL (ref 80.0–100.0)
Platelets: 211 10*3/uL (ref 150–400)
RBC: 4.57 MIL/uL (ref 4.22–5.81)
RDW: 14.7 % (ref 11.5–15.5)
WBC: 4.8 10*3/uL (ref 4.0–10.5)
nRBC: 0 % (ref 0.0–0.2)

## 2022-03-01 MED ORDER — TORSEMIDE 20 MG PO TABS: 40.0000 mg | ORAL_TABLET | Freq: Every day | ORAL | Status: DC

## 2022-03-01 MED ORDER — TORSEMIDE 40 MG PO TABS: 40.0000 mg | ORAL_TABLET | Freq: Every day | ORAL | 2 refills | Status: DC

## 2022-03-01 MED ORDER — ASPIRIN 81 MG PO CHEW: 81.0000 mg | CHEWABLE_TABLET | Freq: Every day | ORAL | Status: DC

## 2022-03-01 NOTE — Progress Notes (Signed)
CARDIAC REHAB PHASE I   PRE:  Rate/Rhythm: 76 SR   BP:  Sitting: 115/65      SaO2: 93 RA   MODE:  Ambulation: 150 ft   POST:  Rate/Rhythm: 85 SR   BP:  Sitting: 138/95      SaO2: 96 RA   Pt ambulated in hall independently, tolerated well with no cp or sob. Returned to room to chair with call bell and bedside table in reach. Post stent education including heart healthy diet, restrictions, exercise guidelines, site care, antiplatelet therapy importance, risk factors, and CRP2 reviewed. All questions and concerns addressed. Will refer to [facility] for CRP2.   [id number]  Vanessa Barbara, RN BSN 03/01/2022 10:11 AM

## 2022-03-01 NOTE — Discharge Instructions (Signed)
Post Cardiac Catheterization: NO HEAVY LIFTING OR SEXUAL ACTIVITY X 7 DAYS. NO DRIVING X 3-5 DAYS. NO SOAKING BATHS, HOT TUBS, POOLS, ETC., X 7 DAYS.  Radial Site Care: Refer to this sheet in the next few weeks. These instructions provide you with information on caring for yourself after your procedure. Your caregiver may also give you more specific instructions. Your treatment has been planned according to current medical practices, but problems sometimes occur. Call your caregiver if you have any problems or questions after your procedure. HOME CARE INSTRUCTIONS  You may shower the day after the procedure.Remove the bandage (dressing) and gently wash the site with plain soap and water.Gently pat the site dry.   Do not apply powder or lotion to the site.   Do not submerge the affected site in water for 3 to 5 days.   Inspect the site at least twice daily.   Do not flex or bend the affected arm for 24 hours.   No lifting over 5 pounds (2.3 kg) for 5 days after your procedure.   Do not drive home if you are discharged the same day of the procedure. Have someone else drive you.  What to expect:  Any bruising will usually fade within 1 to 2 weeks.   Blood that collects in the tissue (hematoma) may be painful to the touch. It should usually decrease in size and tenderness within 1 to 2 weeks.  SEEK IMMEDIATE MEDICAL CARE IF:  You have unusual pain at the radial site.   You have redness, warmth, swelling, or pain at the radial site.   You have drainage (other than a small amount of blood on the dressing).   You have chills.   You have a fever or persistent symptoms for more than 72 hours.   You have a fever and your symptoms suddenly get worse.   Your arm becomes pale, cool, tingly, or numb.   You have heavy bleeding from the site. Hold pressure on the site.    

## 2022-03-01 NOTE — Progress Notes (Signed)
Notified by CCMD that Pt HR 38 and went back up and a 2.26 pause at 0322. Checked on Pt asleep and in no distress. Will continue to monitor.

## 2022-03-01 NOTE — Discharge Summary (Addendum)
Discharge Summary    Patient ID: [name] MRN: [id number]; DOB: [birth year]  Admit date: 02/28/2022 Discharge date: 03/01/2022  PCP:  [name], [place] [facility] Cardiologist:  [name], MD   Discharge Diagnoses    Principal Problem:   Dyspnea on exertion Active Problems:   CAD (coronary artery disease)   Chronic diastolic heart failure ([place])   Hyperlipidemia   Hypertension   Bradycardia    Diagnostic Studies/Procedures    Right/ Left Cardiac Catheterization 02/28/2022:   Prox LAD to Mid LAD lesion is 10% stenosed.   Ost LAD to Prox LAD lesion is 50% stenosed.   Mid LAD to Dist LAD lesion is 45% stenosed.   Dist LAD-1 lesion is 55% stenosed.   Dist LAD-2 lesion is 50% stenosed.   Prox Cx lesion is 55% stenosed.   Prox RCA lesion is 50% stenosed.   Mid RCA to Dist RCA lesion is 15% stenosed.   A stent was successfully placed.   Post intervention, there is a 0% residual stenosis.   Post intervention, there is a 0% residual stenosis.   1.  RFR of proximal left anterior descending artery that was positive; this was treated with OCT guided percutaneous coronary invention using shockwave lithotripsy, orbital atherectomy, and 1 drug-eluting stent from the ostial into the proximal left anterior sending artery. 2.  Negative RFR of proximal right coronary artery. 3.  Negative RFR of proximal left circumflex. 4.  Mean RA pressure of 12 mmHg, mean wedge pressure of 11 mmHg, mean PA pressure of 21 mmHg, LVEDP of 29 mmHg with cardiac output of 5.8 L/min and cardiac index of 2.7 L/min/m.   Recommendation: Overnight observation and continued dual antiplatelet therapy with aspirin and Plavix for at least 6 months.  Diagnostic Dominance: Right  Intervention    _____________   History of Present Illness     [name] is a 81 y.o. male with a history of CAD with a history of MI in 1996 s/p stenting at that time and then DES to LAD in  2536, chronic diastolic CHF, hypertension, hyperlipidemia, and prior GI bleed who is followed by Dr. Acie Fredrickson.  Patient has been seen multiple times over the last few weeks by Dr. Acie Fredrickson. He was seen by Dr. Acie Fredrickson on 02/17/2022 at which time he reported worsening dyspnea and chest pressure for the past several months. He was felt to have acute on chronic diastolic CHF and had bibasilar crackles on exam. His PCP had recently started Lasix but he did not have much urinary output on that so he was switched to Torsemide to $RemoveBefo'20mg'lguKXYgMeyG$  twice daily. He also admitted to eating a very salty diet every day and he was advised to cut back on his sodium intake.  He was seen back in clinic the following day and reported that his Pharmacy did not have the Torsemide which was ordered so he doubled up on his Lasix and had good urinary output with this. His weight was down 3 lbs from the day before but his breathing had not improved much. He was advised to start Torsemide as above and was then seen back in clinic on 02/22/2022 at which time his weight was down another 2 lbs but he still reported severe dyspnea with exertion. He had reduced his salt intake as instructed. He was still volume overloaded on exam with crackles in bilateral bases. Outpatient right/ left cardiac catheterization was arranged for further evaluation.  Hospital Course  Consultants: None   Patient presented to [facility] on 01/28/2022 for planned right/ left cardiac catheterization. Cath showed diffuse disease of the LAD with 50% stenosis of the ostial to proximal LAD with positive RFR just proximal to prior LAD stent which was patent with only minimal 10% in-stent restenosis. He also had non-obstructive disease of the proximal RCA and LCX with negative RFR. He underwent successful PCI with DES to ostial to proximal LAD. Also showed mean RA pressure of 12 mmHg, mean wedge pressure of 11 mmHg, mean PA pressure of 21 mmHg, LVEDP of 29 mmHg with cardiac  output of 5.8 L/min and cardiac index of 2.7 L/min/m. Recent Echo in 01/2022 showed LVEF of 50-55% with grade 1 diastolic dysfunction. Patient is doing well this morning. He denies any chest pain and feels like his shortness of breath has improved. He was able to ambulate with Cardiac Rehab without any issues. He will be discharged on DAPT with Aspirin and Plavix (was only on Plavix at home). He had recently increased Torsemide to 40mg  daily a couple of days prior to cath so will continue this dose along with KCl 40 mEq at discharge. Beta-blocker was not added due to soft heart rates at times and known bifascicular block. Otherwise continue home medications: Imdur 45mg  daily, Fenofibrate 145mg  daily, Crestor 20mg  daily, and Repatha. Recommended stopping Meloxicam and using OTC Tylenol as needed for pain given underlying heart disease and DAPT. Will need repeat BMET and Magnesium at follow-up visit next week.  Patient seen and examined today by Dr. Gardiner Rhyme and determined to be stable for discharge. Outpatient follow-up arranged. Medications as below.  Did the patient have an acute coronary syndrome (MI, NSTEMI, STEMI, etc) this admission?:  No                               Did the patient have a percutaneous coronary intervention (stent / angioplasty)?:  Yes.     Cath/PCI Registry Performance & Quality Measures: Aspirin prescribed? - Yes ADP Receptor Inhibitor (Plavix/Clopidogrel, Brilinta/Ticagrelor or Effient/Prasugrel) prescribed (includes medically managed patients)? - Yes High Intensity Statin (Lipitor 40-80mg  or Crestor 20-40mg ) prescribed? - Yes For EF <40%, was ACEI/ARB prescribed? - Not Applicable (EF >/= 95%) For EF <40%, Aldosterone Antagonist (Spironolactone or Eplerenone) prescribed? - Not Applicable (EF >/= 09%) Cardiac Rehab Phase II ordered? - Yes  _____________  Discharge Vitals Blood pressure 115/65, pulse 65, temperature 98.2 F (36.8 C), temperature source Oral, resp. rate 15,  height 5\' 9"  (1.753 m), weight 101.2 kg, SpO2 93 %.  Filed Weights   02/28/22 0600  Weight: 101.2 kg    Labs & Radiologic Studies    CBC Recent Labs    02/28/22 1426 03/01/22 0224  WBC  --  4.8  HGB 14.3 14.0  HCT 42.0 42.6  MCV  --  93.2  PLT  --  326   Basic Metabolic Panel Recent Labs    02/28/22 1426 03/01/22 0224  NA 142 138  K 4.1 3.8  CL  --  109  CO2  --  26  GLUCOSE  --  146*  BUN  --  16  CREATININE  --  1.07  CALCIUM  --  8.7*   Liver Function Tests No results for input(s): "AST", "ALT", "ALKPHOS", "BILITOT", "PROT", "ALBUMIN" in the last 72 hours. No results for input(s): "LIPASE", "AMYLASE" in the last 72 hours. High Sensitivity Troponin:   No results  for input(s): "TROPONINIHS" in the last 720 hours.  BNP Invalid input(s): "POCBNP" D-Dimer No results for input(s): "DDIMER" in the last 72 hours. Hemoglobin A1C No results for input(s): "HGBA1C" in the last 72 hours. Fasting Lipid Panel No results for input(s): "CHOL", "HDL", "LDLCALC", "TRIG", "CHOLHDL", "LDLDIRECT" in the last 72 hours. Thyroid Function Tests No results for input(s): "TSH", "T4TOTAL", "T3FREE", "THYROIDAB" in the last 72 hours.  Invalid input(s): "FREET3" _____________  CARDIAC CATHETERIZATION  Result Date: 03/01/2022   Prox LAD to Mid LAD lesion is 10% stenosed.   Ost LAD to Prox LAD lesion is 50% stenosed.   Mid LAD to Dist LAD lesion is 45% stenosed.   Dist LAD-1 lesion is 55% stenosed.   Dist LAD-2 lesion is 50% stenosed.   Prox Cx lesion is 55% stenosed.   Prox RCA lesion is 50% stenosed.   Mid RCA to Dist RCA lesion is 15% stenosed.   A stent was successfully placed.   Post intervention, there is a 0% residual stenosis.   Post intervention, there is a 0% residual stenosis. 1.  RFR of proximal left anterior descending artery that was positive; this was treated with OCT guided percutaneous coronary invention using shockwave lithotripsy, orbital atherectomy, and 1 drug-eluting  stent from the ostial into the proximal left anterior sending artery. 2.  Negative RFR of proximal right coronary artery. 3.  Negative RFR of proximal left circumflex. 4.  Mean RA pressure of 12 mmHg, mean wedge pressure of 11 mmHg, mean PA pressure of 21 mmHg, LVEDP of 29 mmHg with cardiac output of 5.8 L/min and cardiac index of 2.7 L/min/m. Recommendation: Overnight observation and continued dual antiplatelet therapy with aspirin and Plavix for at least 6 months.    Disposition   Pt is being discharged home today in good condition.  Follow-up Plans & Appointments     Follow-up Information     Elgie Collard, PA-C Follow up.   Specialty: Cardiothoracic Surgery Why: Hospital follow-up with Cardiology scheduled for 03/08/2022 at 3:10pm. Please arrive 15 minutes early for check-in. If this date/time does not work for you, please call our office to reschedule. Contact information: [address] [place] [address] [phone]                Discharge Instructions     Amb Referral to Cardiac Rehabilitation   Complete by: As directed    Diagnosis: Coronary Stents   After initial evaluation and assessments completed: Virtual Based Care may be provided alone or in conjunction with Phase 2 Cardiac Rehab based on patient barriers.: Yes   Intensive Cardiac Rehabilitation (ICR) [place] location only OR Traditional Cardiac Rehabilitation (TCR) *If criteria for ICR are not met will enroll in TCR [facility] only): Yes   Diet - low sodium heart healthy   Complete by: As directed    Increase activity slowly   Complete by: As directed        Discharge Medications   Allergies as of 03/01/2022       Reactions   Gabapentin Other (See Comments)   Patient said while taking this medication it caused nightmares and loss of sleep        Medication List     STOP taking these medications    meloxicam 7.5 MG tablet Commonly known as: MOBIC       TAKE these medications     aspirin 81 MG chewable tablet Chew 1 tablet (81 mg total) by mouth daily. Start taking  on: March 02, 2022   clopidogrel 75 MG tablet Commonly known as: PLAVIX TAKE ONE TABLET BY MOUTH ONCE DAILY   escitalopram 20 MG tablet Commonly known as: LEXAPRO Take 20 mg by mouth daily.   fenofibrate 145 MG tablet Commonly known as: TRICOR TAKE ONE TABLET BY MOUTH ONCE DAILY   isosorbide mononitrate 30 MG 24 hr tablet Commonly known as: IMDUR TAKE 1 AND 1/2 TABLETS BY MOUTH ONCE DAILY   nitroGLYCERIN 0.4 MG SL tablet Commonly known as: NITROSTAT Place 1 tablet (0.4 mg total) under the tongue every 5 (five) minutes as needed for chest pain.   pantoprazole 40 MG tablet Commonly known as: PROTONIX Take 40 mg by mouth daily.   potassium chloride SA 20 MEQ tablet Commonly known as: KLOR-CON M Take 20 mEq by mouth daily.   Repatha SureClick 742 MG/ML Soaj Generic drug: Evolocumab Inject 1 mL into the skin every 14 (fourteen) days.   rosuvastatin 20 MG tablet Commonly known as: CRESTOR Take 1 tablet (20 mg total) by mouth daily.   Torsemide 40 MG Tabs Take 40 mg by mouth daily. What changed:  medication strength how much to take           Outstanding Labs/Studies   Repeat BMET and Magnesium at follow-up visit.   Duration of Discharge Encounter   Greater than 30 minutes including physician time.  Signed, Darreld Mclean, PA-C 03/01/2022, 11:01 AM

## 2022-03-01 NOTE — Progress Notes (Addendum)
Rounding Note    Patient Name: [name] Date of Encounter: 03/01/2022  [place] Cardiologist: [name], MD   Subjective   No acute overnight events. Patient is doing well this morning. He denies any chest pain and feels like his breathing has improved although he has not been up ambulating much. He would like to go home today.  Inpatient Medications    Scheduled Meds:  aspirin  81 mg Oral Daily   clopidogrel  75 mg Oral Q breakfast   sodium chloride flush  3 mL Intravenous Q12H   Continuous Infusions:  sodium chloride     sodium chloride     PRN Meds: sodium chloride, acetaminophen, ondansetron (ZOFRAN) IV, sodium chloride flush   Vital Signs    Vitals:   02/28/22 2323 03/01/22 0012 03/01/22 0602 03/01/22 0847  BP: 133/60 109/70 129/74 115/65  Pulse: 68 71 62 65  Resp:  '15 15 15  '$ Temp:  98.1 F (36.7 C) 97.7 F (36.5 C) 98.2 F (36.8 C)  TempSrc:  Oral Oral Oral  SpO2: 95% 93% 96% 93%  Weight:      Height:        Intake/Output Summary (Last 24 hours) at 03/01/2022 0901 Last data filed at 02/28/2022 1900 Gross per 24 hour  Intake 240 ml  Output 300 ml  Net -60 ml      02/28/2022    6:00 AM 02/22/2022   11:36 AM 02/18/2022    3:25 PM  Last 3 Weights  Weight (lbs) 223 lb 224 lb 12.8 oz 226 lb  Weight (kg) 101.152 kg 101.969 kg 102.513 kg      Telemetry    Normal sinus rhythm with rates mostly in the 50s to 70s. He had a brief episode of bradycardia overnight with heart rates in the high 30s and an associated 2.26 second pauses (occurred between 3-4am). - Personally Reviewed  ECG    Normal sinus rhythm, rate 64 bpm, with know bifascicular block and slight ST depression and T wave inversions in leads V3-V6. - Personally Reviewed  Physical Exam   GEN: Obese Caucasian male resting comfortably in no acute distress.   Neck: No JVD. Cardiac: RRR. No murmurs, rubs, or gallops. Right radial cath site soft with no signs of  hematoma. Respiratory: No increased work of breath. Possible very faint crackles in bilateral bases but otherwise clear to auscultation bilaterally. GI: Soft, non-distended, and non-tender. MS: No lower extremity edema. No deformity. Skin: Warm and dry. Neuro:  No focal deficits. Psych: Normal affect. Responds appropriately.  Labs    High Sensitivity Troponin:  No results for input(s): "TROPONINIHS" in the last 720 hours.   Chemistry Recent Labs  Lab 02/22/22 1221 02/25/22 1347 02/28/22 1421 02/28/22 1422 02/28/22 1426 03/01/22 0224  NA 140 141   < > 138 142 138  K 3.9 4.5   < > 3.7 4.1 3.8  CL 94* 103  --   --   --  109  CO2 33* 29  --   --   --  26  GLUCOSE 140* 122*  --   --   --  146*  BUN 37* 21  --   --   --  16  CREATININE 2.18* 1.30*  --   --   --  1.07  CALCIUM 9.5 9.5  --   --   --  8.7*  GFRNONAA  --   --   --   --   --  >  60  ANIONGAP  --   --   --   --   --  3*   < > = values in this interval not displayed.    Lipids No results for input(s): "CHOL", "TRIG", "HDL", "LABVLDL", "LDLCALC", "CHOLHDL" in the last 168 hours.  Hematology Recent Labs  Lab 02/22/22 1221 02/28/22 1421 02/28/22 1422 02/28/22 1426 03/01/22 0224  WBC 5.9  --   --   --  4.8  RBC 5.00  --   --   --  4.57  HGB 15.2   < > 13.6 14.3 14.0  HCT 45.4   < > 40.0 42.0 42.6  MCV 91  --   --   --  93.2  MCH 30.4  --   --   --  30.6  MCHC 33.5  --   --   --  32.9  RDW 13.4  --   --   --  14.7  PLT 229  --   --   --  211   < > = values in this interval not displayed.   Thyroid No results for input(s): "TSH", "FREET4" in the last 168 hours.  BNPNo results for input(s): "BNP", "PROBNP" in the last 168 hours.  DDimer No results for input(s): "DDIMER" in the last 168 hours.   Radiology    CARDIAC CATHETERIZATION  Result Date: 03/01/2022   Prox LAD to Mid LAD lesion is 10% stenosed.   Ost LAD to Prox LAD lesion is 50% stenosed.   Mid LAD to Dist LAD lesion is 45% stenosed.   Dist LAD-1 lesion  is 55% stenosed.   Dist LAD-2 lesion is 50% stenosed.   Prox Cx lesion is 55% stenosed.   Prox RCA lesion is 50% stenosed.   Mid RCA to Dist RCA lesion is 15% stenosed.   A stent was successfully placed.   Post intervention, there is a 0% residual stenosis.   Post intervention, there is a 0% residual stenosis. 1.  RFR of proximal left anterior descending artery that was positive; this was treated with OCT guided percutaneous coronary invention using shockwave lithotripsy, orbital atherectomy, and 1 drug-eluting stent from the ostial into the proximal left anterior sending artery. 2.  Negative RFR of proximal right coronary artery. 3.  Negative RFR of proximal left circumflex. 4.  Mean RA pressure of 12 mmHg, mean wedge pressure of 11 mmHg, mean PA pressure of 21 mmHg, LVEDP of 29 mmHg with cardiac output of 5.8 L/min and cardiac index of 2.7 L/min/m. Recommendation: Overnight observation and continued dual antiplatelet therapy with aspirin and Plavix for at least 6 months.    Cardiac Studies   Right/ Left Cardiac Catheterization 02/28/2022:   Prox LAD to Mid LAD lesion is 10% stenosed.   Ost LAD to Prox LAD lesion is 50% stenosed.   Mid LAD to Dist LAD lesion is 45% stenosed.   Dist LAD-1 lesion is 55% stenosed.   Dist LAD-2 lesion is 50% stenosed.   Prox Cx lesion is 55% stenosed.   Prox RCA lesion is 50% stenosed.   Mid RCA to Dist RCA lesion is 15% stenosed.   A stent was successfully placed.   Post intervention, there is a 0% residual stenosis.   Post intervention, there is a 0% residual stenosis.   1.  RFR of proximal left anterior descending artery that was positive; this was treated with OCT guided percutaneous coronary invention using shockwave lithotripsy, orbital atherectomy, and 1 drug-eluting stent from the  ostial into the proximal left anterior sending artery. 2.  Negative RFR of proximal right coronary artery. 3.  Negative RFR of proximal left circumflex. 4.  Mean RA pressure of 12  mmHg, mean wedge pressure of 11 mmHg, mean PA pressure of 21 mmHg, LVEDP of 29 mmHg with cardiac output of 5.8 L/min and cardiac index of 2.7 L/min/m.   Recommendation: Overnight observation and continued dual antiplatelet therapy with aspirin and Plavix for at least 6 months.   Diagnostic Dominance: Right  Intervention       Patient Profile     81 y.o. male with a history of CAD with a history of MI in 1996 s/p stenting at that time and then DES to LAD in 8937, chronic diastolic CHF, hypertension, hyperlipidemia, and prior GI bleed who has been seen multiple times by Dr. Acie Fredrickson in the clinic over the last few weeks for treatment of acute on chronic diastolic CHF. He presented to [facility] on 02/28/2022 for outpatient right/left cardiac catheterization and underwent DES to LAD. He was admitted overnight for observation.  Assessment & Plan    CAD LHC showed diffuse disease of the LAD with 50% stenosis of the ostial to proximal LAD with positive RFR just proximal to prior LAD stent which was patent with only minimal 10% in-stent restenosis. He also had non-obstructive disease of the proximal RCA and LCX with negative RFR. He underwent successful PCI with DES to ostial to proximal LAD.  - He denies any chest pain and feels like his shortness of breath is better to although he has not been up ambulation much. - Continue DAPT with Aspirin and Plavix (only on Plavix at home). - Not on beta-blocker at home. Will hold off given soft heart rates at times and known bifascicular block. - Continue high-intensity statin/ Repatha.  Chronic Diastolic CHF Recently treated for acute on chronic diastolic CHF as an outpatient. Lasix was stopped and he was transitioned to Torsemide. RHC showed  Mean RA pressure of 12 mmHg, mean wedge pressure of 11 mmHg, mean PA pressure of 21 mmHg, LVEDP of 29 mmHg with cardiac output of 5.8 L/min and cardiac index of 2.7 L/min/m. - Does not appear grossly volume  overloaded. Possible faint crackles in bases. - Reports increased his home dose of torsemide to 40 mg daily 3 days ago.  We will continue on torsemide 40 mg daily and check BMET/mag in 1 week  Hypertension BP well controlled this morning. - Only on Imdur '45mg'$  daily at home. This is currently on hold but can be restarted at discharge.  Hyperlipidemia Lipid panel in 12/2021: Total Cholesterol 195, Triglycerides 119, HDL 56, LDL 118.  - Continue Crestor '40mg'$  daily. Also on Repatha which was stated after last labs in 12/2021.  - Recommend repeat lipid panel and LFTs as an outpatient.   Bradycardia Brief bradycardia overnight, suspect undiagnosed OSA.  Recommend outpatient sleep study  Disposition: Patient is doing well this morning. I think he can likely be discharged later today after being seen by MD and cardiac rehab.    For questions or updates, please contact [place] Please consult [email or web address] for contact info under        Signed, Darreld Mclean, PA-C  03/01/2022, 9:01 AM    Patient seen and examined.  Agree with above documentation.  On exam, patient is alert and oriented, regular rate and rhythm, no murmurs, faint basilar crackles on right, no LE edema.  LHC showed 50% stenosis in ostial  to proximal LAD with positive RFR status post PCI.  Continue aspirin, Plavix, statin, Repatha.  RHC pressures elevated, does not appear significantly volume overloaded on exam.  Restart p.o. torsemide, reports increased to 40 mg daily 3 days ago.  Creatinine 1.07 today, hemoglobin 14.  BP 115/65.  OK for discharge, will arrange close f/u.  Donato Heinz, MD

## 2022-03-02 LAB — LIPOPROTEIN A (LPA): Lipoprotein (a): 22.3 nmol/L (ref <75.0)

## 2022-03-04 ENCOUNTER — Encounter: Payer: Self-pay | Admitting: Podiatry

## 2022-03-04 ENCOUNTER — Ambulatory Visit (INDEPENDENT_AMBULATORY_CARE_PROVIDER_SITE_OTHER): Payer: Medicare Other | Admitting: Podiatry

## 2022-03-04 DIAGNOSIS — D689 Coagulation defect, unspecified: Secondary | ICD-10-CM

## 2022-03-04 DIAGNOSIS — M79609 Pain in unspecified limb: Secondary | ICD-10-CM

## 2022-03-04 DIAGNOSIS — B351 Tinea unguium: Secondary | ICD-10-CM

## 2022-03-04 NOTE — Progress Notes (Signed)
This patient returns to my office for at risk foot care.  This patient requires this care by a professional since this patient will be at risk due to having coagulation defect.  Patient is taking plavix.  This patient is unable to cut nails himself since the patient cannot reach his nails.These nails are painful walking and wearing shoes.  This patient presents for at risk foot care today.  General Appearance  Alert, conversant and in no acute stress.  Vascular  Dorsalis pedis and posterior tibial  pulses are palpable  bilaterally.  Capillary return is within normal limits  bilaterally. Temperature is within normal limits  bilaterally.  Neurologic  Senn-Weinstein monofilament wire test within normal limits  bilaterally. Muscle power within normal limits bilaterally.  Nails Thick disfigured discolored nails with subungual debris  from hallux to fifth toes bilaterally. No evidence of bacterial infection or drainage bilaterally.  Orthopedic  No limitations of motion  feet .  No crepitus or effusions noted.  No bony pathology or digital deformities noted.  Skin  normotropic skin with no porokeratosis noted bilaterally.  No signs of infections or ulcers noted.     Onychomycosis  Pain in right toes  Pain in left toes  Consent was obtained for treatment procedures.   Mechanical debridement of nails 1-5  bilaterally performed with a nail nipper.  Filed with dremel without incident.    Return office visit    3 months                  Told patient to return for periodic foot care and evaluation due to potential at risk complications.   Kreed Kauffman DPM  

## 2022-03-07 NOTE — Progress Notes (Signed)
Patient was a no show to his appointment.

## 2022-03-08 ENCOUNTER — Encounter: Payer: Medicare Other | Admitting: Physician Assistant

## 2022-03-08 DIAGNOSIS — E785 Hyperlipidemia, unspecified: Secondary | ICD-10-CM

## 2022-03-08 DIAGNOSIS — I5043 Acute on chronic combined systolic (congestive) and diastolic (congestive) heart failure: Secondary | ICD-10-CM

## 2022-03-08 DIAGNOSIS — I5031 Acute diastolic (congestive) heart failure: Secondary | ICD-10-CM

## 2022-03-08 DIAGNOSIS — I2510 Atherosclerotic heart disease of native coronary artery without angina pectoris: Secondary | ICD-10-CM

## 2022-03-16 ENCOUNTER — Ambulatory Visit: Payer: Medicare Other | Admitting: Cardiology

## 2022-03-21 NOTE — Progress Notes (Signed)
No show

## 2022-03-22 ENCOUNTER — Encounter: Payer: Medicare Other | Admitting: Physician Assistant

## 2022-03-22 DIAGNOSIS — E785 Hyperlipidemia, unspecified: Secondary | ICD-10-CM

## 2022-03-22 DIAGNOSIS — R079 Chest pain, unspecified: Secondary | ICD-10-CM

## 2022-03-22 DIAGNOSIS — I5032 Chronic diastolic (congestive) heart failure: Secondary | ICD-10-CM

## 2022-03-22 DIAGNOSIS — N182 Chronic kidney disease, stage 2 (mild): Secondary | ICD-10-CM

## 2022-04-15 ENCOUNTER — Ambulatory Visit (INDEPENDENT_AMBULATORY_CARE_PROVIDER_SITE_OTHER): Payer: Medicare Other

## 2022-04-15 ENCOUNTER — Ambulatory Visit (INDEPENDENT_AMBULATORY_CARE_PROVIDER_SITE_OTHER): Payer: Medicare Other | Admitting: Podiatrist

## 2022-04-15 DIAGNOSIS — I2510 Atherosclerotic heart disease of native coronary artery without angina pectoris: Secondary | ICD-10-CM | POA: Diagnosis not present

## 2022-04-15 DIAGNOSIS — M76822 Posterior tibial tendinitis, left leg: Secondary | ICD-10-CM | POA: Diagnosis not present

## 2022-04-15 DIAGNOSIS — M79672 Pain in left foot: Secondary | ICD-10-CM

## 2022-04-15 NOTE — Progress Notes (Unsigned)
Chief Complaint  Patient presents with   Foot Pain    Pain located in the left foot , pain located in the arch of foot and radiates to the toes, throbbing pain     HPI: Patient is 81 y.o. male who presents today for pain in the heel and arch area of the left foot. He denies any trauma or injury to the foot. Relates he was working on a generator about 2 weeks ago and put his foot up for a while and notices some discomfort afterwards. Relates the foot is stiff in the morning and painful to get going.    Patient Active Problem List   Diagnosis Date Noted   Chronic diastolic heart failure (HCC)    Bradycardia    Dyspnea on exertion 02/28/2022   Coagulation disorder ([place]) 03/20/2019   Acute duodenal ulcer with hemorrhage    Acute GI bleeding 06/13/2018   Gastrointestinal hemorrhage with melena    Acute blood loss anemia    Chest pain    Status post coronary artery stent placement: a. (01/18/17) PCI and DES to LAD, EF 40-45%    Acute on chronic combined systolic and diastolic CHF (congestive heart failure) (HCC)    Progressive angina (HCC)    CAD (coronary artery disease) 01/17/2017   Knee pain 09/13/2012   S/P TKR (total knee replacement) 09/13/2012   Dizziness 12/14/2011   Constipation 08/30/2011   HEADACHE 12/29/2008   Hyperlipidemia 12/25/2008   DEPRESSION 12/25/2008   Hypertension 12/25/2008   MYOCARDIAL INFARCTION, HX OF 12/25/2008   Coronary atherosclerosis 12/25/2008   ARTHRITIS 12/25/2008    Current Outpatient Medications on File Prior to Visit  Medication Sig Dispense Refill   aspirin 81 MG chewable tablet Chew 1 tablet (81 mg total) by mouth daily.     clopidogrel (PLAVIX) 75 MG tablet TAKE ONE TABLET BY MOUTH ONCE DAILY 90 tablet 2   escitalopram (LEXAPRO) 20 MG tablet Take 20 mg by mouth daily.     fenofibrate (TRICOR) 145 MG tablet TAKE ONE TABLET BY MOUTH ONCE DAILY 90 tablet 2   isosorbide mononitrate (IMDUR) 30 MG 24 hr tablet TAKE 1 AND 1/2 TABLETS BY MOUTH  ONCE DAILY 135 tablet 1   nitroGLYCERIN (NITROSTAT) 0.4 MG SL tablet Place 1 tablet (0.4 mg total) under the tongue every 5 (five) minutes as needed for chest pain. 25 tablet 3   pantoprazole (PROTONIX) 40 MG tablet Take 40 mg by mouth daily.     potassium chloride SA (KLOR-CON M) 20 MEQ tablet Take 20 mEq by mouth daily.     REPATHA SURECLICK 338 MG/ML SOAJ Inject 1 mL into the skin every 14 (fourteen) days.     rosuvastatin (CRESTOR) 20 MG tablet Take 1 tablet (20 mg total) by mouth daily. 90 tablet 3   torsemide 40 MG TABS Take 40 mg by mouth daily. 30 tablet 2   No current facility-administered medications on file prior to visit.    Allergies  Allergen Reactions   Gabapentin Other (See Comments)    Patient said while taking this medication it caused nightmares and loss of sleep    Review of Systems No fevers, chills, nausea, muscle aches, no difficulty breathing, no calf pain, no chest pain or shortness of breath.   Physical Exam  GENERAL APPEARANCE: Alert, conversant. Appropriately groomed. No acute distress.   VASCULAR: Pedal pulses palpable 2/4 DP and 2/4 PT bilateral.  Capillary refill time is immediate to all digits,  Proximal to distal cooling  is warm to warm.  Digital perfusion adequate.   NEUROLOGIC: sensation is intact to 5.07 monofilament at 5/5 sites bilateral.  Light touch is intact bilateral, vibratory sensation intact bilateral.  Negative tinels sign when tapping along the tarsal canal left.    MUSCULOSKELETAL: acceptable muscle strength, tone and stability bilateral.  No gross boney pedal deformities noted.  Pain on palpation insertion of the posterior tibial tendon on the medial foot noted. No pain along the plantar fascia at its insertion or in the forefoot.    DERMATOLOGIC: skin is warm, supple, and dry.  Color, texture, and turgor of skin within normal limits.  No open wounds are noted.  No preulcerative lesions are seen.  Digital nails are asymptomatic.     Xrays- Radiographic exam of the left foot:  Normal osseous mineralization.  Accessory navicular present.  Mild bunion deformity seen.  No fracture or dislocation or acute osseous abnormalities present.  Joint spaces are normal.    Assessment     ICD-10-CM   1. Posterior tibial tendinitis of left lower extremity  M76.822 DG Foot Complete Left       Plan  Exam findings and treatment recommendations are discussed. Xrays were also shown.  Pain on palpation at the posterior tibial tendon and therefore recommended an injection to try to calm this area down. He agreed and an injection was carried out. Recommended ankle compression for the next 2 weeks to support the tendon.  Also recommended an ankle brace should the tendon pain continue.  He will be seen back for recheck in 4-6 weeks if his pain is not relieved by the injection.    Procedure: Injection left foot Discussed alternatives, risks, complications and verbal consent was obtained.  Location: posterior tibial tendon- near insertion- superficial to tendon itself.  Skin Prep: Alcohol. Injectate: 1cc 0.5% marcaine plain, 1 cc 10 mg Kenalog Disposition: Patient tolerated procedure well. Injection site dressed with a band-aid.  Post-injection care was discussed and return precautions discussed.

## 2022-04-25 ENCOUNTER — Telehealth (HOSPITAL_COMMUNITY): Payer: Self-pay

## 2022-04-25 DIAGNOSIS — M79672 Pain in left foot: Secondary | ICD-10-CM | POA: Diagnosis not present

## 2022-04-25 NOTE — Telephone Encounter (Signed)
Called and spoke with pt in regards to Cardiac rehab, pt stated he is not interested at this time.   Closed referral

## 2022-05-04 ENCOUNTER — Telehealth: Payer: Self-pay | Admitting: Cardiovascular Disease

## 2022-05-04 NOTE — Telephone Encounter (Signed)
New Message:     Naida Sleight from [facility] is calling , concening  patient's Torsemide.

## 2022-05-04 NOTE — Telephone Encounter (Signed)
PharmD, Naida Sleight, called from PCP office. Reports concern that pt is not taking Torsemide as ordered or taking it at all. They are concerned about medication compliancy. [facility] and gain better compliancy, but pt has not picked up Torsemide Rx in awhile now. She also reports that pt has not been seen since hospital discharge. They are very concerned pt is going to end up back in hospital.  Aware am forwarding to Dr. Elmarie Shiley nurse to address getting pt in the office to discuss medications/treatment plan. (Pt no showed to both post hospital follow up appointments) Informed pharmD that we would follow up with pt on this matter.

## 2022-05-05 ENCOUNTER — Other Ambulatory Visit: Payer: Self-pay | Admitting: Podiatrist

## 2022-05-05 DIAGNOSIS — M76822 Posterior tibial tendinitis, left leg: Secondary | ICD-10-CM

## 2022-05-05 DIAGNOSIS — M79672 Pain in left foot: Secondary | ICD-10-CM

## 2022-05-06 NOTE — Telephone Encounter (Signed)
Left detailed message to call office back to schedule appt

## 2022-05-11 NOTE — Telephone Encounter (Signed)
Attempted patient again at both numbers on file. Left message at both as well, asking patient to call office back to review one of his medications and potentially set up an office visit. Awaiting call back.

## 2022-05-13 NOTE — Telephone Encounter (Signed)
Third attempt to reach patient was successful. He states that he had stopped his Torsemide "for a while because it was making me go to the bathroom just all the time." He states that he will begin taking again now and try to remain on it consistently. He denies any edema or shortness of breath. States that he doesn't weigh each day, but does every few days, and says that he has lost 2-3 lbs since leaving the hospital. Advised him to keep a closer eye on this, especially if not going to take the diuretic daily. Due for return visit in April, got pt scheduled for 09/15/22 @ 10:40.

## 2022-05-20 DIAGNOSIS — H31093 Other chorioretinal scars, bilateral: Secondary | ICD-10-CM | POA: Diagnosis not present

## 2022-05-20 DIAGNOSIS — H31011 Macula scars of posterior pole (postinflammatory) (post-traumatic), right eye: Secondary | ICD-10-CM | POA: Diagnosis not present

## 2022-05-20 DIAGNOSIS — H5500 Unspecified nystagmus: Secondary | ICD-10-CM | POA: Diagnosis not present

## 2022-05-20 DIAGNOSIS — H04411 Chronic dacryocystitis of right lacrimal passage: Secondary | ICD-10-CM | POA: Diagnosis not present

## 2022-06-02 DIAGNOSIS — Z23 Encounter for immunization: Secondary | ICD-10-CM | POA: Diagnosis not present

## 2022-06-03 ENCOUNTER — Ambulatory Visit (INDEPENDENT_AMBULATORY_CARE_PROVIDER_SITE_OTHER): Payer: Medicare Other | Admitting: Podiatry

## 2022-06-03 ENCOUNTER — Encounter: Payer: Self-pay | Admitting: Podiatry

## 2022-06-03 DIAGNOSIS — D689 Coagulation defect, unspecified: Secondary | ICD-10-CM | POA: Diagnosis not present

## 2022-06-03 DIAGNOSIS — B351 Tinea unguium: Secondary | ICD-10-CM

## 2022-06-03 DIAGNOSIS — M79609 Pain in unspecified limb: Secondary | ICD-10-CM | POA: Diagnosis not present

## 2022-06-03 NOTE — Progress Notes (Signed)
This patient returns to my office for at risk foot care.  This patient requires this care by a professional since this patient will be at risk due to having coagulation defect.  Patient is taking plavix.  This patient is unable to cut nails himself since the patient cannot reach his nails.These nails are painful walking and wearing shoes.  This patient presents for at risk foot care today.  General Appearance  Alert, conversant and in no acute stress.  Vascular  Dorsalis pedis and posterior tibial  pulses are palpable  bilaterally.  Capillary return is within normal limits  bilaterally. Temperature is within normal limits  bilaterally.  Neurologic  Senn-Weinstein monofilament wire test within normal limits  bilaterally. Muscle power within normal limits bilaterally.  Nails Thick disfigured discolored nails with subungual debris  from hallux to fifth toes bilaterally. No evidence of bacterial infection or drainage bilaterally.  Orthopedic  No limitations of motion  feet .  No crepitus or effusions noted.  No bony pathology or digital deformities noted.  Skin  normotropic skin with no porokeratosis noted bilaterally.  No signs of infections or ulcers noted.     Onychomycosis  Pain in right toes  Pain in left toes  Consent was obtained for treatment procedures.   Mechanical debridement of nails 1-5  bilaterally performed with a nail nipper.  Filed with dremel without incident.    Return office visit    3 months                  Told patient to return for periodic foot care and evaluation due to potential at risk complications.   Gardiner Barefoot DPM

## 2022-06-09 DIAGNOSIS — I130 Hypertensive heart and chronic kidney disease with heart failure and stage 1 through stage 4 chronic kidney disease, or unspecified chronic kidney disease: Secondary | ICD-10-CM | POA: Diagnosis not present

## 2022-06-09 DIAGNOSIS — E1122 Type 2 diabetes mellitus with diabetic chronic kidney disease: Secondary | ICD-10-CM | POA: Diagnosis not present

## 2022-06-09 DIAGNOSIS — I209 Angina pectoris, unspecified: Secondary | ICD-10-CM | POA: Diagnosis not present

## 2022-06-09 DIAGNOSIS — I25118 Atherosclerotic heart disease of native coronary artery with other forms of angina pectoris: Secondary | ICD-10-CM | POA: Diagnosis not present

## 2022-06-09 DIAGNOSIS — I5022 Chronic systolic (congestive) heart failure: Secondary | ICD-10-CM | POA: Diagnosis not present

## 2022-06-09 DIAGNOSIS — R5383 Other fatigue: Secondary | ICD-10-CM | POA: Diagnosis not present

## 2022-06-16 DIAGNOSIS — N1831 Chronic kidney disease, stage 3a: Secondary | ICD-10-CM | POA: Diagnosis not present

## 2022-06-16 DIAGNOSIS — I25118 Atherosclerotic heart disease of native coronary artery with other forms of angina pectoris: Secondary | ICD-10-CM | POA: Diagnosis not present

## 2022-06-16 DIAGNOSIS — E782 Mixed hyperlipidemia: Secondary | ICD-10-CM | POA: Diagnosis not present

## 2022-06-16 DIAGNOSIS — I130 Hypertensive heart and chronic kidney disease with heart failure and stage 1 through stage 4 chronic kidney disease, or unspecified chronic kidney disease: Secondary | ICD-10-CM | POA: Diagnosis not present

## 2022-06-16 DIAGNOSIS — E1122 Type 2 diabetes mellitus with diabetic chronic kidney disease: Secondary | ICD-10-CM | POA: Diagnosis not present

## 2022-06-16 DIAGNOSIS — I5022 Chronic systolic (congestive) heart failure: Secondary | ICD-10-CM | POA: Diagnosis not present

## 2022-06-16 DIAGNOSIS — Z0000 Encounter for general adult medical examination without abnormal findings: Secondary | ICD-10-CM | POA: Diagnosis not present

## 2022-06-29 ENCOUNTER — Other Ambulatory Visit: Payer: Self-pay | Admitting: Cardiovascular Disease

## 2022-07-22 DIAGNOSIS — L853 Xerosis cutis: Secondary | ICD-10-CM | POA: Diagnosis not present

## 2022-07-22 DIAGNOSIS — L218 Other seborrheic dermatitis: Secondary | ICD-10-CM | POA: Diagnosis not present

## 2022-08-01 ENCOUNTER — Telehealth: Payer: Self-pay | Admitting: Cardiovascular Disease

## 2022-08-01 NOTE — Telephone Encounter (Signed)
Returned call to patient.  Patient states for the last 1-2 weeks he has been feeling fatigued/sore with weakness in his legs. He denies performing any strenuous activities prior to onset that could result in these symptoms. Patient states he feels like he doesn't want to do anything and reports decreased strength in legs.  Patient asked if this could be from [place].  Will forward to Pharm D to review.   Advised patient contact PCP to discuss symptoms.

## 2022-08-01 NOTE — Telephone Encounter (Signed)
Pt states he has been very fatigue, unable to do anything. He wonder if this is related to him taking Repatha or not. Pt wants to know if he should f/u with Dr. Marlou Porch or PCP about this. Please advise.

## 2022-08-01 NOTE — Telephone Encounter (Signed)
I'm not sure how long he's been taking Repatha, it looks like his PCP has been prescribing it. Repatha can cause muscle weakness/fatigue but it isn't a very common side effect. Would be helpful to know how long he's been taking Repatha vs when his symptoms first started. Could consider skipping a dose of the Repatha to see if symptoms improve off the med, but agree follow up should be with PCP since they're managing med for him.

## 2022-08-01 NOTE — Telephone Encounter (Signed)
Spoke with the patient and gave advisement from PharmD. He started Repatha a couple of weeks ago and states he cannot correlate timing of symptoms. He will reach out to his PCP.

## 2022-08-15 DIAGNOSIS — M79672 Pain in left foot: Secondary | ICD-10-CM | POA: Diagnosis not present

## 2022-08-19 ENCOUNTER — Encounter: Payer: Self-pay | Admitting: Podiatry

## 2022-08-19 ENCOUNTER — Ambulatory Visit (INDEPENDENT_AMBULATORY_CARE_PROVIDER_SITE_OTHER): Payer: Medicare Other | Admitting: Podiatry

## 2022-08-19 DIAGNOSIS — M76822 Posterior tibial tendinitis, left leg: Secondary | ICD-10-CM | POA: Diagnosis not present

## 2022-08-19 MED ORDER — TRIAMCINOLONE ACETONIDE 10 MG/ML IJ SUSP
10.0000 mg | Freq: Once | INTRAMUSCULAR | Status: AC
  Administered 2022-08-19: 10 mg

## 2022-08-19 NOTE — Progress Notes (Signed)
Subjective:   Patient ID: [name], male   DOB: 82 y.o.   MRN: [id number]   HPI Patient states doing really well but is started develop pain again in the left ankle stating that he probably wear shoe that was too flat   ROS      Objective:  Physical Exam  Neurovascular status intact inflammation posterior tibial tendon left with no indication of arch height loss     Assessment:  Posterior tibial tendinitis left     Plan:  Reviewed 1 more injection and the usage of supportive shoes and did sterile prep injected the posterior tibial tendon sheath 3 mg Dexasone Kenalog 5 mg Xylocaine advised on supportive shoes reappoint to recheck

## 2022-08-22 DIAGNOSIS — D229 Melanocytic nevi, unspecified: Secondary | ICD-10-CM | POA: Diagnosis not present

## 2022-08-22 DIAGNOSIS — R269 Unspecified abnormalities of gait and mobility: Secondary | ICD-10-CM | POA: Diagnosis not present

## 2022-08-22 DIAGNOSIS — R270 Ataxia, unspecified: Secondary | ICD-10-CM | POA: Diagnosis not present

## 2022-08-24 ENCOUNTER — Other Ambulatory Visit: Payer: Self-pay | Admitting: Internal Medicine

## 2022-08-24 DIAGNOSIS — R270 Ataxia, unspecified: Secondary | ICD-10-CM

## 2022-08-24 DIAGNOSIS — R269 Unspecified abnormalities of gait and mobility: Secondary | ICD-10-CM

## 2022-08-29 ENCOUNTER — Other Ambulatory Visit: Payer: Self-pay | Admitting: Cardiovascular Disease

## 2022-09-09 ENCOUNTER — Ambulatory Visit: Payer: Medicare Other | Admitting: Podiatry

## 2022-09-14 ENCOUNTER — Encounter: Payer: Self-pay | Admitting: Cardiovascular Disease

## 2022-09-14 NOTE — Progress Notes (Unsigned)
Cardiology Office Note:    Date:  09/15/2022   ID:  [name], DOB 03-08-1941, MRN [id number]  PCP:  Georgianne Fick, MD   [facility] Providers Cardiologist:  Kristeen Miss, MD {    Referring MD: Georgianne Fick, MD   Chief Complaint  Patient presents with   Congestive Heart Failure        Coronary Artery Disease          History of Present Illness:    [name] is a 82 y.o. male with a hx of coronary artery disease, status post stent placement, congestive heart failure  He presents today for further evaluation and management of increasing shortness of breath.  Echo [facility] - images not available ) report indicates he has normal LV systolic function,  grade I diastolic dysfunction.    Sept. 14, 2023  Here with worsening dyspnea  Chest pressure  With waking , feels presyncopal  Feels unsteady on his feet.   Had a stent placed several years ago Symptoms might be similar ,  Is now on Repatha   The progressive DOE / chest pressure has been present for several months   Eats sausage and bologna several times a week .   Medical doctor started Lasix 40 mg a day  Does not urinate much with that. Wt is 229  Sept. 15, 2023 [name] is seen for follow up of his dyspnea and chest tightness He was seen yesterday with apparent volume overload + rales bilaterally  Eats a very salty diet every day - Ive asked him to cut out his hot dogs and bologna   We started him on torsemide  ( instead of furosemide ) when he went to his pharmacy they stated that they did not have the torsemide ordered.  We have verified that the [facility].  Since he was not able to take the torsemide he doubled up on his Lasix.  He put out quite a bit more urine.  His weight today is 226 pounds which is down 3 pounds from yesterday.  He states that he is not really breathing all that much better.   Sept. 19, 2023  [name] is seen  today for follow up of his CHF  Has reduced his salt intake. Wt is 224 lbs today ( down 2 lbs)  Eating salad, lots of fruit ( will need to check BMP / potassium today )  He still is not breathing well.  He still gets these episodes of hot sweats.  He still has severe shortness of breath with any activity.  We will schedule him for right and left heart catheterization.  We have discussed the risk, benefits, options of heart catheterization.  He understands and agrees to proceed.   September 05, 2022 [name] is seen for follow up of his CHF  He had shock wave lithotripsy and stenting of his prox LAD on Sept. 2023  Has some leg aches ,   He is on both Repatha and rosuvastatin.  Will have him hold his rosuvastatin for 1 month to 2 months to see if his leg aches improved.  If they do improve then we will continue with Repatha alone.  If they do not improve with holding the rosuvastatin we may consider restarting it.  Has left basilar rales,  has run out of his Torsemide   Past Medical History:  Diagnosis Date   Adenomatous colon polyp    Arthritis  CAD (coronary artery disease)    a. MI in 1996 with stent placement b. cath 01/18/17 - S/p PCI of in-stent restenosis of pLAD with cutting ballon & DES; 20% ostial LAD; 40% pro Cx; 60% focal pRCA   Clostridium difficile infection    Depression    Diverticulosis    H. pylori infection    HTN (hypertension)    Hyperlipidemia    MI (myocardial infarction)     Past Surgical History:  Procedure Laterality Date   BRAIN TUMOR EXCISION  1954   benign tumor in back [facility]   CARDIAC CATHETERIZATION     COLONOSCOPY     hx of polyps   CORONARY ANGIOPLASTY WITH STENT PLACEMENT  1996; 1997; 01/18/2017   CORONARY ATHERECTOMY N/A 02/28/2022   Procedure: CORONARY ATHERECTOMY;  Surgeon: Orbie Pyohukkani, Arun K, MD;  Location: [facility];  Service: Cardiovascular;  Laterality: N/A;   CORONARY IMAGING/OCT N/A 02/28/2022   Procedure:  INTRAVASCULAR IMAGING/OCT;  Surgeon: Orbie Pyohukkani, Arun K, MD;  Location: [facility];  Service: Cardiovascular;  Laterality: N/A;   CORONARY LITHOTRIPSY N/A 02/28/2022   Procedure: CORONARY LITHOTRIPSY;  Surgeon: Orbie Pyohukkani, Arun K, MD;  Location: [facility];  Service: Cardiovascular;  Laterality: N/A;   CORONARY PRESSURE/FFR STUDY N/A 07/28/2020   Procedure: INTRAVASCULAR PRESSURE WIRE/FFR STUDY;  Surgeon: Marykay LexHarding, David W, MD;  [facility];  Service: Cardiovascular;  Laterality: N/A;   CORONARY PRESSURE/FFR STUDY N/A 02/28/2022   Procedure: INTRAVASCULAR PRESSURE WIRE/FFR STUDY;  Surgeon: Orbie Pyohukkani, Arun K, MD;  [facility]: [facility];  Service: Cardiovascular;  Laterality: N/A;   CORONARY STENT INTERVENTION N/A 01/18/2017   Procedure: CORONARY STENT INTERVENTION;  Surgeon: Lennette BihariKelly, Thomas A, MD;  [facility]: [facility];  Service: Cardiovascular;  Laterality: N/A;   CORONARY STENT INTERVENTION N/A 02/28/2022   Procedure: CORONARY STENT INTERVENTION;  Surgeon: Orbie Pyohukkani, Arun K, MD;  [facility];  Service: Cardiovascular;  Laterality: N/A;   ESOPHAGOGASTRODUODENOSCOPY (EGD) WITH PROPOFOL N/A 06/14/2018   Procedure: ESOPHAGOGASTRODUODENOSCOPY (EGD) WITH PROPOFOL;  Surgeon: Iva BoopGessner, Carl E, MD;  Location: [facility];  Service: Endoscopy;  Laterality: N/A;   JOINT REPLACEMENT     LEFT HEART CATH AND CORONARY ANGIOGRAPHY N/A 01/18/2017   Procedure: LEFT HEART CATH AND CORONARY ANGIOGRAPHY;  Surgeon: Lennette BihariKelly, Thomas A, MD;  [facility];  Service: Cardiovascular;  Laterality: N/A;   LEFT HEART CATH AND CORONARY ANGIOGRAPHY N/A 07/28/2020   Procedure: LEFT HEART CATH AND CORONARY ANGIOGRAPHY;  Surgeon: Marykay LexHarding, David W, MD;  [facility];  Service: Cardiovascular;  Laterality: N/A;   RIGHT/LEFT HEART CATH AND CORONARY ANGIOGRAPHY N/A 02/28/2022   Procedure: RIGHT/LEFT HEART CATH AND CORONARY ANGIOGRAPHY;  Surgeon: Orbie Pyohukkani, Arun  K, MD;  Location: [facility];  Service: Cardiovascular;  Laterality: N/A;   stent x2 in the LAD  with intra-aortic balloon pump support  1996   TONSILLECTOMY     TOTAL KNEE ARTHROPLASTY Bilateral     Current Medications: Current Meds  Medication Sig   acetaminophen (TYLENOL) 325 MG tablet Take 325 mg by mouth 3 (three) times daily as needed.   Aspirin 81 MG CAPS Take 1 capsule every day by oral route.   clopidogrel (PLAVIX) 75 MG tablet TAKE ONE TABLET BY MOUTH ONCE DAILY   escitalopram (LEXAPRO) 20 MG tablet Take 20 mg by mouth daily.   isosorbide mononitrate (IMDUR) 30 MG 24 hr tablet TAKE  1 AND 1/2 TABLETS BY MOUTH ONCE DAILY   nitroGLYCERIN (NITROSTAT) 0.4 MG SL tablet Place 1 tablet (0.4 mg total) under the tongue every 5 (five) minutes as needed for chest pain.   pantoprazole (PROTONIX) 40 MG tablet Take 40 mg by mouth daily.   potassium chloride SA (KLOR-CON M) 20 MEQ tablet Take 20 mEq by mouth daily.   REPATHA SURECLICK 140 MG/ML SOAJ Inject 1 mL into the skin every 14 (fourteen) days.   rosuvastatin (CRESTOR) 20 MG tablet TAKE ONE TABLET BY MOUTH ONCE DAILY     Allergies:   Gabapentin   Social History   Socioeconomic History   Marital status: Widowed    Spouse name: Not on file   Number of children: 1   Years of education: Not on file   Highest education level: Not on file  Occupational History   Occupation: Retired  Tobacco Use   Smoking status: Former    Types: Cigarettes    Quit date: 06/06/1994    Years since quitting: 28.2   Smokeless tobacco: Never  Vaping Use   Vaping Use: Never used  Substance and Sexual Activity   Alcohol use: No   Drug use: No   Sexual activity: Yes  Other Topics Concern   Not on file  Social History Narrative   Not on file   Social Determinants of Health   Financial Resource Strain: Not on file  Food Insecurity: No Food Insecurity (02/28/2022)   Hunger Vital Sign    Worried About Running Out of Food in the Last Year:  Never true    Ran Out of Food in the Last Year: Never true  Transportation Needs: No Transportation Needs (02/28/2022)   PRAPARE - Administrator, Civil Service (Medical): No    Lack of Transportation (Non-Medical): No  Physical Activity: Not on file  Stress: Not on file  Social Connections: Not on file     Family History: The patient's family history includes Cancer in his sister; Heart attack in his mother; Heart attack (age of onset: 16) in his brother. There is no history of Colon cancer, Stomach cancer, Rectal cancer, or Esophageal cancer.  ROS:   Please see the history of present illness.     All other systems reviewed and are negative.  EKGs/Labs/Other Studies Reviewed:    The following studies were reviewed today:   EKG:      Recent Labs: 12/21/2021: ALT 19 03/01/2022: BUN 16; Creatinine, Ser 1.07; Hemoglobin 14.0; Platelets 211; Potassium 3.8; Sodium 138  Recent Lipid Panel    Component Value Date/Time   CHOL 195 12/21/2021 1059   TRIG 119 12/21/2021 1059   HDL 56 12/21/2021 1059   CHOLHDL 3.5 12/21/2021 1059   CHOLHDL 4 06/23/2009 1147   VLDL 23.4 06/23/2009 1147   LDLCALC 118 (H) 12/21/2021 1059   LDLDIRECT 159.1 12/10/2007 0851     Risk Assessment/Calculations:               Physical Exam:    Physical Exam: Blood pressure 130/82, pulse 61, height 5\' 9"  (1.753 m), weight 223 lb (101.2 kg), SpO2 97 %.       GEN:  Well nourished, well developed in no acute distress HEENT: Normal NECK: No JVD; No carotid bruits LYMPHATICS: No lymphadenopathy CARDIAC: RRR   RESPIRATORY:  rales in left base  ABDOMEN: Soft, non-tender, non-distended MUSCULOSKELETAL:  No edema; No deformity  SKIN: Warm and dry NEUROLOGIC:  Alert and oriented x 3  ASSESSMENT:    1. Chronic combined systolic and diastolic congestive heart failure   2. Hyperlipidemia LDL goal <70       PLAN:       Acute on chronic diastolic congestive heart failure:  . Has some basilar rales in his left base.  He informed that he has run out of his torsemide several weeks ago.  Will refill his torsemide.  Continue potassium.  2.  Mild chronic kidney disease:         Medication Adjustments/Labs and Tests Ordered: Current medicines are reviewed at length with the patient today.  Concerns regarding medicines are outlined above.  No orders of the defined types were placed in this encounter.  Meds ordered this encounter  Medications   Torsemide 40 MG TABS    Sig: Take 40 mg by mouth daily.    Dispense:  30 tablet    Refill:  2     Patient Instructions  Medication Instructions:  Your physician recommends that you continue on your current medications as directed. Please refer to the Current Medication list given to you today. *If you need a refill on your cardiac medications before your next appointment, please call your pharmacy*   Follow-Up: At [facility], you and your health needs are our priority.  As part of our continuing mission to provide you with exceptional heart care, we have created designated Provider Care Teams.  These Care Teams include your primary Cardiologist (physician) and Advanced Practice Providers (APPs -  Physician Assistants and Nurse Practitioners) who all work together to provide you with the care you need, when you need it.  We recommend signing up for the patient portal called "MyChart".  Sign up information is provided on this After Visit Summary.  MyChart is used to connect with patients for Virtual Visits (Telemedicine).  Patients are able to view lab/test results, encounter notes, upcoming appointments, etc.  Non-urgent messages can be sent to your provider as well.   To learn more about what you can do with MyChart, go to [email or web address].    Your next appointment:   6 month(s)  Provider:   Kristeen Miss, MD     Signed, Kristeen Miss, MD  09/15/2022 1:06 PM    [facility]

## 2022-09-15 ENCOUNTER — Encounter: Payer: Self-pay | Admitting: Cardiovascular Disease

## 2022-09-15 ENCOUNTER — Ambulatory Visit: Payer: Medicare Other | Attending: Cardiovascular Disease | Admitting: Cardiovascular Disease

## 2022-09-15 VITALS — BP 130/82 | HR 61 | Ht 69.0 in | Wt 223.0 lb

## 2022-09-15 DIAGNOSIS — I5042 Chronic combined systolic (congestive) and diastolic (congestive) heart failure: Secondary | ICD-10-CM | POA: Insufficient documentation

## 2022-09-15 DIAGNOSIS — E785 Hyperlipidemia, unspecified: Secondary | ICD-10-CM | POA: Diagnosis not present

## 2022-09-15 MED ORDER — TORSEMIDE 40 MG PO TABS: 40.0000 mg | ORAL_TABLET | Freq: Every day | ORAL | 2 refills | Status: DC

## 2022-09-15 NOTE — Patient Instructions (Signed)
Medication Instructions:  Your physician recommends that you continue on your current medications as directed. Please refer to the Current Medication list given to you today. *If you need a refill on your cardiac medications before your next appointment, please call your pharmacy*   Follow-Up: At [facility], you and your health needs are our priority.  As part of our continuing mission to provide you with exceptional heart care, we have created designated Provider Care Teams.  These Care Teams include your primary Cardiologist (physician) and Advanced Practice Providers (APPs -  Physician Assistants and Nurse Practitioners) who all work together to provide you with the care you need, when you need it.  We recommend signing up for the patient portal called "MyChart".  Sign up information is provided on this After Visit Summary.  MyChart is used to connect with patients for Virtual Visits (Telemedicine).  Patients are able to view lab/test results, encounter notes, upcoming appointments, etc.  Non-urgent messages can be sent to your provider as well.   To learn more about what you can do with MyChart, go to [email or web address].    Your next appointment:   6 month(s)  Provider:   Philip Nahser, MD   

## 2022-09-17 ENCOUNTER — Ambulatory Visit
Admission: RE | Admit: 2022-09-17 | Discharge: 2022-09-17 | Disposition: A | Discharge status: Home / Self Care | Payer: Medicare Other | Source: Ambulatory Visit | Attending: Internal Medicine | Admitting: Internal Medicine

## 2022-09-17 DIAGNOSIS — G9389 Other specified disorders of brain: Secondary | ICD-10-CM | POA: Diagnosis not present

## 2022-09-17 DIAGNOSIS — I614 Nontraumatic intracerebral hemorrhage in cerebellum: Secondary | ICD-10-CM | POA: Diagnosis not present

## 2022-09-17 DIAGNOSIS — I6782 Cerebral ischemia: Secondary | ICD-10-CM | POA: Diagnosis not present

## 2022-09-17 DIAGNOSIS — H9313 Tinnitus, bilateral: Secondary | ICD-10-CM | POA: Diagnosis not present

## 2022-09-17 DIAGNOSIS — R270 Ataxia, unspecified: Secondary | ICD-10-CM

## 2022-09-17 DIAGNOSIS — R269 Unspecified abnormalities of gait and mobility: Secondary | ICD-10-CM

## 2022-09-27 ENCOUNTER — Ambulatory Visit (INDEPENDENT_AMBULATORY_CARE_PROVIDER_SITE_OTHER): Payer: Medicare Other | Admitting: Podiatry

## 2022-09-27 ENCOUNTER — Encounter: Payer: Self-pay | Admitting: Podiatry

## 2022-09-27 DIAGNOSIS — M79609 Pain in unspecified limb: Secondary | ICD-10-CM

## 2022-09-27 DIAGNOSIS — B351 Tinea unguium: Secondary | ICD-10-CM

## 2022-09-27 NOTE — Progress Notes (Signed)
This patient returns to my office for at risk foot care.  This patient requires this care by a professional since this patient will be at risk due to having coagulation defect.  Patient is taking plavix.  This patient is unable to cut nails himself since the patient cannot reach his nails.These nails are painful walking and wearing shoes.  This patient presents for at risk foot care today.  General Appearance  Alert, conversant and in no acute stress.  Vascular  Dorsalis pedis and posterior tibial  pulses are palpable  bilaterally.  Capillary return is within normal limits  bilaterally. Temperature is within normal limits  bilaterally.  Neurologic  Senn-Weinstein monofilament wire test within normal limits  bilaterally. Muscle power within normal limits bilaterally.  Nails Thick disfigured discolored nails with subungual debris  from hallux to fifth toes bilaterally. No evidence of bacterial infection or drainage bilaterally.  Orthopedic  No limitations of motion  feet .  No crepitus or effusions noted.  No bony pathology or digital deformities noted.  Skin  normotropic skin with no porokeratosis noted bilaterally.  No signs of infections or ulcers noted.     Onychomycosis  Pain in right toes  Pain in left toes  Consent was obtained for treatment procedures.   Mechanical debridement of nails 1-5  bilaterally performed with a nail nipper.  Filed with dremel without incident.    Return office visit    4  months                  Told patient to return for periodic foot care and evaluation due to potential at risk complications.   Helane Gunther DPM

## 2022-10-04 DIAGNOSIS — M47896 Other spondylosis, lumbar region: Secondary | ICD-10-CM | POA: Diagnosis not present

## 2022-10-05 ENCOUNTER — Other Ambulatory Visit: Payer: Self-pay

## 2022-10-07 ENCOUNTER — Ambulatory Visit (INDEPENDENT_AMBULATORY_CARE_PROVIDER_SITE_OTHER): Payer: Medicare Other

## 2022-10-07 ENCOUNTER — Encounter: Payer: Self-pay | Admitting: Podiatry

## 2022-10-07 ENCOUNTER — Ambulatory Visit (INDEPENDENT_AMBULATORY_CARE_PROVIDER_SITE_OTHER): Payer: Medicare Other | Admitting: Podiatry

## 2022-10-07 DIAGNOSIS — M76822 Posterior tibial tendinitis, left leg: Secondary | ICD-10-CM

## 2022-10-07 MED ORDER — TRIAMCINOLONE ACETONIDE 10 MG/ML IJ SUSP
10.0000 mg | Freq: Once | INTRAMUSCULAR | Status: AC
  Administered 2022-10-07: 10 mg

## 2022-10-10 NOTE — Progress Notes (Signed)
Subjective:   Patient ID: [name], male   DOB: 82 y.o.   MRN: [id number]   HPI Patient presents with a lot of pain in the left medial ankle.  States has not been improving hard for him to be active and to be able to walk   ROS      Objective:  Physical Exam  Neurovascular status intact quite a bit of inflammation pain posterior tibial tendon as it comes under the medial malleolus that has not responded to conservative treatment.  No indication of tendon rupture currently     Assessment:  Chronic posterior tibial tendinitis left foot structural issues with pain that has not gotten better with previous conservative treatment     Plan:  H&P reviewed at this point I did go ahead I did a sheath injection in order to reduce the inflammation explaining risk first then went ahead and applied air fracture walker to completely immobilize take all stress off the foot.  Patient was fitted properly and it did feel comfortable will be seen back 4 weeks or earlier if needed

## 2022-11-04 ENCOUNTER — Encounter: Payer: Self-pay | Admitting: Podiatry

## 2022-11-04 ENCOUNTER — Ambulatory Visit (INDEPENDENT_AMBULATORY_CARE_PROVIDER_SITE_OTHER): Payer: Medicare Other | Admitting: Podiatry

## 2022-11-04 DIAGNOSIS — M76821 Posterior tibial tendinitis, right leg: Secondary | ICD-10-CM

## 2022-11-04 DIAGNOSIS — M76822 Posterior tibial tendinitis, left leg: Secondary | ICD-10-CM | POA: Diagnosis not present

## 2022-11-04 NOTE — Progress Notes (Signed)
Subjective:   Patient ID: [name], male   DOB: 82 y.o.   MRN: [id number]   HPI Patient states he is improved with his left foot states it is better with the boot but still giving him some problems with flatfoot   ROS      Objective:  Physical Exam  Neurovascular status intact with patient found to have flatfoot deformity and inflammation stress in the posterior tibial tendon left as it comes under the medial malleolus that is improved but still present     Assessment:  Tendinitis of the posterior tibial tendon left with flatfoot deformity     Plan:  Improvement has occurred with boot usage so I have recommended that he keep this up and at this point I did cast for functional orthotics to try to reduce the pressure on his feet and keep the stress off of his feet.  Patient will be seen back when orthotics are returned and again may require other procedure or MRI depending on response

## 2022-11-09 DIAGNOSIS — H5051 Esophoria: Secondary | ICD-10-CM | POA: Diagnosis not present

## 2022-11-09 DIAGNOSIS — H04123 Dry eye syndrome of bilateral lacrimal glands: Secondary | ICD-10-CM | POA: Diagnosis not present

## 2022-11-09 DIAGNOSIS — S60561A Insect bite (nonvenomous) of right hand, initial encounter: Secondary | ICD-10-CM | POA: Diagnosis not present

## 2022-11-24 DIAGNOSIS — M47816 Spondylosis without myelopathy or radiculopathy, lumbar region: Secondary | ICD-10-CM | POA: Diagnosis not present

## 2022-12-02 ENCOUNTER — Other Ambulatory Visit: Payer: Self-pay | Admitting: Cardiovascular Disease

## 2022-12-03 DIAGNOSIS — M5416 Radiculopathy, lumbar region: Secondary | ICD-10-CM | POA: Diagnosis not present

## 2022-12-03 DIAGNOSIS — M47816 Spondylosis without myelopathy or radiculopathy, lumbar region: Secondary | ICD-10-CM | POA: Diagnosis not present

## 2022-12-11 ENCOUNTER — Other Ambulatory Visit: Payer: Self-pay | Admitting: Cardiovascular Disease

## 2022-12-11 DIAGNOSIS — I5042 Chronic combined systolic (congestive) and diastolic (congestive) heart failure: Secondary | ICD-10-CM

## 2022-12-11 DIAGNOSIS — E785 Hyperlipidemia, unspecified: Secondary | ICD-10-CM

## 2022-12-13 MED ORDER — TORSEMIDE 40 MG PO TABS: 40.0000 mg | ORAL_TABLET | Freq: Every day | ORAL | 9 refills | Status: DC

## 2022-12-15 DIAGNOSIS — E1122 Type 2 diabetes mellitus with diabetic chronic kidney disease: Secondary | ICD-10-CM | POA: Diagnosis not present

## 2022-12-15 DIAGNOSIS — N1831 Chronic kidney disease, stage 3a: Secondary | ICD-10-CM | POA: Diagnosis not present

## 2022-12-15 DIAGNOSIS — I130 Hypertensive heart and chronic kidney disease with heart failure and stage 1 through stage 4 chronic kidney disease, or unspecified chronic kidney disease: Secondary | ICD-10-CM | POA: Diagnosis not present

## 2022-12-15 DIAGNOSIS — E782 Mixed hyperlipidemia: Secondary | ICD-10-CM | POA: Diagnosis not present

## 2022-12-15 DIAGNOSIS — I5022 Chronic systolic (congestive) heart failure: Secondary | ICD-10-CM | POA: Diagnosis not present

## 2022-12-22 DIAGNOSIS — N1831 Chronic kidney disease, stage 3a: Secondary | ICD-10-CM | POA: Diagnosis not present

## 2022-12-22 DIAGNOSIS — E782 Mixed hyperlipidemia: Secondary | ICD-10-CM | POA: Diagnosis not present

## 2022-12-22 DIAGNOSIS — E1122 Type 2 diabetes mellitus with diabetic chronic kidney disease: Secondary | ICD-10-CM | POA: Diagnosis not present

## 2022-12-22 DIAGNOSIS — I25118 Atherosclerotic heart disease of native coronary artery with other forms of angina pectoris: Secondary | ICD-10-CM | POA: Diagnosis not present

## 2022-12-22 DIAGNOSIS — I5022 Chronic systolic (congestive) heart failure: Secondary | ICD-10-CM | POA: Diagnosis not present

## 2022-12-22 DIAGNOSIS — I130 Hypertensive heart and chronic kidney disease with heart failure and stage 1 through stage 4 chronic kidney disease, or unspecified chronic kidney disease: Secondary | ICD-10-CM | POA: Diagnosis not present

## 2022-12-26 ENCOUNTER — Ambulatory Visit (INDEPENDENT_AMBULATORY_CARE_PROVIDER_SITE_OTHER): Payer: Medicare Other | Admitting: Podiatry

## 2022-12-26 DIAGNOSIS — B351 Tinea unguium: Secondary | ICD-10-CM

## 2022-12-26 DIAGNOSIS — M79609 Pain in unspecified limb: Secondary | ICD-10-CM

## 2022-12-26 DIAGNOSIS — M76822 Posterior tibial tendinitis, left leg: Secondary | ICD-10-CM

## 2022-12-26 DIAGNOSIS — M79672 Pain in left foot: Secondary | ICD-10-CM

## 2022-12-26 NOTE — Progress Notes (Signed)
Patient presents today to pick up custom orthotics   Patient was dispensed 1 pair of custom orthotics  Fit was satisfactory. Instructions for break-in and wear was reviewed and a copy was given to the patient.    

## 2023-01-04 ENCOUNTER — Ambulatory Visit (INDEPENDENT_AMBULATORY_CARE_PROVIDER_SITE_OTHER): Payer: Medicare Other | Admitting: Podiatry

## 2023-01-04 ENCOUNTER — Encounter: Payer: Self-pay | Admitting: Podiatry

## 2023-01-04 DIAGNOSIS — M79609 Pain in unspecified limb: Secondary | ICD-10-CM

## 2023-01-04 DIAGNOSIS — B351 Tinea unguium: Secondary | ICD-10-CM | POA: Diagnosis not present

## 2023-01-04 DIAGNOSIS — D689 Coagulation defect, unspecified: Secondary | ICD-10-CM

## 2023-01-04 NOTE — Progress Notes (Signed)
This patient returns to my office for at risk foot care.  This patient requires this care by a professional since this patient will be at risk due to having coagulation defect.  Patient is taking plavix.  This patient is unable to cut nails himself since the patient cannot reach his nails.These nails are painful walking and wearing shoes.  This patient presents for at risk foot care today.  General Appearance  Alert, conversant and in no acute stress.  Vascular  Dorsalis pedis and posterior tibial  pulses are palpable  bilaterally.  Capillary return is within normal limits  bilaterally. Temperature is within normal limits  bilaterally.  Neurologic  Senn-Weinstein monofilament wire test within normal limits  bilaterally. Muscle power within normal limits bilaterally.  Nails Thick disfigured discolored nails with subungual debris  from hallux to fifth toes bilaterally. No evidence of bacterial infection or drainage bilaterally.  Orthopedic  No limitations of motion  feet .  No crepitus or effusions noted.  No bony pathology or digital deformities noted.  Skin  normotropic skin with no porokeratosis noted bilaterally.  No signs of infections or ulcers noted.     Onychomycosis  Pain in right toes  Pain in left toes  Consent was obtained for treatment procedures.   Mechanical debridement of nails 1-5  bilaterally performed with a nail nipper.  Filed with dremel without incident.    Return office visit    3 months                  Told patient to return for periodic foot care and evaluation due to potential at risk complications.   Gregory Mayer DPM  

## 2023-01-05 DIAGNOSIS — M5416 Radiculopathy, lumbar region: Secondary | ICD-10-CM | POA: Diagnosis not present

## 2023-01-23 ENCOUNTER — Ambulatory Visit: Payer: Medicare Other | Admitting: Podiatry

## 2023-01-30 ENCOUNTER — Ambulatory Visit: Payer: Medicare Other | Admitting: Podiatry

## 2023-02-02 ENCOUNTER — Ambulatory Visit: Payer: Medicare Other | Admitting: Podiatry

## 2023-02-10 ENCOUNTER — Other Ambulatory Visit: Payer: Self-pay | Admitting: Cardiovascular Disease

## 2023-03-21 DIAGNOSIS — E1122 Type 2 diabetes mellitus with diabetic chronic kidney disease: Secondary | ICD-10-CM | POA: Diagnosis not present

## 2023-03-21 DIAGNOSIS — I130 Hypertensive heart and chronic kidney disease with heart failure and stage 1 through stage 4 chronic kidney disease, or unspecified chronic kidney disease: Secondary | ICD-10-CM | POA: Diagnosis not present

## 2023-03-21 DIAGNOSIS — E782 Mixed hyperlipidemia: Secondary | ICD-10-CM | POA: Diagnosis not present

## 2023-03-27 ENCOUNTER — Encounter: Payer: Self-pay | Admitting: Cardiovascular Disease

## 2023-03-27 NOTE — Progress Notes (Unsigned)
Cardiology Office Note:    Date:  03/28/2023   ID:  [name], DOB 06/09/1940, MRN [id number]  PCP:  Georgianne Fick, MD   [facility] Providers Cardiologist:  Kristeen Miss, MD {    Referring MD: Georgianne Fick, MD   Chief Complaint  Patient presents with   Congestive Heart Failure     History of Present Illness:    [name] is a 82 y.o. male with a hx of coronary artery disease, status post stent placement, congestive heart failure  He presents today for further evaluation and management of increasing shortness of breath.  Echo [facility] - images not available ) report indicates he has normal LV systolic function,  grade I diastolic dysfunction.    Sept. 14, 2023  Here with worsening dyspnea  Chest pressure  With waking , feels presyncopal  Feels unsteady on his feet.   Had a stent placed several years ago Symptoms might be similar ,  Is now on Repatha   The progressive DOE / chest pressure has been present for several months   Eats sausage and bologna several times a week .   Medical doctor started Lasix 40 mg a day  Does not urinate much with that. Wt is 229  Sept. 15, 2023 [name] is seen for follow up of his dyspnea and chest tightness He was seen yesterday with apparent volume overload + rales bilaterally  Eats a very salty diet every day - Ive asked him to cut out his hot dogs and bologna   We started him on torsemide  ( instead of furosemide ) when he went to his pharmacy they stated that they did not have the torsemide ordered.  We have [facility] that [facility]  Since he was not able to take the torsemide he doubled up on his Lasix.  He put out quite a bit more urine.  His weight today is 226 pounds which is down 3 pounds from yesterday.  He states that he is not really breathing all that much better.   Sept. 19, 2023  [name] is seen today for follow up of his CHF  Has  reduced his salt intake. Wt is 224 lbs today ( down 2 lbs)  Eating salad, lots of fruit ( will need to check BMP / potassium today )  He still is not breathing well.  He still gets these episodes of hot sweats.  He still has severe shortness of breath with any activity.  We will schedule him for right and left heart catheterization.  We have discussed the risk, benefits, options of heart catheterization.  He understands and agrees to proceed.   September 05, 2022 [name] is seen for follow up of his CHF  He had shock wave lithotripsy and stenting of his prox LAD on Sept. 2023  Has some leg aches ,   He is on both Repatha and rosuvastatin.  Will have him hold his rosuvastatin for 1 month to 2 months to see if his leg aches improved.  If they do improve then we will continue with Repatha alone.  If they do not improve with holding the rosuvastatin we may consider restarting it.  Has left basilar rales,  has run out of his Torsemide    Oct. 22, 2024 [name] is seen for follow up of his CAD, CHF, HLD  Still has DOE with exertion Not able to exercise Has no energy - "feels  like a zombie"  Echocardiogram from August, 2023 reveals normal left ventricular systolic function.  He has mild, grade 1 diastolic dysfunction.  Mild mitral regurgitation.  Lots of fatigue  - check CBC and TSH today   Encouraged him to exercise He enjoys swimming    Past Medical History:  Diagnosis Date   Adenomatous colon polyp    Arthritis    CAD (coronary artery disease)    a. MI in 1996 with stent placement b. cath 01/18/17 - S/p PCI of in-stent restenosis of pLAD with cutting ballon & DES; 20% ostial LAD; 40% pro Cx; 60% focal pRCA   Clostridium difficile infection    Depression    Diverticulosis    H. pylori infection    HTN (hypertension)    Hyperlipidemia    MI (myocardial infarction) [facility])     Past Surgical History:  Procedure Laterality Date   BRAIN TUMOR EXCISION  1954   benign tumor in back of head,  done at  [facility]   CARDIAC CATHETERIZATION     COLONOSCOPY     hx of polyps   CORONARY ANGIOPLASTY WITH STENT PLACEMENT  1996; 1997; 01/18/2017   CORONARY ATHERECTOMY N/A 02/28/2022   Procedure: CORONARY ATHERECTOMY;  Surgeon: Orbie Pyo, MD;  Location: [facility] INVASIVE CV LAB;  Service: Cardiovascular;  Laterality: N/A;   CORONARY IMAGING/OCT N/A 02/28/2022   Procedure: INTRAVASCULAR IMAGING/OCT;  Surgeon: Orbie Pyo, MD;  Location: [facility] INVASIVE CV LAB;  Service: Cardiovascular;  Laterality: N/A;   CORONARY LITHOTRIPSY N/A 02/28/2022   Procedure: CORONARY LITHOTRIPSY;  Surgeon: Orbie Pyo, MD;  Location: [facility] INVASIVE CV LAB;  Service: Cardiovascular;  Laterality: N/A;   CORONARY PRESSURE/FFR STUDY N/A 07/28/2020   Procedure: INTRAVASCULAR PRESSURE WIRE/FFR STUDY;  Surgeon: Marykay Lex, MD;  Location: [facility] INVASIVE CV LAB;  Service: Cardiovascular;  Laterality: N/A;   CORONARY PRESSURE/FFR STUDY N/A 02/28/2022   Procedure: INTRAVASCULAR PRESSURE WIRE/FFR STUDY;  Surgeon: Orbie Pyo, MD;  Location: [facility] CV [facility];  Service: Cardiovascular;  Laterality: N/A;   CORONARY STENT INTERVENTION N/A 01/18/2017   Procedure: CORONARY STENT INTERVENTION;  Surgeon: Lennette Bihari, MD;  Location: [facility];  Service: Cardiovascular;  Laterality: N/A;   CORONARY STENT INTERVENTION N/A 02/28/2022   Procedure: CORONARY STENT INTERVENTION;  Surgeon: Orbie Pyo, MD;  Location: [facility];  Service: Cardiovascular;  Laterality: N/A;   ESOPHAGOGASTRODUODENOSCOPY (EGD) WITH PROPOFOL N/A 06/14/2018   Procedure: ESOPHAGOGASTRODUODENOSCOPY (EGD) WITH PROPOFOL;  Surgeon: Iva Boop, MD;  Location: [facility]OS[facility];  Service: Endoscopy;  Laterality: N/A;   JOINT REPLACEMENT     LEFT HEART CATH AND CORONARY ANGIOGRAPHY N/A 01/18/2017   Procedure: LEFT HEART CATH AND CORONARY ANGIOGRAPHY;  Surgeon: Lennette Bihari, MD;  Location: [facility];  Service: Cardiovascular;  Laterality:  N/A;   LEFT HEART CATH AND CORONARY ANGIOGRAPHY N/A 07/28/2020   Procedure: LEFT HEART CATH AND CORONARY ANGIOGRAPHY;  Surgeon: Marykay Lex, MD;  Location: [facility] INVASIVE CV LAB;  Service: Cardiovascular;  Laterality: N/A;   RIGHT/LEFT HEART CATH AND CORONARY ANGIOGRAPHY N/A 02/28/2022   Procedure: RIGHT/LEFT HEART CATH AND CORONARY ANGIOGRAPHY;  Surgeon: Orbie Pyo, MD;  Location: [facility] INVASIVE CV [facility];  Service: Cardiovascular;  Laterality: N/A;   stent x2 in the LAD  with intra-aortic balloon pump support  1996   TONSILLECTOMY     TOTAL KNEE ARTHROPLASTY Bilateral     Current Medications: Current Meds  Medication Sig  acetaminophen (TYLENOL) 325 MG tablet Take 325 mg by mouth 3 (three) times daily as needed.   Aspirin 81 MG CAPS Take 1 capsule every day by oral route.   clobetasol (TEMOVATE) 0.05 % external solution Apply topically 2 (two) times daily as needed. APPLY TO AFFECTED AREA ON THE SCALP TWICE A DAY AS NEEDED   clopidogrel (PLAVIX) 75 MG tablet TAKE ONE TABLET BY MOUTH ONCE DAILY   escitalopram (LEXAPRO) 20 MG tablet Take 20 mg by mouth daily.   fenofibrate (TRICOR) 145 MG tablet TAKE ONE TABLET BY MOUTH ONCE DAILY   isosorbide mononitrate (IMDUR) 30 MG 24 hr tablet TAKE 1 & 1/2 TABLETS BY MOUTH ONCE DAILY   nitroGLYCERIN (NITROSTAT) 0.4 MG SL tablet Place 1 tablet (0.4 mg total) under the tongue every 5 (five) minutes as needed for chest pain.   pantoprazole (PROTONIX) 40 MG tablet Take 40 mg by mouth daily.   potassium chloride SA (KLOR-CON M) 20 MEQ tablet Take 20 mEq by mouth daily.   Torsemide 40 MG TABS Take 40 mg by mouth daily.     Allergies:   Gabapentin   Social History   Socioeconomic History   Marital status: Widowed    Spouse name: Not on file   Number of children: 1   Years of education: Not on file   Highest education level: Not on file  Occupational History   Occupation: Retired  Tobacco Use   Smoking status: Former    Current packs/day: 0.00     Types: Cigarettes    Quit date: 06/06/1994    Years since quitting: 28.8   Smokeless tobacco: Never  Vaping Use   Vaping status: Never Used  Substance and Sexual Activity   Alcohol use: No   Drug use: No   Sexual activity: Yes  Other Topics Concern   Not on file  Social History Narrative   Not on file   Social Determinants of Health   Financial Resource Strain: Not on file  Food Insecurity: No Food Insecurity (02/28/2022)   Hunger Vital Sign    Worried About Running Out of Food in the Last Year: Never true    Ran Out of Food in the Last Year: Never true  Transportation Needs: No Transportation Needs (02/28/2022)   PRAPARE - Administrator, Civil Service (Medical): No    Lack of Transportation (Non-Medical): No  Physical Activity: Not on file  Stress: Not on file  Social Connections: Not on file     Family History: The patient's family history includes Cancer in his sister; Heart attack in his mother; Heart attack (age of onset: 37) in his brother. There is no history of Colon cancer, Stomach cancer, Rectal cancer, or Esophageal cancer.  ROS:   Please see the history of present illness.     All other systems reviewed and are negative.  EKGs/Labs/Other Studies Reviewed:    The following studies were reviewed today:   EKG:   EKG Interpretation Date/Time:  Tuesday March 28 2023 10:42:17 EDT Ventricular Rate:  67 PR Interval:  174 QRS Duration:  126 QT Interval:  418 QTC Calculation: 441 R Axis:   -70  Text Interpretation: Normal sinus rhythm Left axis deviation Non-specific intra-ventricular conduction block Cannot rule out Septal infarct (cited on or before 28-Feb-2022) When compared with ECG of 01-Mar-2022 06:12, Non-specific intra-ventricular conduction block has replaced Right bundle branch block Questionable change in initial forces of Septal leads Confirmed by Kristeen Miss ([id number]) on 03/28/2023 11:12:18  AM       Recent Labs: No results found  for requested labs within last 365 days.  Recent Lipid Panel    Component Value Date/Time   CHOL 195 12/21/2021 1059   TRIG 119 12/21/2021 1059   HDL 56 12/21/2021 1059   CHOLHDL 3.5 12/21/2021 1059   CHOLHDL 4 06/23/2009 1147   VLDL 23.4 06/23/2009 1147   LDLCALC 118 (H) 12/21/2021 1059   LDLDIRECT 159.1 12/10/2007 0851     Risk Assessment/Calculations:               Physical Exam:    Physical Exam: Blood pressure 110/82, pulse 67, height 5\' 9"  (1.753 m), weight 226 lb 3.2 oz (102.6 kg), SpO2 93%.      GEN:  Well nourished, well developed in no acute distress HEENT: Normal NECK: No JVD; No carotid bruits LYMPHATICS: No lymphadenopathy CARDIAC: RRR  RESPIRATORY:  Clear to auscultation without rales, wheezing or rhonchi  ABDOMEN: Soft, non-tender, non-distended MUSCULOSKELETAL:  No edema; No deformity  SKIN: Warm and dry NEUROLOGIC:  Alert and oriented x 3    ASSESSMENT:    1. Follow-up exam   2. Coronary artery disease involving native coronary artery of native heart without angina pectoris   3. Primary hypertension   4. Other fatigue   5. Chronic diastolic CHF (congestive heart failure) (HCC)        PLAN:       Acute on chronic diastolic congestive heart failure: .  He has chronic diastolic congestive heart failure.  He appears to be euvolemic at this time.  I encouraged him to work on some weight reduction.  He needs to increase his exercise.   2.  Mild chronic kidney disease:  per primary MD    3.  CAD ; he denies any angina.  4.  Generalized fatigue: He complains of generalized fatigue.  Will check a TSH today and a CBC.  I will forward these results to his primary medical doctor         Medication Adjustments/Labs and Tests Ordered: Current medicines are reviewed at length with the patient today.  Concerns regarding medicines are outlined above.  Orders Placed This Encounter  Procedures   TSH   CBC   EKG 12-Lead   No orders of the  defined types were placed in this encounter.    Patient Instructions  Lab Work: TSH, CBC today If you have labs (blood work) drawn today and your tests are completely normal, you will receive your results only by: MyChart Message (if you have MyChart) OR A paper copy in the mail If you have any lab test that is abnormal or we need to change your treatment, we will call you to review the results.  Follow-Up: At [facility], you and your health needs are our priority.  As part of our continuing mission to provide you with exceptional heart care, we have created designated Provider Care Teams.  These Care Teams include your primary Cardiologist (physician) and Advanced Practice Providers (APPs -  Physician Assistants and Nurse Practitioners) who all work together to provide you with the care you need, when you need it.  We recommend signing up for the patient portal called "MyChart".  Sign up information is provided on this After Visit Summary.  MyChart is used to connect with patients for Virtual Visits (Telemedicine).  Patients are able to view lab/test results, encounter notes, upcoming appointments, etc.  Non-urgent messages can be sent to your provider  as well.   To learn more about what you can do with MyChart, go to [email or web address].    Your next appointment:   6 month(s)  Provider:   Kristeen Miss, MD        Signed, Kristeen Miss, MD  03/28/2023 11:13 AM    [facility]

## 2023-03-28 ENCOUNTER — Ambulatory Visit: Payer: Medicare Other | Attending: Cardiovascular Disease | Admitting: Cardiovascular Disease

## 2023-03-28 ENCOUNTER — Encounter: Payer: Self-pay | Admitting: Cardiovascular Disease

## 2023-03-28 VITALS — BP 110/82 | HR 67 | Ht 69.0 in | Wt 226.2 lb

## 2023-03-28 DIAGNOSIS — I2510 Atherosclerotic heart disease of native coronary artery without angina pectoris: Secondary | ICD-10-CM | POA: Insufficient documentation

## 2023-03-28 DIAGNOSIS — I25118 Atherosclerotic heart disease of native coronary artery with other forms of angina pectoris: Secondary | ICD-10-CM | POA: Diagnosis not present

## 2023-03-28 DIAGNOSIS — R5383 Other fatigue: Secondary | ICD-10-CM | POA: Diagnosis not present

## 2023-03-28 DIAGNOSIS — E1122 Type 2 diabetes mellitus with diabetic chronic kidney disease: Secondary | ICD-10-CM | POA: Diagnosis not present

## 2023-03-28 DIAGNOSIS — I5032 Chronic diastolic (congestive) heart failure: Secondary | ICD-10-CM | POA: Diagnosis not present

## 2023-03-28 DIAGNOSIS — N1831 Chronic kidney disease, stage 3a: Secondary | ICD-10-CM | POA: Diagnosis not present

## 2023-03-28 DIAGNOSIS — I10 Essential (primary) hypertension: Secondary | ICD-10-CM | POA: Insufficient documentation

## 2023-03-28 DIAGNOSIS — I130 Hypertensive heart and chronic kidney disease with heart failure and stage 1 through stage 4 chronic kidney disease, or unspecified chronic kidney disease: Secondary | ICD-10-CM | POA: Diagnosis not present

## 2023-03-28 DIAGNOSIS — I5022 Chronic systolic (congestive) heart failure: Secondary | ICD-10-CM | POA: Diagnosis not present

## 2023-03-28 DIAGNOSIS — Z23 Encounter for immunization: Secondary | ICD-10-CM | POA: Diagnosis not present

## 2023-03-28 DIAGNOSIS — E782 Mixed hyperlipidemia: Secondary | ICD-10-CM | POA: Diagnosis not present

## 2023-03-28 DIAGNOSIS — Z09 Encounter for follow-up examination after completed treatment for conditions other than malignant neoplasm: Secondary | ICD-10-CM | POA: Diagnosis not present

## 2023-03-28 NOTE — Patient Instructions (Signed)
Lab Work: TSH, CBC today If you have labs (blood work) drawn today and your tests are completely normal, you will receive your results only by: MyChart Message (if you have MyChart) OR A paper copy in the mail If you have any lab test that is abnormal or we need to change your treatment, we will call you to review the results.  Follow-Up: At [facility], you and your health needs are our priority.  As part of our continuing mission to provide you with exceptional heart care, we have created designated Provider Care Teams.  These Care Teams include your primary Cardiologist (physician) and Advanced Practice Providers (APPs -  Physician Assistants and Nurse Practitioners) who all work together to provide you with the care you need, when you need it.  We recommend signing up for the patient portal called "MyChart".  Sign up information is provided on this After Visit Summary.  MyChart is used to connect with patients for Virtual Visits (Telemedicine).  Patients are able to view lab/test results, encounter notes, upcoming appointments, etc.  Non-urgent messages can be sent to your provider as well.   To learn more about what you can do with MyChart, go to [email or web address].    Your next appointment:   6 month(s)  Provider:   Kristeen Miss, MD

## 2023-03-29 LAB — CBC
Hematocrit: 49.8 % (ref 37.5–51.0)
Hemoglobin: 16.3 g/dL (ref 13.0–17.7)
MCH: 30.8 pg (ref 26.6–33.0)
MCHC: 32.7 g/dL (ref 31.5–35.7)
MCV: 94 fL (ref 79–97)
Platelets: 227 10*3/uL (ref 150–450)
RBC: 5.29 x10E6/uL (ref 4.14–5.80)
RDW: 13.2 % (ref 11.6–15.4)
WBC: 4.2 10*3/uL (ref 3.4–10.8)

## 2023-03-29 LAB — TSH: TSH: 2.02 u[IU]/mL (ref 0.450–4.500)

## 2023-04-05 DIAGNOSIS — M47896 Other spondylosis, lumbar region: Secondary | ICD-10-CM | POA: Diagnosis not present

## 2023-04-05 DIAGNOSIS — I4891 Unspecified atrial fibrillation: Secondary | ICD-10-CM | POA: Diagnosis not present

## 2023-04-06 ENCOUNTER — Encounter: Payer: Self-pay | Admitting: Podiatry

## 2023-04-06 ENCOUNTER — Ambulatory Visit (INDEPENDENT_AMBULATORY_CARE_PROVIDER_SITE_OTHER): Payer: Medicare Other | Admitting: Podiatry

## 2023-04-06 DIAGNOSIS — B351 Tinea unguium: Secondary | ICD-10-CM | POA: Diagnosis not present

## 2023-04-06 DIAGNOSIS — M79609 Pain in unspecified limb: Secondary | ICD-10-CM | POA: Diagnosis not present

## 2023-04-06 DIAGNOSIS — D689 Coagulation defect, unspecified: Secondary | ICD-10-CM

## 2023-04-06 NOTE — Progress Notes (Signed)
This patient returns to my office for at risk foot care.  This patient requires this care by a professional since this patient will be at risk due to having coagulation defect.  Patient is taking plavix.  This patient is unable to cut nails himself since the patient cannot reach his nails.These nails are painful walking and wearing shoes.  This patient presents for at risk foot care today.  General Appearance  Alert, conversant and in no acute stress.  Vascular  Dorsalis pedis and posterior tibial  pulses are palpable  bilaterally.  Capillary return is within normal limits  bilaterally. Temperature is within normal limits  bilaterally.  Neurologic  Senn-Weinstein monofilament wire test within normal limits  bilaterally. Muscle power within normal limits bilaterally.  Nails Thick disfigured discolored nails with subungual debris  from hallux to fifth toes bilaterally. No evidence of bacterial infection or drainage bilaterally.  Orthopedic  No limitations of motion  feet .  No crepitus or effusions noted.  No bony pathology or digital deformities noted.  Skin  normotropic skin with no porokeratosis noted bilaterally.  No signs of infections or ulcers noted.     Onychomycosis  Pain in right toes  Pain in left toes  Consent was obtained for treatment procedures.   Mechanical debridement of nails 1-5  bilaterally performed with a nail nipper.  Filed with dremel without incident.    Return office visit    3 months                  Told patient to return for periodic foot care and evaluation due to potential at risk complications.   Gregory Mayer DPM  

## 2023-04-18 ENCOUNTER — Other Ambulatory Visit: Payer: Self-pay | Admitting: Cardiovascular Disease

## 2023-05-23 DIAGNOSIS — H04411 Chronic dacryocystitis of right lacrimal passage: Secondary | ICD-10-CM | POA: Diagnosis not present

## 2023-05-23 DIAGNOSIS — H524 Presbyopia: Secondary | ICD-10-CM | POA: Diagnosis not present

## 2023-05-23 DIAGNOSIS — H31011 Macula scars of posterior pole (postinflammatory) (post-traumatic), right eye: Secondary | ICD-10-CM | POA: Diagnosis not present

## 2023-05-23 DIAGNOSIS — H5051 Esophoria: Secondary | ICD-10-CM | POA: Diagnosis not present

## 2023-05-23 DIAGNOSIS — H31093 Other chorioretinal scars, bilateral: Secondary | ICD-10-CM | POA: Diagnosis not present

## 2023-05-25 DIAGNOSIS — H906 Mixed conductive and sensorineural hearing loss, bilateral: Secondary | ICD-10-CM | POA: Diagnosis not present

## 2023-05-25 DIAGNOSIS — R2689 Other abnormalities of gait and mobility: Secondary | ICD-10-CM | POA: Diagnosis not present

## 2023-05-25 DIAGNOSIS — H9313 Tinnitus, bilateral: Secondary | ICD-10-CM | POA: Diagnosis not present

## 2023-05-25 DIAGNOSIS — Z8669 Personal history of other diseases of the nervous system and sense organs: Secondary | ICD-10-CM | POA: Diagnosis not present

## 2023-06-27 DIAGNOSIS — E1122 Type 2 diabetes mellitus with diabetic chronic kidney disease: Secondary | ICD-10-CM | POA: Diagnosis not present

## 2023-06-27 DIAGNOSIS — I25118 Atherosclerotic heart disease of native coronary artery with other forms of angina pectoris: Secondary | ICD-10-CM | POA: Diagnosis not present

## 2023-06-27 DIAGNOSIS — E782 Mixed hyperlipidemia: Secondary | ICD-10-CM | POA: Diagnosis not present

## 2023-06-27 DIAGNOSIS — N1831 Chronic kidney disease, stage 3a: Secondary | ICD-10-CM | POA: Diagnosis not present

## 2023-06-27 DIAGNOSIS — R5383 Other fatigue: Secondary | ICD-10-CM | POA: Diagnosis not present

## 2023-06-27 DIAGNOSIS — I130 Hypertensive heart and chronic kidney disease with heart failure and stage 1 through stage 4 chronic kidney disease, or unspecified chronic kidney disease: Secondary | ICD-10-CM | POA: Diagnosis not present

## 2023-06-27 DIAGNOSIS — I5022 Chronic systolic (congestive) heart failure: Secondary | ICD-10-CM | POA: Diagnosis not present

## 2023-07-02 ENCOUNTER — Other Ambulatory Visit: Payer: Self-pay | Admitting: Cardiovascular Disease

## 2023-07-04 DIAGNOSIS — E782 Mixed hyperlipidemia: Secondary | ICD-10-CM | POA: Diagnosis not present

## 2023-07-04 DIAGNOSIS — I25118 Atherosclerotic heart disease of native coronary artery with other forms of angina pectoris: Secondary | ICD-10-CM | POA: Diagnosis not present

## 2023-07-04 DIAGNOSIS — I5022 Chronic systolic (congestive) heart failure: Secondary | ICD-10-CM | POA: Diagnosis not present

## 2023-07-04 DIAGNOSIS — Z0000 Encounter for general adult medical examination without abnormal findings: Secondary | ICD-10-CM | POA: Diagnosis not present

## 2023-07-04 DIAGNOSIS — F323 Major depressive disorder, single episode, severe with psychotic features: Secondary | ICD-10-CM | POA: Diagnosis not present

## 2023-07-04 DIAGNOSIS — N1831 Chronic kidney disease, stage 3a: Secondary | ICD-10-CM | POA: Diagnosis not present

## 2023-07-04 DIAGNOSIS — E1122 Type 2 diabetes mellitus with diabetic chronic kidney disease: Secondary | ICD-10-CM | POA: Diagnosis not present

## 2023-07-04 DIAGNOSIS — I130 Hypertensive heart and chronic kidney disease with heart failure and stage 1 through stage 4 chronic kidney disease, or unspecified chronic kidney disease: Secondary | ICD-10-CM | POA: Diagnosis not present

## 2023-07-06 ENCOUNTER — Ambulatory Visit (INDEPENDENT_AMBULATORY_CARE_PROVIDER_SITE_OTHER): Payer: Medicare Other | Admitting: Podiatry

## 2023-07-06 ENCOUNTER — Encounter: Payer: Self-pay | Admitting: Podiatry

## 2023-07-06 DIAGNOSIS — M79609 Pain in unspecified limb: Secondary | ICD-10-CM | POA: Diagnosis not present

## 2023-07-06 DIAGNOSIS — B351 Tinea unguium: Secondary | ICD-10-CM

## 2023-07-06 NOTE — Progress Notes (Signed)
This patient returns to my office for at risk foot care.  This patient requires this care by a professional since this patient will be at risk due to having coagulation defect.  Patient is taking plavix.  This patient is unable to cut nails himself since the patient cannot reach his nails.These nails are painful walking and wearing shoes.  This patient presents for at risk foot care today.  General Appearance  Alert, conversant and in no acute stress.  Vascular  Dorsalis pedis and posterior tibial  pulses are palpable  bilaterally.  Capillary return is within normal limits  bilaterally. Temperature is within normal limits  bilaterally.  Neurologic  Senn-Weinstein monofilament wire test within normal limits  bilaterally. Muscle power within normal limits bilaterally.  Nails Thick disfigured discolored nails with subungual debris  from hallux to fifth toes bilaterally. No evidence of bacterial infection or drainage bilaterally.  Orthopedic  No limitations of motion  feet .  No crepitus or effusions noted.  No bony pathology or digital deformities noted.  Skin  normotropic skin with no porokeratosis noted bilaterally.  No signs of infections or ulcers noted.     Onychomycosis  Pain in right toes  Pain in left toes  Consent was obtained for treatment procedures.   Mechanical debridement of nails 1-5  bilaterally performed with a nail nipper.  Filed with dremel without incident.    Return office visit    3   months                  Told patient to return for periodic foot care and evaluation due to potential at risk complications.   Helane Gunther DPM

## 2023-08-07 DIAGNOSIS — F322 Major depressive disorder, single episode, severe without psychotic features: Secondary | ICD-10-CM | POA: Diagnosis not present

## 2023-08-21 ENCOUNTER — Telehealth: Payer: Self-pay | Admitting: Cardiovascular Disease

## 2023-08-21 NOTE — Telephone Encounter (Signed)
 Patient identification verified by 2 forms. Marilynn Rail, RN    Called and spoke to patient  Patient states:   -he is having chest pain   -also has left arm pain   -last episode of pain was yesterday   -chest pain on going for 3-4 days, not worsening   -pain lasts 1-2 minutes   -pain resolves on it own   -he has SOB/difficulty breathing, occurs with exertion  Patient denies:   -active chest pain at time of call  Patient scheduled for OV 3/18 at 9:20am  Advised patient to present to ED if symptoms change/worsen Patient aware best to present to ED for active chest pain instead of waiting for OV  Reviewed ED warning signs/precautions  Patient verbalized understanding, no questions at this time

## 2023-08-21 NOTE — Telephone Encounter (Signed)
   Pt c/o of Chest Pain: STAT if active CP, including tightness, pressure, jaw pain, radiating pain to shoulder/upper arm/back, CP unrelieved by Nitro. Symptoms reported of SOB, nausea, vomiting, sweating.  1. Are you having CP right now? no    2. Are you experiencing any other symptoms (ex. SOB, nausea, vomiting, sweating)? Sweating, sob   3. Is your CP continuous or coming and going? Coming and going   4. Have you taken Nitroglycerin? no   5. How long have you been experiencing CP? 2 weeks    6. If NO CP at time of call then end call with telling Pt to call back or call 911 if Chest pain returns prior to return call from triage team.

## 2023-08-22 ENCOUNTER — Ambulatory Visit: Attending: Cardiovascular Disease | Admitting: Cardiovascular Disease

## 2023-08-22 ENCOUNTER — Encounter: Payer: Self-pay | Admitting: Cardiovascular Disease

## 2023-08-22 VITALS — BP 120/88 | HR 64 | Ht 69.0 in | Wt 221.6 lb

## 2023-08-22 DIAGNOSIS — I10 Essential (primary) hypertension: Secondary | ICD-10-CM | POA: Diagnosis not present

## 2023-08-22 DIAGNOSIS — E782 Mixed hyperlipidemia: Secondary | ICD-10-CM | POA: Diagnosis not present

## 2023-08-22 DIAGNOSIS — I5032 Chronic diastolic (congestive) heart failure: Secondary | ICD-10-CM | POA: Diagnosis not present

## 2023-08-22 DIAGNOSIS — R5383 Other fatigue: Secondary | ICD-10-CM | POA: Diagnosis not present

## 2023-08-22 DIAGNOSIS — I2510 Atherosclerotic heart disease of native coronary artery without angina pectoris: Secondary | ICD-10-CM | POA: Insufficient documentation

## 2023-08-22 DIAGNOSIS — I5043 Acute on chronic combined systolic (congestive) and diastolic (congestive) heart failure: Secondary | ICD-10-CM | POA: Diagnosis not present

## 2023-08-22 NOTE — Progress Notes (Signed)
 Cardiology Office Note:    Date:  08/22/2023   ID:  [name], DOB 05/28/1941, MRN [id number]  PCP:  Georgianne Fick, MD   [facility] Providers Cardiologist:  Kristeen Miss, MD {    Referring MD: Georgianne Fick, MD   Chief Complaint  Patient presents with   Fatigue   Congestive Heart Failure          History of Present Illness:    [name] is a 83 y.o. male with a hx of coronary artery disease, status post stent placement, congestive heart failure  He presents today for further evaluation and management of increasing shortness of breath.  Echo [facility] - images not available ) report indicates he has normal LV systolic function,  grade I diastolic dysfunction.    Sept. 14, 2023  Here with worsening dyspnea  Chest pressure  With waking , feels presyncopal  Feels unsteady on his feet.   Had a stent placed several years ago Symptoms might be similar ,  Is now on Repatha   The progressive DOE / chest pressure has been present for several months   Eats sausage and bologna several times a week .   Medical doctor started Lasix 40 mg a day  Does not urinate much with that. Wt is 229  Sept. 15, 2023 [name] is seen for follow up of his dyspnea and chest tightness He was seen yesterday with apparent volume overload + rales bilaterally  Eats a very salty diet every day - Ive asked him to cut out his hot dogs and bologna   We started him on torsemide  ( instead of furosemide ) when he went to his pharmacy they stated that they did not have the torsemide ordered.  We have [facility] that [facility]  Since he was not able to take the torsemide he doubled up on his Lasix.  He put out quite a bit more urine.  His weight today is 226 pounds which is down 3 pounds from yesterday.  He states that he is not really breathing all that much better.   Sept. 19, 2023  [name] is seen today for follow up of  his CHF  Has reduced his salt intake. Wt is 224 lbs today ( down 2 lbs)  Eating salad, lots of fruit ( will need to check BMP / potassium today )  He still is not breathing well.  He still gets these episodes of hot sweats.  He still has severe shortness of breath with any activity.  We will schedule him for right and left heart catheterization.  We have discussed the risk, benefits, options of heart catheterization.  He understands and agrees to proceed.   September 05, 2022 [name] is seen for follow up of his CHF  He had shock wave lithotripsy and stenting of his prox LAD on Sept. 2023  Has some leg aches ,   He is on both Repatha and rosuvastatin.  Will have him hold his rosuvastatin for 1 month to 2 months to see if his leg aches improved.  If they do improve then we will continue with Repatha alone.  If they do not improve with holding the rosuvastatin we may consider restarting it.  Has left basilar rales,  has run out of his Torsemide    Oct. 22, 2024 [name] is seen for follow up of his CAD, CHF, HLD  Still has DOE with exertion Not  able to exercise Has no energy - "feels like a zombie"  Echocardiogram from August, 2023 reveals normal left ventricular systolic function.  He has mild, grade 1 diastolic dysfunction.  Mild mitral regurgitation.  Lots of fatigue  - check CBC and TSH today   Encouraged him to exercise He enjoys swimming    August 22, 2023 [name] is seen for his chronic diastolic CHF Still fatigued constantly Wakes up tired,  could sleep until 3 PM if he had the chance   His LDL was 219 in Jan.  2025 Was started on Repatha at that point   Is being treated for depression by his primary   Had coronary stenting in 2023    Past Medical History:  Diagnosis Date   Adenomatous colon polyp    Arthritis    CAD (coronary artery disease)    a. MI in 1996 with stent placement b. cath 01/18/17 - S/p PCI of in-stent restenosis of pLAD with cutting ballon & DES; 20%  ostial LAD; 40% pro Cx; 60% focal pRCA   Clostridium difficile infection    Depression    Diverticulosis    H. pylori infection    HTN (hypertension)    Hyperlipidemia    MI (myocardial infarction) [facility])     Past Surgical History:  Procedure Laterality Date   BRAIN TUMOR EXCISION  1954   benign tumor in back of head, done at  [facility]   CARDIAC CATHETERIZATION     COLONOSCOPY     hx of polyps   CORONARY ANGIOPLASTY WITH STENT PLACEMENT  1996; 1997; 01/18/2017   CORONARY ATHERECTOMY N/A 02/28/2022   Procedure: CORONARY ATHERECTOMY;  Surgeon: Orbie Pyo, MD;  [facility]: [facility] INVASIVE CV LAB;  Service: Cardiovascular;  Laterality: N/A;   CORONARY IMAGING/OCT N/A 02/28/2022   Procedure: INTRAVASCULAR IMAGING/OCT;  Surgeon: Orbie Pyo, MD;  Location: [facility] INVASIVE CV LAB;  Service: Cardiovascular;  Laterality: N/A;   CORONARY LITHOTRIPSY N/A 02/28/2022   Procedure: CORONARY LITHOTRIPSY;  Surgeon: Orbie Pyo, MD;  Location: [facility] INVAS[facility] CV LAB;  Service: Cardiovascular;  Laterality: N/A;   CORONARY PRESSURE/FFR STUDY N/A 07/28/2020   Procedure: INTRAVASCULAR PRESSURE WIRE/FFR STUDY;  Surgeon: Marykay Lex, MD;  Location: [facility] INVASIVE CV LAB;  Service: Cardiovascular;  Laterality: N/A;   CORONARY PRESSURE/FFR STUDY N/A 02/28/2022   Procedure: INTRAVASCULAR PRESSURE WIRE/FFR STUDY;  Surgeon: Orbie Pyo, MD;  Location: [facility];  Service: Cardiovascular;  Laterality: N/A;   CORONARY STENT INTERVENTION N/A 01/18/2017   Procedure: CORONARY STENT INTERVENTION;  Surgeon: Lennette Bihari, MD;  Location: [facility];  Service: Cardiovascular;  Laterality: N/A;   CORONARY STENT INTERVENTION N/A 02/28/2022   Procedure: CORONARY STENT INTERVENTION;  Surgeon: Orbie Pyo, MD;  Location: [facility];  Service: Cardiovascular;  Laterality: N/A;   ESOPHAGOGASTRODUODENOSCOPY (EGD) WITH PROPOFOL N/A 06/14/2018   Procedure: ESOPHAGOGASTRODUODENOSCOPY (EGD) WITH PROPOFOL;   Surgeon: Iva Boop, MD;  Location: [facility];  Service: Endoscopy;  Laterality: N/A;   JOINT REPLACEMENT     LEFT HEART CATH AND CORONARY ANGIOGRAPHY N/A 01/18/2017   Procedure: LEFT HEART CATH AND CORONARY ANGIOGRAPHY;  Surgeon: Lennette Bihari, MD;  Location: [facility];  Service: Cardiovascular;  Laterality: N/A;   LEFT HEART CATH AND CORONARY ANGIOGRAPHY N/A 07/28/2020   Procedure: LEFT HEART CATH AND CORONARY ANGIOGRAPHY;  Surgeon: Marykay Lex, MD;  Location: [facility] INVASIVE CV LAB;  Service: Cardiovascular;  Laterality: N/A;   RIGHT/LEFT  HEART CATH AND CORONARY ANGIOGRAPHY N/A 02/28/2022   Procedure: RIGHT/LEFT HEART CATH AND CORONARY ANGIOGRAPHY;  Surgeon: Orbie Pyo, MD;  Location: [facility]ASIVE CV [facility];  Service: Cardiovascular;  Laterality: N/A;   stent x2 in the LAD  with intra-aortic balloon pump support  1996   TONSILLECTOMY     TOTAL KNEE ARTHROPLASTY Bilateral     Current Medications: Current Meds  Medication Sig   acetaminophen (TYLENOL) 325 MG tablet Take 325 mg by mouth 3 (three) times daily as needed.   Aspirin 81 MG CAPS Take 1 capsule every day by oral route.   clobetasol (TEMOVATE) 0.05 % external solution Apply topically 2 (two) times daily as needed. APPLY TO AFFECTED AREA ON THE SCALP TWICE A DAY AS NEEDED   clopidogrel (PLAVIX) 75 MG tablet TAKE 1 TABLET BY MOUTH ONCE DAILY   dalfampridine 10 MG TB12 Take by mouth.   escitalopram (LEXAPRO) 20 MG tablet Take 20 mg by mouth daily.   fenofibrate (TRICOR) 145 MG tablet TAKE ONE TABLET BY MOUTH ONCE DAILY   isosorbide mononitrate (IMDUR) 30 MG 24 hr tablet TAKE 1 & 1/2 TABLETS BY MOUTH ONCE DAILY   nitroGLYCERIN (NITROSTAT) 0.4 MG SL tablet Place 1 tablet (0.4 mg total) under the tongue every 5 (five) minutes as needed for chest pain.   oxyCODONE-acetaminophen (PERCOCET) 5-325 MG tablet Take 1 tablet by mouth every 4 (four) hours as needed.   pantoprazole (PROTONIX) 40 MG tablet Take 40 mg by mouth  daily.   Potassium Chloride ER 20 MEQ TBCR Take 1 tablet by mouth daily.   REPATHA SURECLICK 140 MG/ML SOAJ Inject 1 mL into the skin every 14 (fourteen) days.   rosuvastatin (CRESTOR) 20 MG tablet TAKE ONE TABLET BY MOUTH ONCE DAILY   Torsemide 40 MG TABS Take 40 mg by mouth daily.   [DISCONTINUED] potassium chloride SA (KLOR-CON M) 20 MEQ tablet Take 20 mEq by mouth daily.     Allergies:   Gabapentin   Social History   Socioeconomic History   Marital status: Widowed    Spouse name: Not on file   Number of children: 1   Years of education: Not on file   Highest education level: Not on file  Occupational History   Occupation: Retired  Tobacco Use   Smoking status: Former    Current packs/day: 0.00    Types: Cigarettes    Quit date: 06/06/1994    Years since quitting: 29.2   Smokeless tobacco: Never  Vaping Use   Vaping status: Never Used  Substance and Sexual Activity   Alcohol use: No   Drug use: No   Sexual activity: Yes  Other Topics Concern   Not on file  Social History Narrative   Not on file   Social Drivers of Health   Financial Resource Strain: Not on file  Food Insecurity: No Food Insecurity (02/28/2022)   Hunger Vital Sign    Worried About Running Out of Food in the Last Year: Never true    Ran Out of Food in the Last Year: Never true  Transportation Needs: No Transportation Needs (02/28/2022)   PRAPARE - Administrator, Civil Service (Medical): No    Lack of Transportation (Non-Medical): No  Physical Activity: Not on file  Stress: Not on file  Social Connections: Not on file     Family History: The patient's family history includes Cancer in his sister; Heart attack in his mother; Heart attack (age of onset: 45) in  his brother. There is no history of Colon cancer, Stomach cancer, Rectal cancer, or Esophageal cancer.  ROS:   Please see the history of present illness.     All other systems reviewed and are negative.  EKGs/Labs/Other Studies  Reviewed:    The following studies were reviewed today:   EKG:   EKG Interpretation Date/Time:  Tuesday August 22 2023 09:22:24 EDT Ventricular Rate:  65 PR Interval:  174 QRS Duration:  122 QT Interval:  430 QTC Calculation: 447 R Axis:   -64  Text Interpretation: Normal sinus rhythm Right bundle branch block Left anterior fascicular block Bifascicular block When compared with ECG of 28-Mar-2023 10:42, Questionable change in initial forces of Septal leads Nonspecific T wave abnormality, improved in Lateral leads Confirmed by Kristeen Miss ([id number]) on 08/22/2023 9:27:57 AM       Recent Labs: 03/28/2023: Hemoglobin 16.3; Platelets 227; TSH 2.020  Recent Lipid Panel    Component Value Date/Time   CHOL 195 12/21/2021 1059   TRIG 119 12/21/2021 1059   HDL 56 12/21/2021 1059   CHOLHDL 3.5 12/21/2021 1059   CHOLHDL 4 06/23/2009 1147   VLDL 23.4 06/23/2009 1147   LDLCALC 118 (H) 12/21/2021 1059   LDLDIRECT 159.1 12/10/2007 0851     Risk Assessment/Calculations:          STOP-Bang Score:  5       Physical Exam:    Physical Exam: Blood pressure 120/88, pulse 64, height 5\' 9"  (1.753 m), weight 221 lb 9.6 oz (100.5 kg), SpO2 95%.       GEN:  Well nourished, well developed in no acute distress HEENT: Normal NECK: No JVD; No carotid bruits LYMPHATICS: No lymphadenopathy CARDIAC: RRR , no murmurs, rubs, gallops RESPIRATORY:  chronic sounding left basilar rales  ABDOMEN: Soft, non-tender, non-distended MUSCULOSKELETAL:  No edema; No deformity  SKIN: Warm and dry NEUROLOGIC:  Alert and oriented x 3     ASSESSMENT:    1. Coronary artery disease involving native coronary artery of native heart without angina pectoris   2. Primary hypertension   3. Acute on chronic combined systolic and diastolic CHF (congestive heart failure) (HCC)   4. Chronic diastolic CHF (congestive heart failure) (HCC)   5. Mixed hyperlipidemia   6. Fatigue, unspecified type        PLAN:        Acute on chronic diastolic congestive heart failure: .  He appears stable    2.  Mild chronic kidney disease:  per primary MD    3.  CAD : Status post stenting in fall 2023.  Is not having any episodes of angina.  4.  Generalized fatigue: His medical doctor is treating him for depression.  He may also have sleep apnea.  Will screen him for a home sleep apnea test.      Medication Adjustments/Labs and Tests Ordered: Current medicines are reviewed at length with the patient today.  Concerns regarding medicines are outlined above.  Orders Placed This Encounter  Procedures   EKG 12-Lead   Itamar Sleep Study   No orders of the defined types were placed in this encounter.    Patient Instructions  Medication Instructions:  Your physician recommends that you continue on your current medications as directed. Please refer to the Current Medication list given to you today.  *If you need a refill on your cardiac medications before your next appointment, please call your pharmacy*  Testing/Procedures: Your physician has recommended that you have a home sleep  study. This test records several body functions during sleep, including: brain activity, eye movement, oxygen and carbon dioxide blood levels, heart rate and rhythm, breathing rate and rhythm, the flow of air through your mouth and nose, snoring, body muscle movements, and chest and belly movement.   Follow-Up: At [facility], you and your health needs are our priority.  As part of our continuing mission to provide you with exceptional heart care, we have created designated Provider Care Teams.  These Care Teams include your primary Cardiologist (physician) and Advanced Practice Providers (APPs -  Physician Assistants and Nurse Practitioners) who all work together to provide you with the care you need, when you need it.  Your next appointment:   1 year(s)  The format for your next appointment:   In Person  Provider:    Kristeen Miss, MD {  Other Instructions   1st Floor: - Lobby - Registration  - Pharmacy  - Lab - Cafe  2nd Floor: - PV Lab - Diagnostic Testing (echo, CT, nuclear med)  3rd Floor: - Vacant  4th Floor: - TCTS (cardiothoracic surgery) - AFib Clinic - Structural Heart Clinic - Vascular Surgery  - Vascular Ultrasound  5th Floor: - HeartCare Cardiology (general and EP) - Clinical Pharmacy for coumadin, hypertension, lipid, weight-loss medications, and med management appointments    Valet parking services will be available as well.      Signed, Kristeen Miss, MD  08/22/2023 10:03 AM    [facility]

## 2023-08-22 NOTE — Patient Instructions (Signed)
 Medication Instructions:  Your physician recommends that you continue on your current medications as directed. Please refer to the Current Medication list given to you today.  *If you need a refill on your cardiac medications before your next appointment, please call your pharmacy*  Testing/Procedures: Your physician has recommended that you have a home sleep study. This test records several body functions during sleep, including: brain activity, eye movement, oxygen and carbon dioxide blood levels, heart rate and rhythm, breathing rate and rhythm, the flow of air through your mouth and nose, snoring, body muscle movements, and chest and belly movement.   Follow-Up: At [facility], you and your health needs are our priority.  As part of our continuing mission to provide you with exceptional heart care, we have created designated Provider Care Teams.  These Care Teams include your primary Cardiologist (physician) and Advanced Practice Providers (APPs -  Physician Assistants and Nurse Practitioners) who all work together to provide you with the care you need, when you need it.  Your next appointment:   1 year(s)  The format for your next appointment:   In Person  Provider:   Kristeen Miss, MD {  Other Instructions   1st Floor: - Lobby - Registration  - Pharmacy  - Lab - Cafe  2nd Floor: - PV Lab - Diagnostic Testing (echo, CT, nuclear med)  3rd Floor: - Vacant  4th Floor: - TCTS (cardiothoracic surgery) - AFib Clinic - Structural Heart Clinic - Vascular Surgery  - Vascular Ultrasound  5th Floor: - HeartCare Cardiology (general and EP) - Clinical Pharmacy for coumadin, hypertension, lipid, weight-loss medications, and med management appointments    Valet parking services will be available as well.

## 2023-08-23 ENCOUNTER — Telehealth: Payer: Self-pay | Admitting: *Deleted

## 2023-08-23 NOTE — Telephone Encounter (Signed)
 I s/w the pt about setting up Itamar study. Pt tells me that he is going to hold off for now. I assured the pt that I will update Dr. Elease Hashimoto and his nurse. I stated to the pt that if he decides to proceed he can call our sleep team to help set up Itamar study. Pt thanked me for the call.

## 2023-08-23 NOTE — Telephone Encounter (Signed)
-----   Message from Nurse Estelle Grumbles sent at 08/22/2023  9:49 AM EDT ----- Regarding: Itamar Setup 08/23/23 11a-12pm Hello,  Dr. Elease Hashimoto ordered an Itamar sleep study for this patient. He has a smartphone, but did not bring to appt with him. He requested to come back tomorrow (Wednesday 3/19) between 11am and 12pm to get setup with the Itamar.  STOP BANG score: 5 Height: 5'9" Weight: 221 lbs

## 2023-08-24 ENCOUNTER — Other Ambulatory Visit: Payer: Self-pay | Admitting: Cardiovascular Disease

## 2023-08-24 DIAGNOSIS — M5416 Radiculopathy, lumbar region: Secondary | ICD-10-CM | POA: Diagnosis not present

## 2023-08-24 DIAGNOSIS — M25551 Pain in right hip: Secondary | ICD-10-CM | POA: Diagnosis not present

## 2023-08-24 DIAGNOSIS — M545 Low back pain, unspecified: Secondary | ICD-10-CM | POA: Diagnosis not present

## 2023-08-24 DIAGNOSIS — M47896 Other spondylosis, lumbar region: Secondary | ICD-10-CM | POA: Diagnosis not present

## 2023-09-01 DIAGNOSIS — M79674 Pain in right toe(s): Secondary | ICD-10-CM | POA: Diagnosis not present

## 2023-09-05 DIAGNOSIS — F323 Major depressive disorder, single episode, severe with psychotic features: Secondary | ICD-10-CM | POA: Diagnosis not present

## 2023-09-06 ENCOUNTER — Ambulatory Visit (INDEPENDENT_AMBULATORY_CARE_PROVIDER_SITE_OTHER): Admitting: Podiatry

## 2023-09-06 DIAGNOSIS — B999 Unspecified infectious disease: Secondary | ICD-10-CM | POA: Diagnosis not present

## 2023-09-06 MED ORDER — TERBINAFINE HCL 250 MG PO TABS: 250.0000 mg | ORAL_TABLET | Freq: Every day | ORAL | 0 refills | Status: AC

## 2023-09-06 MED ORDER — DOXYCYCLINE HYCLATE 100 MG PO TABS: 100.0000 mg | ORAL_TABLET | Freq: Two times a day (BID) | ORAL | 0 refills | Status: AC

## 2023-09-06 NOTE — Progress Notes (Signed)
 Subjective:  Patient ID: [name], male    DOB: April 21, 1941,  MRN: [id number]  Chief Complaint  Patient presents with   Toe Pain    Right foot 5th toe pain. Place in between the toe causing him pain     83 y.o. male presents with the above complaint.  Patient presents with right fourth interdigital space maceration/skin breakdown with underlying superinfection.  She states been present for quite some time is progressive gotten worse worse with ambulation with pressure patient would like to discuss treatment options for this.  He has not seen anyone as prior to seeing me for this.  Pain scale is 5 out of 10 dull achy in nature   Review of Systems: Negative except as noted in the HPI. Denies N/V/F/Ch.  Past Medical History:  Diagnosis Date   Adenomatous colon polyp    Arthritis    CAD (coronary artery disease)    a. MI in 1996 with stent placement b. cath 01/18/17 - S/p PCI of in-stent restenosis of pLAD with cutting ballon & DES; 20% ostial LAD; 40% pro Cx; 60% focal pRCA   Clostridium difficile infection    Depression    Diverticulosis    H. pylori infection    HTN (hypertension)    Hyperlipidemia    MI (myocardial infarction) (HCC)     Current Outpatient Medications:    doxycycline (VIBRA-TABS) 100 MG tablet, Take 1 tablet (100 mg total) by mouth 2 (two) times daily. (Patient taking differently: Take 100 mg by mouth daily.), Disp: 20 tablet, Rfl: 0   terbinafine (LAMISIL) 250 MG tablet, Take 1 tablet (250 mg total) by mouth daily., Disp: 30 tablet, Rfl: 0   acetaminophen (TYLENOL) 500 MG tablet, Take 2 tablets (1,000 mg total) by mouth every 6 (six) hours., Disp: 30 tablet, Rfl: 0   celecoxib (CELEBREX) 100 MG capsule, Take 100 mg by mouth daily., Disp: , Rfl:    clobetasol (TEMOVATE) 0.05 % external solution, Apply 1 Application topically daily., Disp: , Rfl:    clopidogrel (PLAVIX) 75 MG tablet, TAKE 1 TABLET BY MOUTH ONCE DAILY, Disp: 30 tablet, Rfl: 9   docusate sodium  (COLACE) 100 MG capsule, Take 1 capsule (100 mg total) by mouth 2 (two) times daily., Disp: 10 capsule, Rfl: 0   escitalopram (LEXAPRO) 20 MG tablet, Take 20 mg by mouth daily., Disp: , Rfl:    fenofibrate (TRICOR) 145 MG tablet, TAKE ONE TABLET BY MOUTH ONCE DAILY, Disp: 90 tablet, Rfl: 2   isosorbide mononitrate (IMDUR) 30 MG 24 hr tablet, TAKE 1 & 1/2 TABLETS BY MOUTH ONCE DAILY, Disp: 135 tablet, Rfl: 3   ketoconazole (NIZORAL) 2 % cream, Apply 1 Application topically daily., Disp: , Rfl:    lidocaine (LIDODERM) 5 %, Place 1 patch onto the skin daily for 7 days. Remove & Discard patch within 12 hours or as directed by MD, Disp: 7 patch, Rfl: 0   losartan (COZAAR) 25 MG tablet, Take 1 tablet (25 mg total) by mouth daily., Disp: 30 tablet, Rfl: 0   methocarbamol (ROBAXIN) 500 MG tablet, Take 1 tablet (500 mg total) by mouth every 8 (eight) hours as needed for muscle spasms (pain after rib fracture)., Disp: 30 tablet, Rfl: 0   nitroGLYCERIN (NITROSTAT) 0.4 MG SL tablet, PLACE 1 TABLET UNDER THE TONGUE EVERY 5 MINTUES AS NEEDED FOR CHEST PAIN, Disp: 25 tablet, Rfl: 11   oxyCODONE-acetaminophen (PERCOCET) 5-325 MG tablet, Take 1 tablet by mouth every other day., Disp: , Rfl:  pantoprazole (PROTONIX) 40 MG tablet, Take 40 mg by mouth daily., Disp: , Rfl:    polyethylene glycol (MIRALAX / GLYCOLAX) 17 g packet, Take 17 g by mouth daily as needed (constipation)., Disp: , Rfl:    Potassium Chloride ER 20 MEQ TBCR, Take 1 tablet by mouth daily., Disp: , Rfl:    REPATHA SURECLICK 140 MG/ML SOAJ, Inject 1 mL into the skin every 14 (fourteen) days., Disp: , Rfl:    rosuvastatin (CRESTOR) 20 MG tablet, TAKE ONE TABLET BY MOUTH ONCE DAILY (Patient not taking: Reported on 09/12/2023), Disp: 90 tablet, Rfl: 0   sertraline (ZOLOFT) 50 MG tablet, Take 50 mg by mouth daily., Disp: , Rfl:    Torsemide 40 MG TABS, Take 40 mg by mouth daily., Disp: 30 tablet, Rfl: 9   Vilazodone HCl 20 MG TABS, 1 tablet with food  Orally Once a day for 30 days, Disp: , Rfl:   Social History   Tobacco Use  Smoking Status Former   Current packs/day: 0.00   Types: Cigarettes   Quit date: 06/06/1994   Years since quitting: 29.3  Smokeless Tobacco Never    Allergies  Allergen Reactions   Gabapentin Other (See Comments)    Patient said while taking this medication it caused nightmares and loss of sleep   Objective:  There were no vitals filed for this visit. There is no height or weight on file to calculate BMI. Constitutional Well developed. Well nourished.  Vascular Dorsalis pedis pulses palpable bilaterally. Posterior tibial pulses palpable bilaterally. Capillary refill normal to all digits.  No cyanosis or clubbing noted. Pedal hair growth normal.  Neurologic Normal speech. Oriented to person, place, and time. Epicritic sensation to light touch grossly present bilaterally.  Dermatologic Nails right fourth interdigital space maceration/superinfection.  No open wounds or lesion noted no signs of infection noted..  N Skin within normal limits  Orthopedic: Normal joint ROM without pain or crepitus bilaterally. No visible deformities. No bony tenderness.   Radiographs: None Assessment:  No diagnosis found. Plan:  Patient was evaluated and treated and all questions answered.  Right fourth interspace superinfection - All questions and concerns were discussed with the patient in extensive detail given the amount of superinfection that is present patient will benefit from Lotrisone cream to be applied twice a day and encouraged him to take Lamisil medication as well as Doxy cyclin medication every 10 days.  He states understanding would like to continue doing that.  No follow-ups on file.  Right fourth interdigital space maceration/superifnection Lamisil for 30 days doxycycline for 10 days Betadine wet-to-dry

## 2023-09-11 ENCOUNTER — Encounter (HOSPITAL_COMMUNITY): Payer: Self-pay | Admitting: Radiology

## 2023-09-11 ENCOUNTER — Emergency Department (HOSPITAL_COMMUNITY)

## 2023-09-11 ENCOUNTER — Other Ambulatory Visit: Payer: Self-pay

## 2023-09-11 ENCOUNTER — Observation Stay (HOSPITAL_COMMUNITY)
Admission: EM | Admit: 2023-09-11 | Discharge: 2023-09-12 | Disposition: A | Discharge status: Home / Self Care | Attending: General Surgery | Admitting: General Surgery

## 2023-09-11 DIAGNOSIS — Z79899 Other long term (current) drug therapy: Secondary | ICD-10-CM | POA: Insufficient documentation

## 2023-09-11 DIAGNOSIS — Z955 Presence of coronary angioplasty implant and graft: Secondary | ICD-10-CM

## 2023-09-11 DIAGNOSIS — I25118 Atherosclerotic heart disease of native coronary artery with other forms of angina pectoris: Secondary | ICD-10-CM | POA: Diagnosis not present

## 2023-09-11 DIAGNOSIS — I10 Essential (primary) hypertension: Secondary | ICD-10-CM | POA: Diagnosis present

## 2023-09-11 DIAGNOSIS — M069 Rheumatoid arthritis, unspecified: Secondary | ICD-10-CM | POA: Insufficient documentation

## 2023-09-11 DIAGNOSIS — F32A Depression, unspecified: Secondary | ICD-10-CM | POA: Insufficient documentation

## 2023-09-11 DIAGNOSIS — K5900 Constipation, unspecified: Secondary | ICD-10-CM | POA: Diagnosis not present

## 2023-09-11 DIAGNOSIS — M25512 Pain in left shoulder: Secondary | ICD-10-CM | POA: Diagnosis not present

## 2023-09-11 DIAGNOSIS — Z7982 Long term (current) use of aspirin: Secondary | ICD-10-CM | POA: Insufficient documentation

## 2023-09-11 DIAGNOSIS — S2249XA Multiple fractures of ribs, unspecified side, initial encounter for closed fracture: Secondary | ICD-10-CM | POA: Diagnosis not present

## 2023-09-11 DIAGNOSIS — Z7902 Long term (current) use of antithrombotics/antiplatelets: Secondary | ICD-10-CM | POA: Insufficient documentation

## 2023-09-11 DIAGNOSIS — I110 Hypertensive heart disease with heart failure: Secondary | ICD-10-CM | POA: Insufficient documentation

## 2023-09-11 DIAGNOSIS — S0990XA Unspecified injury of head, initial encounter: Secondary | ICD-10-CM | POA: Diagnosis not present

## 2023-09-11 DIAGNOSIS — M19012 Primary osteoarthritis, left shoulder: Secondary | ICD-10-CM | POA: Diagnosis not present

## 2023-09-11 DIAGNOSIS — E785 Hyperlipidemia, unspecified: Secondary | ICD-10-CM | POA: Insufficient documentation

## 2023-09-11 DIAGNOSIS — Z96653 Presence of artificial knee joint, bilateral: Secondary | ICD-10-CM | POA: Insufficient documentation

## 2023-09-11 DIAGNOSIS — S2239XA Fracture of one rib, unspecified side, initial encounter for closed fracture: Secondary | ICD-10-CM | POA: Diagnosis present

## 2023-09-11 DIAGNOSIS — K260 Acute duodenal ulcer with hemorrhage: Secondary | ICD-10-CM | POA: Diagnosis not present

## 2023-09-11 DIAGNOSIS — K920 Hematemesis: Secondary | ICD-10-CM | POA: Diagnosis not present

## 2023-09-11 DIAGNOSIS — R42 Dizziness and giddiness: Secondary | ICD-10-CM | POA: Insufficient documentation

## 2023-09-11 DIAGNOSIS — S20212A Contusion of left front wall of thorax, initial encounter: Secondary | ICD-10-CM | POA: Diagnosis not present

## 2023-09-11 DIAGNOSIS — R531 Weakness: Secondary | ICD-10-CM | POA: Insufficient documentation

## 2023-09-11 DIAGNOSIS — R7989 Other specified abnormal findings of blood chemistry: Secondary | ICD-10-CM | POA: Diagnosis not present

## 2023-09-11 DIAGNOSIS — Z87891 Personal history of nicotine dependence: Secondary | ICD-10-CM | POA: Insufficient documentation

## 2023-09-11 DIAGNOSIS — R0609 Other forms of dyspnea: Secondary | ICD-10-CM | POA: Diagnosis not present

## 2023-09-11 DIAGNOSIS — S2232XA Fracture of one rib, left side, initial encounter for closed fracture: Secondary | ICD-10-CM

## 2023-09-11 DIAGNOSIS — R0989 Other specified symptoms and signs involving the circulatory and respiratory systems: Secondary | ICD-10-CM | POA: Diagnosis not present

## 2023-09-11 DIAGNOSIS — D689 Coagulation defect, unspecified: Secondary | ICD-10-CM | POA: Insufficient documentation

## 2023-09-11 DIAGNOSIS — R001 Bradycardia, unspecified: Secondary | ICD-10-CM | POA: Insufficient documentation

## 2023-09-11 DIAGNOSIS — S2691XA Contusion of heart, unspecified with or without hemopericardium, initial encounter: Secondary | ICD-10-CM | POA: Insufficient documentation

## 2023-09-11 DIAGNOSIS — S299XXA Unspecified injury of thorax, initial encounter: Secondary | ICD-10-CM | POA: Diagnosis present

## 2023-09-11 DIAGNOSIS — I5043 Acute on chronic combined systolic (congestive) and diastolic (congestive) heart failure: Secondary | ICD-10-CM | POA: Diagnosis not present

## 2023-09-11 DIAGNOSIS — I252 Old myocardial infarction: Secondary | ICD-10-CM | POA: Insufficient documentation

## 2023-09-11 DIAGNOSIS — G9389 Other specified disorders of brain: Secondary | ICD-10-CM | POA: Diagnosis not present

## 2023-09-11 DIAGNOSIS — R079 Chest pain, unspecified: Secondary | ICD-10-CM | POA: Diagnosis not present

## 2023-09-11 DIAGNOSIS — R519 Headache, unspecified: Secondary | ICD-10-CM | POA: Diagnosis not present

## 2023-09-11 DIAGNOSIS — S2242XA Multiple fractures of ribs, left side, initial encounter for closed fracture: Secondary | ICD-10-CM

## 2023-09-11 DIAGNOSIS — D496 Neoplasm of unspecified behavior of brain: Secondary | ICD-10-CM | POA: Diagnosis not present

## 2023-09-11 DIAGNOSIS — I5032 Chronic diastolic (congestive) heart failure: Secondary | ICD-10-CM

## 2023-09-11 LAB — BASIC METABOLIC PANEL WITH GFR
Anion gap: 11 (ref 5–15)
BUN: 15 mg/dL (ref 8–23)
CO2: 27 mmol/L (ref 22–32)
Calcium: 9.4 mg/dL (ref 8.9–10.3)
Chloride: 101 mmol/L (ref 98–111)
Creatinine, Ser: 0.89 mg/dL (ref 0.61–1.24)
GFR, Estimated: >60 mL/min (ref >60)
Glucose, Bld: 203 mg/dL — ABNORMAL HIGH (ref 70–99)
Potassium: 4.2 mmol/L (ref 3.5–5.1)
Sodium: 139 mmol/L (ref 135–145)

## 2023-09-11 LAB — CBC
HCT: 50.9 % (ref 39.0–52.0)
Hemoglobin: 16 g/dL (ref 13.0–17.0)
MCH: 30.1 pg (ref 26.0–34.0)
MCHC: 31.4 g/dL (ref 30.0–36.0)
MCV: 95.7 fL (ref 80.0–100.0)
Platelets: 213 10*3/uL (ref 150–400)
RBC: 5.32 MIL/uL (ref 4.22–5.81)
RDW: 15.1 % (ref 11.5–15.5)
WBC: 6.8 10*3/uL (ref 4.0–10.5)
nRBC: 0 % (ref 0.0–0.2)

## 2023-09-11 LAB — TROPONIN I (HIGH SENSITIVITY)
Troponin I (High Sensitivity): 76 ng/L — ABNORMAL HIGH (ref <18)
Troponin I (High Sensitivity): 88 ng/L — ABNORMAL HIGH (ref <18)

## 2023-09-11 LAB — BRAIN NATRIURETIC PEPTIDE: B Natriuretic Peptide: 52.7 pg/mL (ref 0.0–100.0)

## 2023-09-11 MED ORDER — ASPIRIN 81 MG PO TBEC
81.0000 mg | DELAYED_RELEASE_TABLET | Freq: Every day | ORAL | Status: DC
  Administered 2023-09-12: 81 mg via ORAL
  Filled 2023-09-11: qty 1

## 2023-09-11 MED ORDER — METOPROLOL TARTRATE 5 MG/5ML IV SOLN: 5.0000 mg | Freq: Four times a day (QID) | INTRAVENOUS | Status: DC | PRN

## 2023-09-11 MED ORDER — CLOPIDOGREL BISULFATE 75 MG PO TABS: 75.0000 mg | ORAL_TABLET | Freq: Every day | ORAL | Status: DC

## 2023-09-11 MED ORDER — ACETAMINOPHEN 500 MG PO TABS
1000.0000 mg | ORAL_TABLET | Freq: Once | ORAL | Status: AC
  Administered 2023-09-11: 1000 mg via ORAL
  Filled 2023-09-11: qty 2

## 2023-09-11 MED ORDER — ROSUVASTATIN CALCIUM 20 MG PO TABS
20.0000 mg | ORAL_TABLET | Freq: Every day | ORAL | Status: DC
  Administered 2023-09-12: 20 mg via ORAL
  Filled 2023-09-11: qty 1

## 2023-09-11 MED ORDER — METHOCARBAMOL 1000 MG/10ML IJ SOLN: 500.0000 mg | Freq: Three times a day (TID) | INTRAMUSCULAR | Status: DC

## 2023-09-11 MED ORDER — NITROGLYCERIN 0.4 MG SL SUBL: 0.4000 mg | SUBLINGUAL_TABLET | SUBLINGUAL | Status: DC | PRN

## 2023-09-11 MED ORDER — ACETAMINOPHEN 500 MG PO TABS
1000.0000 mg | ORAL_TABLET | Freq: Four times a day (QID) | ORAL | Status: DC
  Administered 2023-09-11 – 2023-09-12 (×3): 1000 mg via ORAL
  Filled 2023-09-11 (×3): qty 2

## 2023-09-11 MED ORDER — SODIUM CHLORIDE 0.9 % IV SOLN: INTRAVENOUS | Status: DC

## 2023-09-11 MED ORDER — MORPHINE SULFATE (PF) 2 MG/ML IV SOLN: 1.0000 mg | INTRAVENOUS | Status: DC | PRN

## 2023-09-11 MED ORDER — ONDANSETRON 4 MG PO TBDP: 4.0000 mg | ORAL_TABLET | Freq: Four times a day (QID) | ORAL | Status: DC | PRN

## 2023-09-11 MED ORDER — POLYETHYLENE GLYCOL 3350 17 G PO PACK: 17.0000 g | PACK | Freq: Every day | ORAL | Status: DC | PRN

## 2023-09-11 MED ORDER — OXYCODONE-ACETAMINOPHEN 5-325 MG PO TABS
1.0000 | ORAL_TABLET | ORAL | Status: DC | PRN
Start: 2023-09-11 — End: 2023-09-12

## 2023-09-11 MED ORDER — TORSEMIDE 20 MG PO TABS
40.0000 mg | ORAL_TABLET | Freq: Every day | ORAL | Status: DC
  Administered 2023-09-12: 40 mg via ORAL
  Filled 2023-09-11: qty 2

## 2023-09-11 MED ORDER — ONDANSETRON HCL 4 MG/2ML IJ SOLN: 4.0000 mg | Freq: Four times a day (QID) | INTRAMUSCULAR | Status: DC | PRN

## 2023-09-11 MED ORDER — DOCUSATE SODIUM 100 MG PO CAPS
100.0000 mg | ORAL_CAPSULE | Freq: Two times a day (BID) | ORAL | Status: DC
  Administered 2023-09-12: 100 mg via ORAL
  Filled 2023-09-11: qty 1

## 2023-09-11 MED ORDER — LIDOCAINE 5 % EX PTCH
1.0000 | MEDICATED_PATCH | Freq: Once | CUTANEOUS | Status: AC
  Administered 2023-09-11: 1 via TRANSDERMAL
  Filled 2023-09-11: qty 1

## 2023-09-11 MED ORDER — METHOCARBAMOL 500 MG PO TABS
500.0000 mg | ORAL_TABLET | Freq: Three times a day (TID) | ORAL | Status: DC
  Administered 2023-09-11 – 2023-09-12 (×3): 500 mg via ORAL
  Filled 2023-09-11 (×3): qty 1

## 2023-09-11 MED ORDER — ENOXAPARIN SODIUM 30 MG/0.3ML IJ SOSY: 30.0000 mg | PREFILLED_SYRINGE | Freq: Two times a day (BID) | INTRAMUSCULAR | Status: DC

## 2023-09-11 MED ORDER — PANTOPRAZOLE SODIUM 40 MG PO TBEC
40.0000 mg | DELAYED_RELEASE_TABLET | Freq: Every day | ORAL | Status: DC
  Administered 2023-09-12: 40 mg via ORAL
  Filled 2023-09-11: qty 1

## 2023-09-11 MED ORDER — HYDROMORPHONE HCL 1 MG/ML IJ SOLN
0.5000 mg | Freq: Once | INTRAMUSCULAR | Status: AC
  Administered 2023-09-11: 0.5 mg via INTRAVENOUS
  Filled 2023-09-11: qty 1

## 2023-09-11 MED ORDER — ONDANSETRON HCL 4 MG/2ML IJ SOLN
4.0000 mg | Freq: Once | INTRAMUSCULAR | Status: AC
  Administered 2023-09-11: 4 mg via INTRAVENOUS
  Filled 2023-09-11: qty 2

## 2023-09-11 MED ORDER — FENOFIBRATE 160 MG PO TABS
160.0000 mg | ORAL_TABLET | Freq: Every day | ORAL | Status: DC
  Administered 2023-09-12: 160 mg via ORAL
  Filled 2023-09-11: qty 1

## 2023-09-11 MED ORDER — ESCITALOPRAM OXALATE 20 MG PO TABS
20.0000 mg | ORAL_TABLET | Freq: Every day | ORAL | Status: DC
Start: 2023-09-12 — End: 2023-09-12
  Administered 2023-09-12: 20 mg via ORAL
  Filled 2023-09-11: qty 1

## 2023-09-11 MED ORDER — HYDRALAZINE HCL 20 MG/ML IJ SOLN: 10.0000 mg | INTRAMUSCULAR | Status: DC | PRN
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
## 2023-09-11 NOTE — ED Notes (Signed)
 Report given to Ramita, RN

## 2023-09-11 NOTE — ED Notes (Signed)
 Ice pack given
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
## 2023-09-11 NOTE — ED Notes (Signed)
[facility] contacted for pt transport. 
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
## 2023-09-12 ENCOUNTER — Observation Stay (HOSPITAL_COMMUNITY)

## 2023-09-12 ENCOUNTER — Encounter (HOSPITAL_COMMUNITY): Payer: Self-pay

## 2023-09-12 ENCOUNTER — Inpatient Hospital Stay (HOSPITAL_BASED_OUTPATIENT_CLINIC_OR_DEPARTMENT_OTHER)

## 2023-09-12 ENCOUNTER — Other Ambulatory Visit (HOSPITAL_COMMUNITY): Payer: Self-pay

## 2023-09-12 DIAGNOSIS — I2510 Atherosclerotic heart disease of native coronary artery without angina pectoris: Secondary | ICD-10-CM

## 2023-09-12 DIAGNOSIS — K760 Fatty (change of) liver, not elsewhere classified: Secondary | ICD-10-CM | POA: Diagnosis not present

## 2023-09-12 DIAGNOSIS — R7989 Other specified abnormal findings of blood chemistry: Secondary | ICD-10-CM | POA: Diagnosis not present

## 2023-09-12 DIAGNOSIS — I10 Essential (primary) hypertension: Secondary | ICD-10-CM

## 2023-09-12 DIAGNOSIS — S2249XA Multiple fractures of ribs, unspecified side, initial encounter for closed fracture: Secondary | ICD-10-CM | POA: Diagnosis not present

## 2023-09-12 DIAGNOSIS — I700 Atherosclerosis of aorta: Secondary | ICD-10-CM | POA: Diagnosis not present

## 2023-09-12 DIAGNOSIS — I5032 Chronic diastolic (congestive) heart failure: Secondary | ICD-10-CM

## 2023-09-12 DIAGNOSIS — S2232XA Fracture of one rib, left side, initial encounter for closed fracture: Secondary | ICD-10-CM | POA: Diagnosis not present

## 2023-09-12 DIAGNOSIS — R079 Chest pain, unspecified: Secondary | ICD-10-CM | POA: Diagnosis not present

## 2023-09-12 DIAGNOSIS — S2239XA Fracture of one rib, unspecified side, initial encounter for closed fracture: Secondary | ICD-10-CM | POA: Diagnosis present

## 2023-09-12 DIAGNOSIS — R0609 Other forms of dyspnea: Secondary | ICD-10-CM

## 2023-09-12 DIAGNOSIS — E7800 Pure hypercholesterolemia, unspecified: Secondary | ICD-10-CM

## 2023-09-12 DIAGNOSIS — Z955 Presence of coronary angioplasty implant and graft: Secondary | ICD-10-CM

## 2023-09-12 DIAGNOSIS — R072 Precordial pain: Secondary | ICD-10-CM

## 2023-09-12 LAB — CBC
HCT: 45 % (ref 39.0–52.0)
Hemoglobin: 14.9 g/dL (ref 13.0–17.0)
MCH: 30.9 pg (ref 26.0–34.0)
MCHC: 33.1 g/dL (ref 30.0–36.0)
MCV: 93.4 fL (ref 80.0–100.0)
Platelets: 191 10*3/uL (ref 150–400)
RBC: 4.82 MIL/uL (ref 4.22–5.81)
RDW: 14.9 % (ref 11.5–15.5)
WBC: 6.8 10*3/uL (ref 4.0–10.5)
nRBC: 0 % (ref 0.0–0.2)

## 2023-09-12 LAB — BASIC METABOLIC PANEL WITH GFR
Anion gap: 10 (ref 5–15)
BUN: 13 mg/dL (ref 8–23)
CO2: 30 mmol/L (ref 22–32)
Calcium: 9.3 mg/dL (ref 8.9–10.3)
Chloride: 99 mmol/L (ref 98–111)
Creatinine, Ser: 1 mg/dL (ref 0.61–1.24)
GFR, Estimated: >60 mL/min (ref >60)
Glucose, Bld: 184 mg/dL — ABNORMAL HIGH (ref 70–99)
Potassium: 4.3 mmol/L (ref 3.5–5.1)
Sodium: 139 mmol/L (ref 135–145)

## 2023-09-12 LAB — TROPONIN I (HIGH SENSITIVITY): Troponin I (High Sensitivity): 91 ng/L — ABNORMAL HIGH (ref <18)

## 2023-09-12 LAB — ECHOCARDIOGRAM COMPLETE
AR max vel: 3.3 cm2
AV Area VTI: 2.94 cm2
AV Area mean vel: 3.01 cm2
AV Mean grad: 2 mmHg
AV Peak grad: 3.5 mmHg
Ao pk vel: 0.94 m/s
Area-P 1/2: 3.37 cm2
Height: 69 in
S' Lateral: 3.6 cm
Weight: 3360 [oz_av]

## 2023-09-12 LAB — BRAIN NATRIURETIC PEPTIDE: B Natriuretic Peptide: 78.9 pg/mL (ref 0.0–100.0)

## 2023-09-12 LAB — D-DIMER, QUANTITATIVE: D-Dimer, Quant: 0.86 ug{FEU}/mL — ABNORMAL HIGH (ref 0.00–0.50)

## 2023-09-12 MED ORDER — TRAMADOL HCL 50 MG PO TABS: 50.0000 mg | ORAL_TABLET | Freq: Four times a day (QID) | ORAL | Status: DC | PRN

## 2023-09-12 MED ORDER — IOHEXOL 350 MG/ML SOLN
75.0000 mL | Freq: Once | INTRAVENOUS | Status: AC | PRN
  Administered 2023-09-12: 75 mL via INTRAVENOUS

## 2023-09-12 MED ORDER — METHOCARBAMOL 500 MG PO TABS
500.0000 mg | ORAL_TABLET | Freq: Three times a day (TID) | ORAL | 0 refills | Status: AC | PRN
  Filled 2023-09-12: qty 30, 10d supply, fill #0

## 2023-09-12 MED ORDER — OXYCODONE HCL 5 MG PO TABS: 5.0000 mg | ORAL_TABLET | ORAL | Status: DC | PRN

## 2023-09-12 MED ORDER — DOCUSATE SODIUM 100 MG PO CAPS
100.0000 mg | ORAL_CAPSULE | Freq: Two times a day (BID) | ORAL | 0 refills | Status: AC
  Filled 2023-09-12: qty 10, 5d supply, fill #0

## 2023-09-12 MED ORDER — PERFLUTREN LIPID MICROSPHERE
1.0000 mL | INTRAVENOUS | Status: AC | PRN
  Administered 2023-09-12: 3 mL via INTRAVENOUS

## 2023-09-12 MED ORDER — ACETAMINOPHEN 500 MG PO TABS
1000.0000 mg | ORAL_TABLET | Freq: Four times a day (QID) | ORAL | 0 refills | Status: AC
  Filled 2023-09-12: qty 30, 4d supply, fill #0

## 2023-09-12 MED ORDER — LIDOCAINE 5 % EX PTCH
1.0000 | MEDICATED_PATCH | CUTANEOUS | Status: DC
  Administered 2023-09-12: 1 via TRANSDERMAL
  Filled 2023-09-12: qty 1

## 2023-09-12 MED ORDER — LIDOCAINE 5 % EX PTCH
1.0000 | MEDICATED_PATCH | CUTANEOUS | 0 refills | Status: AC
  Filled 2023-09-12: qty 7, 7d supply, fill #0

## 2023-09-12 MED ORDER — LOSARTAN POTASSIUM 25 MG PO TABS
25.0000 mg | ORAL_TABLET | Freq: Every day | ORAL | Status: DC
  Administered 2023-09-12: 25 mg via ORAL
  Filled 2023-09-12: qty 1

## 2023-09-12 MED ORDER — LOSARTAN POTASSIUM 25 MG PO TABS
25.0000 mg | ORAL_TABLET | Freq: Every day | ORAL | 0 refills | Status: AC
  Filled 2023-09-12: qty 30, 30d supply, fill #0

## 2023-09-12 MED ORDER — POLYETHYLENE GLYCOL 3350 17 G PO PACK: 17.0000 g | PACK | Freq: Every day | ORAL | Status: AC | PRN

## 2023-09-12 NOTE — Care Management CC44 (Signed)
 Condition Code 44 Documentation Completed  Patient Details  Name: HARIM BI MRN: [id number] Date of Birth: [birth year]   Condition Code 44 given:  Yes Patient signature on Condition Code 44 notice:  Yes Documentation of 2 MD's agreement:  Yes Code 44 added to claim:  Yes    Kingsley Plan, RN 09/12/2023, 2:57 PM

## 2023-09-12 NOTE — Progress Notes (Signed)
 Given the fact that he gets short of breath within ambulating 10 feet and clinically not in congestive heart failure ordered a D-dimer for further evaluation.  D-dimer is above age corrected normal limits.  Will order CT PE study to rule out pulmonary embolism.  Timothy Mcclure [place], DO, [facility]
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
## 2023-09-12 NOTE — Care Management Obs Status (Signed)
 MEDICARE OBSERVATION STATUS NOTIFICATION   Patient Details  Name: [name] MRN: [id number] Date of Birth: 01-02-1941   Medicare Observation Status Notification Given:  Yes    Kingsley Plan, RN 09/12/2023, 2:53 PM
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
## 2023-09-12 NOTE — Progress Notes (Signed)
  Echocardiogram 2D Echocardiogram has been performed.  Reinaldo Raddle Emary Zalar 09/12/2023, 2:56 PM

## 2023-09-12 NOTE — Progress Notes (Signed)
 Patient has been discharged per MD order. IV removed and tolerated well, no complaints of pain at this time. Patient's TOC medications have been given to patient. Son will be picking him up, staff member will be taking the patient down by wheelchair to be discharged. Bed in lowest position, call light within reach.
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
## 2023-09-12 NOTE — Plan of Care (Signed)

## 2023-09-12 NOTE — Progress Notes (Signed)
 Wet read obtained from radiology, CTA negative for PE. Patient stable for discharge.
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
## 2023-09-13 ENCOUNTER — Encounter: Payer: Self-pay | Admitting: Surgery

## 2023-09-13 NOTE — Progress Notes (Signed)
 Final CTA read reviewed, negative for CT.

## 2023-09-28 ENCOUNTER — Ambulatory Visit: Admitting: Cardiovascular Disease

## 2023-10-05 ENCOUNTER — Ambulatory Visit: Payer: Medicare Other | Admitting: Podiatry

## 2023-10-07 DIAGNOSIS — G8929 Other chronic pain: Secondary | ICD-10-CM | POA: Diagnosis not present

## 2023-10-07 DIAGNOSIS — M5416 Radiculopathy, lumbar region: Secondary | ICD-10-CM | POA: Diagnosis not present

## 2023-10-09 DIAGNOSIS — M25512 Pain in left shoulder: Secondary | ICD-10-CM | POA: Diagnosis not present

## 2023-10-26 ENCOUNTER — Ambulatory Visit (INDEPENDENT_AMBULATORY_CARE_PROVIDER_SITE_OTHER): Admitting: Podiatry

## 2023-10-26 DIAGNOSIS — D689 Coagulation defect, unspecified: Secondary | ICD-10-CM

## 2023-10-26 DIAGNOSIS — B351 Tinea unguium: Secondary | ICD-10-CM

## 2023-10-26 DIAGNOSIS — M79609 Pain in unspecified limb: Secondary | ICD-10-CM

## 2023-10-26 NOTE — Progress Notes (Signed)
 This patient returns to my office for at risk foot care.  This patient requires this care by a professional since this patient will be at risk due to having coagulation defect.  Patient is taking plavix.  This patient is unable to cut nails himself since the patient cannot reach his nails.These nails are painful walking and wearing shoes.  This patient presents for at risk foot care today.  General Appearance  Alert, conversant and in no acute stress.  Vascular  Dorsalis pedis and posterior tibial  pulses are palpable  bilaterally.  Capillary return is within normal limits  bilaterally. Temperature is within normal limits  bilaterally.  Neurologic  Senn-Weinstein monofilament wire test within normal limits  bilaterally. Muscle power within normal limits bilaterally.  Nails Thick disfigured discolored nails with subungual debris  from hallux to fifth toes bilaterally. No evidence of bacterial infection or drainage bilaterally.  Orthopedic  No limitations of motion  feet .  No crepitus or effusions noted.  No bony pathology or digital deformities noted.  Skin  normotropic skin with no porokeratosis noted bilaterally.  No signs of infections or ulcers noted.     Onychomycosis  Pain in right toes  Pain in left toes  Consent was obtained for treatment procedures.   Mechanical debridement of nails 1-5  bilaterally performed with a nail nipper.  Filed with dremel without incident.    Return office visit    3   months                  Told patient to return for periodic foot care and evaluation due to potential at risk complications.   Helane Gunther DPM

## 2023-10-31 ENCOUNTER — Other Ambulatory Visit (HOSPITAL_COMMUNITY): Payer: Self-pay

## 2023-11-08 DIAGNOSIS — M25521 Pain in right elbow: Secondary | ICD-10-CM | POA: Diagnosis not present

## 2023-11-08 DIAGNOSIS — M19021 Primary osteoarthritis, right elbow: Secondary | ICD-10-CM | POA: Diagnosis not present

## 2023-11-23 ENCOUNTER — Encounter: Payer: Self-pay | Admitting: Internal Medicine

## 2023-11-29 ENCOUNTER — Encounter: Payer: Self-pay | Admitting: Gastroenterology

## 2023-11-29 ENCOUNTER — Ambulatory Visit: Admitting: Gastroenterology

## 2023-11-29 ENCOUNTER — Ambulatory Visit: Payer: Self-pay | Admitting: Gastroenterology

## 2023-11-29 ENCOUNTER — Other Ambulatory Visit (INDEPENDENT_AMBULATORY_CARE_PROVIDER_SITE_OTHER)

## 2023-11-29 VITALS — BP 104/60 | HR 82 | Ht 69.0 in | Wt 215.0 lb

## 2023-11-29 DIAGNOSIS — K5904 Chronic idiopathic constipation: Secondary | ICD-10-CM

## 2023-11-29 DIAGNOSIS — E119 Type 2 diabetes mellitus without complications: Secondary | ICD-10-CM

## 2023-11-29 DIAGNOSIS — Z8619 Personal history of other infectious and parasitic diseases: Secondary | ICD-10-CM

## 2023-11-29 DIAGNOSIS — R110 Nausea: Secondary | ICD-10-CM

## 2023-11-29 DIAGNOSIS — Z1211 Encounter for screening for malignant neoplasm of colon: Secondary | ICD-10-CM

## 2023-11-29 LAB — HEMOGLOBIN A1C: Hgb A1c MFr Bld: 9.5 % — ABNORMAL HIGH (ref 4.6–6.5)

## 2023-11-29 MED ORDER — ONDANSETRON HCL 4 MG PO TABS: 4.0000 mg | ORAL_TABLET | Freq: Two times a day (BID) | ORAL | 0 refills | Status: DC | PRN

## 2023-11-29 NOTE — Patient Instructions (Addendum)
 Constipation Take over the counter Metamucil 1 tsp po daily Can take over the counter Miralax  as needed to have bowel movement at least three days a week.   Nausea I [facility], to use as needed. Avoid NSAIDs Avoid a lot of sweets, cookies, cakes Keep log of blood sugars to give PCP  Please make appointment with your cardiologist  Your provider has requested that you go to the basement level for lab work before leaving today. Press B on the elevator. The lab is located at the first door on the left as you exit the elevator.  _______________________________________________________  If your blood pressure at your visit was 140/90 or greater, please contact your primary care physician to follow up on this.  _______________________________________________________  If you are age 39 or older, your body mass index should be between 23-30. Your Body mass index is 31.75 kg/m. If this is out of the aforementioned range listed, please consider follow up with your Primary Care Provider.  If you are age 61 or younger, your body mass index should be between 19-25. Your Body mass index is 31.75 kg/m. If this is out of the aformentioned range listed, please consider follow up with your Primary Care Provider.   ________________________________________________________  The [facility] providers would like to encourage you to use MYCHART to communicate with providers for non-urgent requests or questions.  Due to long hold times on the telephone, sending your provider a message by [facility] may be a faster and more efficient way to get a response.  Please allow 48 business hours for a response.  Please remember that this is for non-urgent requests.  _______________________________________________________  Thank you for trusting me with your gastrointestinal care. Deanna May, RNP
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
## 2023-11-30 NOTE — Progress Notes (Signed)
 Cardiology Office Note:    Date:  12/01/2023   ID:  [name], DOB [birth year], MRN [id number]  PCP:  Verdia Lombard, MD   [facility] Providers Cardiologist:  Aleene Passe, MD {  Referring MD: Verdia Lombard, MD   Chief Complaint  Patient presents with   Coronary Artery Disease   Congestive Heart Failure          History of Present Illness:    [name] is a 83 y.o. male with a hx of coronary artery disease, status post stent placement, congestive heart failure  He presents today for further evaluation and management of increasing shortness of breath.  [facility] - images not available ) report indicates he has normal LV systolic function,  grade I diastolic dysfunction.    Sept. 14, 2023  Here with worsening dyspnea  Chest pressure  With waking , feels presyncopal  Feels unsteady on his feet.   Had a stent placed several years ago Symptoms might be similar ,  Is now on Repatha   The progressive DOE / chest pressure has been present for several months   Eats sausage and bologna several times a week .   Medical doctor started Lasix  40 mg a day  Does not urinate much with that. Wt is 229  Sept. 15, 2023 [name] is seen for follow up of his dyspnea and chest tightness He was seen yesterday with apparent volume overload + rales bilaterally  Eats a very salty diet every day - Ive asked him to cut out his hot dogs and bologna   We started him on torsemide   ( instead of furosemide  ) when he went to his pharmacy they stated that they did not have the torsemide  ordered.  [facility] have [facility] that [facility]  Since he was not able to take the torsemide  he doubled up on his Lasix .  He put out quite a bit more urine.  His weight today is 226 pounds which is down 3 pounds from yesterday.  He states that he is not really breathing all that much better.   Sept. 19, 2023  [name] is seen today for  follow up of his CHF  Has reduced his salt intake. Wt is 224 lbs today ( down 2 lbs)  Eating salad, lots of fruit ( will need to check BMP / potassium today )  He still is not breathing well.  He still gets these episodes of hot sweats.  He still has severe shortness of breath with any activity.  We will schedule him for right and left heart catheterization.  We have discussed the risk, benefits, options of heart catheterization.  He understands and agrees to proceed.   September 05, 2022 [name] is seen for follow up of his CHF  He had shock wave lithotripsy and stenting of his prox LAD on Sept. 2023  Has some leg aches ,   He is on both Repatha and rosuvastatin .  Will have him hold his rosuvastatin  for 1 month to 2 months to see if his leg aches improved.  If they do improve then we will continue with Repatha alone.  If they do not improve with holding the rosuvastatin  we may consider restarting it.  Has left basilar rales,  has run out of his Torsemide     Oct. 22, 2024 [name] is seen for follow up of his CAD, CHF, HLD  Still has DOE with exertion Not  able to exercise Has no energy - feels like a zombie  Echocardiogram from August, 2023 reveals normal left ventricular systolic function.  He has mild, grade 1 diastolic dysfunction.  Mild mitral regurgitation.  Lots of fatigue  - check CBC and TSH today   Encouraged him to exercise He enjoys swimming    August 22, 2023 [name] is seen for his chronic diastolic CHF Still fatigued constantly Wakes up tired,  could sleep until 3 PM if he had the chance   His LDL was 219 in Jan.  2025 Was started on Repatha at that point   Is being treated for depression by his primary   Had coronary stenting in 2023   December 01, 2023  [name] is seen for follow up of his CAD  ,HLD  Fatigue  Is having near syncope  Would fall if he does not have something to hold onto  Occurs when he goes from sitting to standing   Will DC his Imdur   ( has been on  since his stenting procedure in 2023 , has not had any chest pain )   His last stent procedure was Sept. 2023 Ok to stop ASA soon  Continue plavix    All of his  symptoms are c/w orthostatic hypotension.  Symptoms have never occurred while lying down  . I think these symptoms will resolve / improve by stopping IMdur  .    Is having bowel issues  Has a BM perhaps once a week  Needs clearance for colonoscopy   He will be at moderate   risk for his upcoming colonoscopy    If the GI doctor is ok with continuing the ASA during the procedure, I would like for him to continue ASA and hold plavix  for 5 days . If the GI team needs to hold both ASA and plavix , then we will OK holding ASA and plavix  for 5 days prior to procedure  Restart Plavix   as soon as it is save from a surgical standpoint .      Hx of HLD and CAD  Was on atorvastatin  80 , LDL was still elevated at 780 Was started on Repatha     Past Medical History:  Diagnosis Date   Adenomatous colon polyp    Arthritis    CAD (coronary artery disease)    a. MI in 1996 with stent placement b. cath 01/18/17 - S/p PCI of in-stent restenosis of pLAD with cutting ballon & DES; 20% ostial LAD; 40% pro Cx; 60% focal pRCA   Clostridium difficile infection    Depression    Diverticulosis    H. pylori infection    HTN (hypertension)    Hyperlipidemia    MI (myocardial infarction) [facility])     Past Surgical History:  Procedure Laterality Date   BRAIN TUMOR EXCISION  1954   benign tumor in back of head, done at  [facility]   CARDIAC CATHETERIZATION     COLONOSCOPY     hx of polyps   CORONARY ANGIOPLASTY WITH STENT PLACEMENT  1996; 1997; 01/18/2017   CORONARY ATHERECTOMY N/A 02/28/2022   Procedure: CORONARY ATHERECTOMY;  Surgeon: Wendel Lurena POUR, MD;  Location: [facility] INVASIVE CV LAB;  Service: Cardiovascular;  Laterality: N/A;   CORONARY IMAGING/OCT N/A 02/28/2022   Procedure: INTRAVASCULAR IMAGING/OCT;  Surgeon: Wendel Lurena POUR, MD;  Location:  [facility] INVASIVE CV LAB;  Service: Cardiovascular;  Laterality: N/A;   CORONARY LITHOTRIPSY N/A 02/28/2022   Procedure: CORONARY LITHOTRIPSY;  Surgeon: Wendel Lurena POUR, MD;  Location: [facility] CV LAB;  Service: Cardiovascular;  Laterality: N/A;   CORONARY PRESSURE/FFR STUDY N/A 07/28/2020   Procedure: INTRAVASCULAR PRESSURE WIRE/FFR STUDY;  Surgeon: Anner Alm ORN, MD;  Location: [facility]AS[facility] CV LAB;  Service: Cardiovascular;  Laterality: N/A;   CORONARY PRESSURE/FFR STUDY N/A 02/28/2022   Procedure: INTRAVASCULAR PRESSURE WIRE/FFR STUDY;  Surgeon: Wendel Lurena POUR, MD;  Location: [facility];  Service: Cardiovascular;  Laterality: N/A;   CORONARY STENT INTERVENTION N/A 01/18/2017   Procedure: CORONARY STENT INTERVENTION;  Surgeon: Burnard Debby LABOR, MD;  Location: [facility]VAS[facility] CV [facility];  Service: Cardiovascular;  Laterality: N/A;   CORONARY STENT INTERVENTION N/A 02/28/2022   Procedure: CORONARY STENT INTERVENTION;  Surgeon: Wendel Lurena POUR, MD;  Location: [facility];  Service: Cardiovascular;  Laterality: N/A;   ESOPHAGOGASTRODUODENOSCOPY (EGD) WITH PROPOFOL  N/A 06/14/2018   Procedure: ESOPHAGOGASTRODUODENOSCOPY (EGD) WITH PROPOFOL ;  Surgeon: Avram Lupita BRAVO, MD;  Location: [facility]OSCOPY;  Service: Endoscopy;  Laterality: N/A;   JOINT REPLACEMENT     LEFT HEART CATH AND CORONARY ANGIOGRAPHY N/A 01/18/2017   Procedure: LEFT HEART CATH AND CORONARY ANGIOGRAPHY;  Surgeon: Burnard Debby LABOR, MD;  Location: [facility];  Service: Cardiovascular;  Laterality: N/A;   LEFT HEART CATH AND CORONARY ANGIOGRAPHY N/A 07/28/2020   Procedure: LEFT HEART CATH AND CORONARY ANGIOGRAPHY;  Surgeon: Anner Alm ORN, MD;  Location: [facility] INVASIVE CV LAB;  Service: Cardiovascular;  Laterality: N/A;   RIGHT/LEFT HEART CATH AND CORONARY ANGIOGRAPHY N/A 02/28/2022   Procedure: RIGHT/LEFT HEART CATH AND CORONARY ANGIOGRAPHY;  Surgeon: Wendel Lurena POUR, MD;  Location: [facility] INVASIVE CV LAB;  Service: Cardiovascular;   Laterality: N/A;   stent x2 in the LAD  with intra-aortic balloon pump support  1996   TONSILLECTOMY     TOTAL KNEE ARTHROPLASTY Bilateral     Current Medications: Current Meds  Medication Sig   acetaminophen  (TYLENOL ) 500 MG tablet Take 2 tablets (1,000 mg total) by mouth every 6 (six) hours.   celecoxib (CELEBREX) 100 MG capsule Take 100 mg by mouth daily.   clobetasol (TEMOVATE) 0.05 % external solution Apply 1 Application topically daily.   clopidogrel  (PLAVIX ) 75 MG tablet TAKE 1 TABLET BY MOUTH ONCE DAILY   docusate sodium  (COLACE) 100 MG capsule Take 1 capsule (100 mg total) by mouth 2 (two) times daily.   doxycycline  (VIBRA -TABS) 100 MG tablet Take 1 tablet (100 mg total) by mouth 2 (two) times daily. (Patient taking differently: Take 100 mg by mouth daily.)   escitalopram  (LEXAPRO ) 20 MG tablet Take 20 mg by mouth daily.   fenofibrate  (TRICOR ) 145 MG tablet TAKE ONE TABLET BY MOUTH ONCE DAILY   ketoconazole (NIZORAL) 2 % cream Apply 1 Application topically daily.   losartan  (COZAAR ) 25 MG tablet Take 1 tablet (25 mg total) by mouth daily.   methocarbamol  (ROBAXIN ) 500 MG tablet Take 1 tablet (500 mg total) by mouth every 8 (eight) hours as needed for muscle spasms (pain after rib fracture).   nitroGLYCERIN  (NITROSTAT ) 0.4 MG SL tablet PLACE 1 TABLET UNDER THE TONGUE EVERY 5 MINTUES AS NEEDED FOR CHEST PAIN   ondansetron  (ZOFRAN ) 4 MG tablet Take 1 tablet (4 mg total) by mouth every 12 (twelve) hours as needed for nausea or vomiting.   oxyCODONE -acetaminophen  (PERCOCET) 5-325 MG tablet Take 1 tablet by mouth every other day.   pantoprazole  (PROTONIX ) 40 MG tablet Take 40 mg by mouth daily.   polyethylene glycol (MIRALAX  / GLYCOLAX ) 17 g packet Take 17  g by mouth daily as needed (constipation).   Potassium Chloride ER 20 MEQ TBCR Take 1 tablet by mouth daily.   REPATHA SURECLICK 140 MG/ML SOAJ Inject 1 mL into the skin every 14 (fourteen) days.   sertraline (ZOLOFT) 50 MG tablet  Take 50 mg by mouth daily.   terbinafine  (LAMISIL ) 250 MG tablet Take 1 tablet (250 mg total) by mouth daily.   Torsemide  40 MG TABS Take 40 mg by mouth daily.   Vilazodone HCl 20 MG TABS 1 tablet with food Orally Once a day for 30 days   [DISCONTINUED] isosorbide  mononitrate (IMDUR ) 30 MG 24 hr tablet TAKE 1 & 1/2 TABLETS BY MOUTH ONCE DAILY     Allergies:   Gabapentin   Social History   Socioeconomic History   Marital status: Widowed    Spouse name: Not on file   Number of children: 1   Years of education: Not on file   Highest education level: Not on file  Occupational History   Occupation: Retired  Tobacco Use   Smoking status: Former    Current packs/day: 0.00    Types: Cigarettes    Quit date: 06/06/1994    Years since quitting: 29.5   Smokeless tobacco: Never  Vaping Use   Vaping status: Never Used  Substance and Sexual Activity   Alcohol use: No   Drug use: No   Sexual activity: Yes  Other Topics Concern   Not on file  Social History Narrative   Not on file   Social Drivers of Health   Financial Resource Strain: Not on file  Food Insecurity: No Food Insecurity (09/11/2023)   Hunger Vital Sign    Worried About Running Out of Food in the Last Year: Never true    Ran Out of Food in the Last Year: Never true  Transportation Needs: No Transportation Needs (09/11/2023)   PRAPARE - Administrator, Civil Service (Medical): No    Lack of Transportation (Non-Medical): No  Physical Activity: Not on file  Stress: Not on file  Social Connections: Socially Isolated (09/11/2023)   Social Connection and Isolation Panel    Frequency of Communication with Friends and Family: Once a week    Frequency of Social Gatherings with Friends and Family: Once a week    Attends Religious Services: More than 4 times per year    Active Member of [employer] or Organizations: No    Attends Banker Meetings: Never    Marital Status: Widowed     Family History: The  patient's family history includes Cancer in his sister; Heart attack in his mother; Heart attack (age of onset: 60) in his brother. There is no history of Colon cancer, Stomach cancer, Rectal cancer, or Esophageal cancer.  ROS:   Please see the history of present illness.     All other systems reviewed and are negative.  EKGs/Labs/Other Studies Reviewed:    The following studies were reviewed today:   EKG:           Recent Labs: 03/28/2023: TSH 2.020 09/12/2023: B Natriuretic Peptide 78.9; BUN 13; Creatinine, Ser 1.00; Hemoglobin 14.9; Platelets 191; Potassium 4.3; Sodium 139  Recent Lipid Panel    Component Value Date/Time   CHOL 195 12/21/2021 1059   TRIG 119 12/21/2021 1059   HDL 56 12/21/2021 1059   CHOLHDL 3.5 12/21/2021 1059   CHOLHDL 4 06/23/2009 1147   VLDL 23.4 06/23/2009 1147   LDLCALC 118 (H) 12/21/2021 1059  LDLDIRECT 159.1 12/10/2007 0851     Risk Assessment/Calculations:        Physical Exam:      Physical Exam: Blood pressure 116/84, pulse 77, height 5' 9 (1.753 m), weight 215 lb 12.8 oz (97.9 kg), SpO2 91%.       GEN:  Well nourished, well developed in no acute distress HEENT: Normal NECK: No JVD; No carotid bruits LYMPHATICS: No lymphadenopathy CARDIAC: RRR , no murmurs, rubs, gallops RESPIRATORY:  Clear to auscultation without rales, wheezing or rhonchi  ABDOMEN: Soft, non-tender, non-distended MUSCULOSKELETAL:  No edema; No deformity  SKIN: Warm and dry NEUROLOGIC:  Alert and oriented x 3     ASSESSMENT:    1. Orthostatic hypotension   2. Coronary artery disease with angina pectoris, unspecified vessel or lesion type, unspecified whether native or transplanted heart (HCC)         PLAN:       Acute on chronic diastolic congestive heart failure: .  No acute CHF symptoms.    2.  Orthostatic hypotension:   [name] is having episodes of near syncope that are very c/w orthostatic hypotension .   He is on IMdur  but is not  having angina .  Will DC the IMdur  and see how he does     3.  CAD : His last stent procedure was in September, 2023.  He is currently on aspirin  and Plavix .  Will continue both for now.  He needs a colonoscopy.  It is possible that the GI team will allow him to continue aspirin  through that procedure.  If the team wants him to following the procedure he will hold both aspirin  and Plavix  then we will give the okay to hold both aspirin  and Plavix  for 5 days.  Restart Plavix  75 mg a day.  4.  Generalized fatigue: .   5.  Constipation  Is having bowel issues  Has a BM perhaps once a week  Needs clearance for colonoscopy   He will be at moderate  risk for his upcoming colonoscopy    If the GI doctor is ok with continuing the ASA during the procedure, I would like for him to continue ASA and hold plavix  for 5 days . If the GI team needs to hold both ASA and plavix , then we will OK holding ASA and plavix  for 5 days prior to procedure  Restart Plavix   as soon as it is save from a surgical standpoint .   Medication Adjustments/Labs and Tests Ordered: Current medicines are reviewed at length with the patient today.  Concerns regarding medicines are outlined above.  No orders of the defined types were placed in this encounter.  No orders of the defined types were placed in this encounter.     Patient Instructions  Medication Instructions:  STOP Imdur   *If you need a refill on your cardiac medications before your next appointment, please call your pharmacy*  Lab Work: NONE   If you have labs (blood work) drawn today and your tests are completely normal, you will receive your results only by: MyChart Message (if you have MyChart) OR A paper copy in the mail If you have any lab test that is abnormal or we need to change your treatment, we will call you to review the results.  Testing/Procedures: NONE  Follow-Up: At [facility], you and your health needs are our priority.  As  part of our continuing mission to provide you with exceptional heart care, our providers are all  part of one team.  This team includes your primary Cardiologist (physician) and Advanced Practice Providers or APPs (Physician Assistants and Nurse Practitioners) who all work together to provide you with the care you need, when you need it.  Your next appointment:   3-4 months   Provider:   One of our Advanced Practice Providers (APPs): Morse Clause, PA-C  Lamarr Satterfield, NP Miriam Shams, NP  Olivia Pavy, PA-C Josefa Beauvais, NP  Leontine Salen, PA-C Orren Fabry, PA-C  [place], PA-C Ernest Dick, NP  Damien Braver, NP Jon Hails, PA-C  Waddell Donath, PA-C    Dayna Dunn, PA-C  Scott Weaver, PA-C Lum Louis, NP Katlyn West, NP Callie Goodrich, PA-C  Evan Williams, PA-C Sheng Haley, PA-C  Xika Zhao, NP Kathleen Johnson, PA-C    We recommend signing up for the patient portal called MyChart.  Sign up information is provided on this After Visit Summary.  MyChart is used to connect with patients for Virtual Visits (Telemedicine).  Patients are able to view lab/test results, encounter notes, upcoming appointments, etc.  Non-urgent messages can be sent to your provider as well.   To learn more about what you can do with MyChart, go to [email or web address].        Signed, Aleene Passe, MD  12/01/2023 5:36 PM    [facility]

## 2023-12-01 ENCOUNTER — Ambulatory Visit: Attending: Cardiovascular Disease | Admitting: Cardiovascular Disease

## 2023-12-01 ENCOUNTER — Encounter: Payer: Self-pay | Admitting: Cardiovascular Disease

## 2023-12-01 VITALS — BP 116/84 | HR 77 | Ht 69.0 in | Wt 215.8 lb

## 2023-12-01 DIAGNOSIS — I951 Orthostatic hypotension: Secondary | ICD-10-CM | POA: Diagnosis not present

## 2023-12-01 DIAGNOSIS — Z1211 Encounter for screening for malignant neoplasm of colon: Secondary | ICD-10-CM | POA: Diagnosis not present

## 2023-12-01 DIAGNOSIS — R110 Nausea: Secondary | ICD-10-CM | POA: Diagnosis not present

## 2023-12-01 DIAGNOSIS — I25119 Atherosclerotic heart disease of native coronary artery with unspecified angina pectoris: Secondary | ICD-10-CM | POA: Diagnosis not present

## 2023-12-01 DIAGNOSIS — Z8619 Personal history of other infectious and parasitic diseases: Secondary | ICD-10-CM | POA: Diagnosis not present

## 2023-12-01 DIAGNOSIS — K5904 Chronic idiopathic constipation: Secondary | ICD-10-CM | POA: Diagnosis not present

## 2023-12-01 NOTE — Telephone Encounter (Signed)
-----   Message from Cathryne PARAS May sent at 11/29/2023  4:55 PM EDT ----- Karna let the patient know his A1c has increased to 9.5. I will send this to his primary doctor. He will need to cut down on the sweets and carbohydrates.  Cathryne, NP ----- Message ----- From: Interface, Lab In Three Zero One Sent: 11/29/2023   4:04 PM EDT To: Cathryne PARAS May, NP

## 2023-12-01 NOTE — Patient Instructions (Addendum)
 Medication Instructions:  STOP Imdur   *If you need a refill on your cardiac medications before your next appointment, please call your pharmacy*  Lab Work: NONE   If you have labs (blood work) drawn today and your tests are completely normal, you will receive your results only by: MyChart Message (if you have MyChart) OR A paper copy in the mail If you have any lab test that is abnormal or we need to change your treatment, we will call you to review the results.  Testing/Procedures: NONE  Follow-Up: At [facility], you and your health needs are our priority.  As part of our continuing mission to provide you with exceptional heart care, our providers are all part of one team.  This team includes your primary Cardiologist (physician) and Advanced Practice Providers or APPs (Physician Assistants and Nurse Practitioners) who all work together to provide you with the care you need, when you need it.  Your next appointment:   3-4 months   Provider:   One of our Advanced Practice Providers (APPs): Morse Clause, PA-C  Lamarr Satterfield, NP Miriam Shams, NP  Olivia Pavy, PA-C Josefa Beauvais, NP  Leontine Salen, PA-C Orren Fabry, PA-C  [place], PA-C Ernest Dick, NP  Damien Braver, NP Jon Hails, PA-C  Waddell Donath, PA-C    Dayna Dunn, PA-C  Scott Weaver, PA-C Lum Louis, NP Katlyn West, NP Callie Goodrich, PA-C  Evan Williams, PA-C Sheng Haley, PA-C  Xika Zhao, NP Kathleen Johnson, PA-C    We recommend signing up for the patient portal called MyChart.  Sign up information is provided on this After Visit Summary.  MyChart is used to connect with patients for Virtual Visits (Telemedicine).  Patients are able to view lab/test results, encounter notes, upcoming appointments, etc.  Non-urgent messages can be sent to your provider as well.   To learn more about what you can do with MyChart, go to [email or web address].

## 2023-12-04 NOTE — Telephone Encounter (Signed)
 Patient returning call. Please advise

## 2023-12-04 NOTE — Telephone Encounter (Signed)
-----   Message from Euna JONETTA Pounds sent at 12/04/2023 10:12 AM EDT -----

## 2023-12-06 ENCOUNTER — Telehealth: Payer: Self-pay | Admitting: Gastroenterology

## 2023-12-06 NOTE — Telephone Encounter (Signed)
 Inbound call from patient returning phone regarding 6/25 lab results. Please advise, thank you

## 2023-12-07 ENCOUNTER — Telehealth: Payer: Self-pay | Admitting: *Deleted

## 2023-12-07 NOTE — Telephone Encounter (Signed)
 Patient informed of results.

## 2023-12-07 NOTE — Telephone Encounter (Signed)
 See telephone contact from 11/29/23 and 12/07/23.

## 2023-12-07 NOTE — Telephone Encounter (Signed)
-----   Message from Cathryne PARAS May sent at 12/04/2023  4:47 PM EDT ----- Regarding: h pylori test Karna- Let patient know the h pylori stool test negative.  Deanna, NP

## 2023-12-11 DIAGNOSIS — I25118 Atherosclerotic heart disease of native coronary artery with other forms of angina pectoris: Secondary | ICD-10-CM | POA: Diagnosis not present

## 2023-12-11 DIAGNOSIS — E1122 Type 2 diabetes mellitus with diabetic chronic kidney disease: Secondary | ICD-10-CM | POA: Diagnosis not present

## 2023-12-14 DIAGNOSIS — I25118 Atherosclerotic heart disease of native coronary artery with other forms of angina pectoris: Secondary | ICD-10-CM | POA: Diagnosis not present

## 2023-12-14 DIAGNOSIS — F323 Major depressive disorder, single episode, severe with psychotic features: Secondary | ICD-10-CM | POA: Diagnosis not present

## 2023-12-14 DIAGNOSIS — N1831 Chronic kidney disease, stage 3a: Secondary | ICD-10-CM | POA: Diagnosis not present

## 2023-12-14 DIAGNOSIS — E782 Mixed hyperlipidemia: Secondary | ICD-10-CM | POA: Diagnosis not present

## 2023-12-14 DIAGNOSIS — I130 Hypertensive heart and chronic kidney disease with heart failure and stage 1 through stage 4 chronic kidney disease, or unspecified chronic kidney disease: Secondary | ICD-10-CM | POA: Diagnosis not present

## 2023-12-14 DIAGNOSIS — I5022 Chronic systolic (congestive) heart failure: Secondary | ICD-10-CM | POA: Diagnosis not present

## 2023-12-14 DIAGNOSIS — E1122 Type 2 diabetes mellitus with diabetic chronic kidney disease: Secondary | ICD-10-CM | POA: Diagnosis not present

## 2024-01-20 ENCOUNTER — Emergency Department (HOSPITAL_COMMUNITY)
Admission: EM | Admit: 2024-01-20 | Discharge: 2024-01-20 | Disposition: A | Discharge status: Home / Self Care | Attending: Emergency Medicine | Admitting: Emergency Medicine

## 2024-01-20 ENCOUNTER — Other Ambulatory Visit: Payer: Self-pay

## 2024-01-20 DIAGNOSIS — Z7902 Long term (current) use of antithrombotics/antiplatelets: Secondary | ICD-10-CM | POA: Diagnosis not present

## 2024-01-20 DIAGNOSIS — H5789 Other specified disorders of eye and adnexa: Secondary | ICD-10-CM | POA: Diagnosis not present

## 2024-01-20 MED ORDER — TOBRAMYCIN 0.3 % OP SOLN
1.0000 [drp] | Freq: Once | OPHTHALMIC | Status: AC
Start: 2024-01-20 — End: 2024-01-20
  Administered 2024-01-20: 1 [drp] via OPHTHALMIC
  Filled 2024-01-20: qty 5

## 2024-01-20 MED ORDER — TETRACAINE HCL 0.5 % OP SOLN
2.0000 [drp] | Freq: Once | OPHTHALMIC | Status: AC
  Administered 2024-01-20: 2 [drp] via OPHTHALMIC
  Filled 2024-01-20: qty 4

## 2024-01-20 MED ORDER — FLUORESCEIN SODIUM 1 MG OP STRP
1.0000 | ORAL_STRIP | Freq: Once | OPHTHALMIC | Status: AC
  Administered 2024-01-20: 1 via OPHTHALMIC
  Filled 2024-01-20: qty 1

## 2024-01-20 NOTE — Discharge Instructions (Signed)
 Apply drops every 4 hours while awake to your right eye. Follow up with your eye doctor next week. Return to the ER for worsening or concerning symptoms.

## 2024-01-20 NOTE — ED Triage Notes (Signed)
 Pt reports feeling like something is in his right eye x1 week. 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 No contact lenses. Prior cataract surgery. No redness or swelling noted. Denies blurry vision

## 2024-01-20 NOTE — ED Provider Notes (Signed)
  EMERGENCY DEPARTMENT AT [facility] Provider Note   CSN: [id number] Arrival date & time: 01/20/24  0431     Patient presents with: Eye Pain   [name] is a 83 y.o. male.   83 yo male presents with complaint of right eye irritation x 1 week. Feels like he has something in the eye, does do metal grinding. Does not wear contacts, no vision changes. Pain woke him from sleep tonight which prompted ER visit.        Prior to Admission medications   Medication Sig Start Date End Date Taking? Authorizing Provider  acetaminophen  (TYLENOL ) 500 MG tablet Take 2 tablets (1,000 mg total) by mouth every 6 (six) hours. 09/12/23   Augustus Almarie RAMAN, PA-C  celecoxib (CELEBREX) 100 MG capsule Take 100 mg by mouth daily. 09/02/23   [provider]  clobetasol (TEMOVATE) 0.05 % external solution Apply 1 Application topically daily. 07/22/22   [provider]  clopidogrel  (PLAVIX ) 75 MG tablet TAKE 1 TABLET BY MOUTH ONCE DAILY 07/03/23   Nahser, Aleene PARAS, MD  docusate sodium  (COLACE) 100 MG capsule Take 1 capsule (100 mg total) by mouth 2 (two) times daily. 09/12/23   Augustus Almarie RAMAN, PA-C  doxycycline  (VIBRA -TABS) 100 MG tablet Take 1 tablet (100 mg total) by mouth 2 (two) times daily. Patient taking differently: Take 100 mg by mouth daily. 09/06/23   Tobie Franky SQUIBB, DPM  escitalopram  (LEXAPRO ) 20 MG tablet Take 20 mg by mouth daily. 05/13/18   [provider]  fenofibrate  (TRICOR ) 145 MG tablet TAKE ONE TABLET BY MOUTH ONCE DAILY 01/26/22   Nahser, Aleene PARAS, MD  ketoconazole (NIZORAL) 2 % cream Apply 1 Application topically daily. 09/02/23   [provider]  losartan  (COZAAR ) 25 MG tablet Take 1 tablet (25 mg total) by mouth daily. 09/13/23   Simaan, Elizabeth S, PA-C  methocarbamol  (ROBAXIN ) 500 MG tablet Take 1 tablet (500 mg total) by mouth every 8 (eight) hours as needed for muscle spasms (pain after rib fracture). 09/12/23   Augustus Almarie RAMAN,  PA-C  nitroGLYCERIN  (NITROSTAT ) 0.4 MG SL tablet PLACE 1 TABLET UNDER THE TONGUE EVERY 5 MINTUES AS NEEDED FOR CHEST PAIN 08/25/23   Nahser, Aleene PARAS, MD  ondansetron  (ZOFRAN ) 4 MG tablet Take 1 tablet (4 mg total) by mouth every 12 (twelve) hours as needed for nausea or vomiting. 11/29/23   May, Deanna J, NP  oxyCODONE -acetaminophen  (PERCOCET) 5-325 MG tablet Take 1 tablet by mouth every other day. 12/03/22   [provider]  pantoprazole  (PROTONIX ) 40 MG tablet Take 40 mg by mouth daily.    [provider]  polyethylene glycol (MIRALAX  / GLYCOLAX ) 17 g packet Take 17 g by mouth daily as needed (constipation). 09/12/23   Augustus Almarie RAMAN, PA-C  Potassium Chloride ER 20 MEQ TBCR Take 1 tablet by mouth daily.    [provider]  REPATHA SURECLICK 140 MG/ML SOAJ Inject 1 mL into the skin every 14 (fourteen) days. 02/11/22   [provider]  rosuvastatin  (CRESTOR ) 20 MG tablet TAKE ONE TABLET BY MOUTH ONCE DAILY Patient not taking: Reported on 12/01/2023 08/29/22   Nahser, Aleene PARAS, MD  sertraline (ZOLOFT) 50 MG tablet Take 50 mg by mouth daily.    [provider]  terbinafine  (LAMISIL ) 250 MG tablet Take 1 tablet (250 mg total) by mouth daily. 09/06/23   Tobie Franky SQUIBB, DPM  Torsemide  40 MG TABS Take 40 mg by mouth daily. 12/13/22  Nahser, Aleene PARAS, MD  Vilazodone HCl 20 MG TABS 1 tablet with food Orally Once a day for 30 days 09/05/23   [provider]    Allergies: Gabapentin    Review of Systems Negative except as per HPI Updated Vital Signs BP (!) 136/91 (BP Location: Left Arm)   Pulse 81   Temp (!) 97.5 F (36.4 C) (Oral)   Resp 16   SpO2 95%   Physical Exam Vitals and nursing note reviewed.  Constitutional:      Timothy: He is not in acute distress.    Appearance: He is well-developed. He is not diaphoretic.  HENT:     Head: Normocephalic and atraumatic.     Nose: Nose normal.  Eyes:     Timothy: Lids are normal. Lids are everted, no  foreign bodies appreciated. Vision grossly intact.        Right eye: No foreign body, discharge or hordeolum.     Intraocular pressure: Right eye pressure is 12 mmHg. Left eye pressure is 13 mmHg. Measurements were taken using a handheld tonometer.    Extraocular Movements: Extraocular movements intact.     Right eye: Normal extraocular motion and no nystagmus.     Left eye: Normal extraocular motion and no nystagmus.     Conjunctiva/sclera: Conjunctivae normal.     Right eye: Right conjunctiva is not injected. No chemosis, exudate or hemorrhage.    Left eye: Left conjunctiva is not injected. No chemosis, exudate or hemorrhage.    Pupils: Pupils are equal, round, and reactive to light.     Right eye: No corneal abrasion or fluorescein  uptake. Seidel exam negative.     Funduscopic exam:    Right eye: Red reflex present.     Slit lamp exam:    Right eye: Anterior chamber quiet. No corneal ulcer or photophobia.  Pulmonary:     Effort: Pulmonary effort is normal.  Neurological:     Mental Status: He is alert and oriented to person, place, and time.  Psychiatric:        Behavior: Behavior normal.     (all labs ordered are listed, but only abnormal results are displayed) Labs Reviewed - No data to display  EKG: None  Radiology: No results found.   Procedures   Medications Ordered in the ED  fluorescein  ophthalmic strip 1 strip (has no administration in time range)  tetracaine  (PONTOCAINE) 0.5 % ophthalmic solution 2 drop (has no administration in time range)  tobramycin  (TOBREX ) 0.3 % ophthalmic solution 1 drop (has no administration in time range)                                    Medical Decision Making Risk Prescription drug management.   83 year old male presents with complaint of pain in his right eye for the past week with foreign body sensation.  Patient does not wear contacts, reports vision to be normal.  Eye was evaluated, pain resolved with tetracaine .   Intraocular pressures are normal.  Anterior chamber is clear.  There is no obvious foreign body, no fluorescein  stain uptake, no streaming.  Lids were everted, no obvious foreign body present of the lids.  Plan is to discharge with antibiotic drops.  Advised to follow-up with his ophthalmologist and return as needed.     Final diagnoses:  Eye irritation    ED Discharge Orders     None  Beverley Leita LABOR, PA-C 01/20/24 0515    Trine Raynell Moder, MD 01/20/24 [phone]

## 2024-01-22 ENCOUNTER — Other Ambulatory Visit: Payer: Self-pay

## 2024-01-22 DIAGNOSIS — H02051 Trichiasis without entropian right upper eyelid: Secondary | ICD-10-CM | POA: Diagnosis not present

## 2024-01-22 DIAGNOSIS — H578A1 Foreign body sensation, right eye: Secondary | ICD-10-CM | POA: Diagnosis not present

## 2024-01-22 DIAGNOSIS — G4452 New daily persistent headache (NDPH): Secondary | ICD-10-CM | POA: Diagnosis not present

## 2024-01-22 DIAGNOSIS — R220 Localized swelling, mass and lump, head: Secondary | ICD-10-CM

## 2024-01-22 DIAGNOSIS — R2689 Other abnormalities of gait and mobility: Secondary | ICD-10-CM

## 2024-01-22 DIAGNOSIS — Z87898 Personal history of other specified conditions: Secondary | ICD-10-CM

## 2024-01-24 ENCOUNTER — Inpatient Hospital Stay
Admission: RE | Admit: 2024-01-24 | Discharge: 2024-01-24 | Discharge status: Home / Self Care | Source: Ambulatory Visit

## 2024-01-24 DIAGNOSIS — R2689 Other abnormalities of gait and mobility: Secondary | ICD-10-CM

## 2024-01-24 DIAGNOSIS — R220 Localized swelling, mass and lump, head: Secondary | ICD-10-CM | POA: Diagnosis not present

## 2024-01-24 DIAGNOSIS — G4452 New daily persistent headache (NDPH): Secondary | ICD-10-CM

## 2024-01-24 DIAGNOSIS — R519 Headache, unspecified: Secondary | ICD-10-CM | POA: Diagnosis not present

## 2024-01-24 DIAGNOSIS — Z87898 Personal history of other specified conditions: Secondary | ICD-10-CM

## 2024-01-30 ENCOUNTER — Ambulatory Visit: Admitting: Podiatry

## 2024-02-01 DIAGNOSIS — R519 Headache, unspecified: Secondary | ICD-10-CM | POA: Diagnosis not present

## 2024-02-01 DIAGNOSIS — I130 Hypertensive heart and chronic kidney disease with heart failure and stage 1 through stage 4 chronic kidney disease, or unspecified chronic kidney disease: Secondary | ICD-10-CM | POA: Diagnosis not present

## 2024-02-01 DIAGNOSIS — E782 Mixed hyperlipidemia: Secondary | ICD-10-CM | POA: Diagnosis not present

## 2024-02-01 DIAGNOSIS — I25118 Atherosclerotic heart disease of native coronary artery with other forms of angina pectoris: Secondary | ICD-10-CM | POA: Diagnosis not present

## 2024-02-01 DIAGNOSIS — F323 Major depressive disorder, single episode, severe with psychotic features: Secondary | ICD-10-CM | POA: Diagnosis not present

## 2024-02-01 DIAGNOSIS — I5022 Chronic systolic (congestive) heart failure: Secondary | ICD-10-CM | POA: Diagnosis not present

## 2024-02-01 DIAGNOSIS — N1831 Chronic kidney disease, stage 3a: Secondary | ICD-10-CM | POA: Diagnosis not present

## 2024-02-01 DIAGNOSIS — R197 Diarrhea, unspecified: Secondary | ICD-10-CM | POA: Diagnosis not present

## 2024-02-01 DIAGNOSIS — E1122 Type 2 diabetes mellitus with diabetic chronic kidney disease: Secondary | ICD-10-CM | POA: Diagnosis not present

## 2024-02-01 DIAGNOSIS — K219 Gastro-esophageal reflux disease without esophagitis: Secondary | ICD-10-CM | POA: Diagnosis not present

## 2024-02-02 ENCOUNTER — Ambulatory Visit: Admitting: Gastroenterology

## 2024-02-02 ENCOUNTER — Encounter: Payer: Self-pay | Admitting: Gastroenterology

## 2024-02-02 VITALS — BP 104/72 | HR 83 | Ht 69.0 in | Wt 199.0 lb

## 2024-02-02 DIAGNOSIS — R194 Change in bowel habit: Secondary | ICD-10-CM

## 2024-02-02 DIAGNOSIS — K219 Gastro-esophageal reflux disease without esophagitis: Secondary | ICD-10-CM | POA: Diagnosis not present

## 2024-02-02 MED ORDER — PANTOPRAZOLE SODIUM 40 MG PO TBEC
40.0000 mg | DELAYED_RELEASE_TABLET | Freq: Every day | ORAL | 2 refills | Status: DC
Start: 2024-02-02 — End: 2024-03-04
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
## 2024-02-02 NOTE — Patient Instructions (Addendum)
 Recommend GERD diet Increased Pantoprazole  40 mg twice daily, take 1 tablet 30 minutes before breakfast and dinner  _______________________________________________________  If your blood pressure at your visit was 140/90 or greater, please contact your primary care physician to follow up on this.  _______________________________________________________  If you are age 82 or older, your body mass index should be between 23-30. Your Body mass index is 29.39 kg/m. If this is out of the aforementioned range listed, please consider follow up with your Primary Care Provider.  If you are age 59 or younger, your body mass index should be between 19-25. Your Body mass index is 29.39 kg/m. If this is out of the aformentioned range listed, please consider follow up with your Primary Care Provider.   ________________________________________________________  The [facility] providers would like to encourage you to use MYCHART to communicate with providers for non-urgent requests or questions.  Due to long hold times on the telephone, sending your provider a message by [facility] may be a faster and more efficient way to get a response.  Please allow 48 business hours for a response.  Please remember that this is for non-urgent requests.  _______________________________________________________  [facility] is using a team-based approach to care.  Your team is made up of your doctor and two to three APPS. Our APPS (Nurse Practitioners and Physician Assistants) work with your physician to ensure care continuity for you. They are fully qualified to address your health concerns and develop a treatment plan. They communicate directly with your gastroenterologist to care for you. Seeing the Advanced Practice Practitioners on your physician's team can help you by facilitating care more promptly, often allowing for earlier appointments, access to diagnostic testing, procedures, and other specialty referrals.    Thank you for trusting me with your gastrointestinal care. Deanna May, FNP-C

## 2024-02-06 DIAGNOSIS — R197 Diarrhea, unspecified: Secondary | ICD-10-CM | POA: Diagnosis not present

## 2024-02-13 DIAGNOSIS — H02889 Meibomian gland dysfunction of unspecified eye, unspecified eyelid: Secondary | ICD-10-CM | POA: Diagnosis not present

## 2024-02-13 DIAGNOSIS — Z961 Presence of intraocular lens: Secondary | ICD-10-CM | POA: Diagnosis not present

## 2024-02-14 DIAGNOSIS — Z8669 Personal history of other diseases of the nervous system and sense organs: Secondary | ICD-10-CM | POA: Diagnosis not present

## 2024-02-14 DIAGNOSIS — H6123 Impacted cerumen, bilateral: Secondary | ICD-10-CM | POA: Diagnosis not present

## 2024-02-14 DIAGNOSIS — Z87898 Personal history of other specified conditions: Secondary | ICD-10-CM | POA: Diagnosis not present

## 2024-02-14 DIAGNOSIS — R2689 Other abnormalities of gait and mobility: Secondary | ICD-10-CM | POA: Diagnosis not present

## 2024-02-14 DIAGNOSIS — H9313 Tinnitus, bilateral: Secondary | ICD-10-CM | POA: Diagnosis not present

## 2024-02-19 ENCOUNTER — Other Ambulatory Visit: Payer: Self-pay | Admitting: Gastroenterology

## 2024-02-19 DIAGNOSIS — R110 Nausea: Secondary | ICD-10-CM

## 2024-02-21 ENCOUNTER — Ambulatory Visit (INDEPENDENT_AMBULATORY_CARE_PROVIDER_SITE_OTHER): Admitting: Podiatry

## 2024-02-21 ENCOUNTER — Encounter: Payer: Self-pay | Admitting: Podiatry

## 2024-02-21 DIAGNOSIS — R519 Headache, unspecified: Secondary | ICD-10-CM | POA: Diagnosis not present

## 2024-02-21 DIAGNOSIS — M79609 Pain in unspecified limb: Secondary | ICD-10-CM | POA: Diagnosis not present

## 2024-02-21 DIAGNOSIS — D689 Coagulation defect, unspecified: Secondary | ICD-10-CM | POA: Diagnosis not present

## 2024-02-21 DIAGNOSIS — K5901 Slow transit constipation: Secondary | ICD-10-CM | POA: Diagnosis not present

## 2024-02-21 DIAGNOSIS — Z23 Encounter for immunization: Secondary | ICD-10-CM | POA: Diagnosis not present

## 2024-02-21 DIAGNOSIS — B351 Tinea unguium: Secondary | ICD-10-CM

## 2024-02-21 NOTE — Progress Notes (Signed)
 This patient returns to my office for at risk foot care.  This patient requires this care by a professional since this patient will be at risk due to having coagulation defect.  Patient is taking plavix.  This patient is unable to cut nails himself since the patient cannot reach his nails.These nails are painful walking and wearing shoes.  This patient presents for at risk foot care today.  General Appearance  Alert, conversant and in no acute stress.  Vascular  Dorsalis pedis and posterior tibial  pulses are palpable  bilaterally.  Capillary return is within normal limits  bilaterally. Temperature is within normal limits  bilaterally.  Neurologic  Senn-Weinstein monofilament wire test within normal limits  bilaterally. Muscle power within normal limits bilaterally.  Nails Thick disfigured discolored nails with subungual debris  from hallux to fifth toes bilaterally. No evidence of bacterial infection or drainage bilaterally.  Orthopedic  No limitations of motion  feet .  No crepitus or effusions noted.  No bony pathology or digital deformities noted.  Skin  normotropic skin with no porokeratosis noted bilaterally.  No signs of infections or ulcers noted.     Onychomycosis  Pain in right toes  Pain in left toes  Consent was obtained for treatment procedures.   Mechanical debridement of nails 1-5  bilaterally performed with a nail nipper.  Filed with dremel without incident.    Return office visit    3   months                  Told patient to return for periodic foot care and evaluation due to potential at risk complications.   Helane Gunther DPM

## 2024-02-27 DIAGNOSIS — H02889 Meibomian gland dysfunction of unspecified eye, unspecified eyelid: Secondary | ICD-10-CM | POA: Diagnosis not present

## 2024-02-27 DIAGNOSIS — Z961 Presence of intraocular lens: Secondary | ICD-10-CM | POA: Diagnosis not present

## 2024-02-27 DIAGNOSIS — G514 Facial myokymia: Secondary | ICD-10-CM | POA: Diagnosis not present

## 2024-03-01 NOTE — Progress Notes (Deleted)
  Cardiology Office Note   Date:  03/01/2024  ID:  [name], DOB 1940-09-12, MRN [id number] PCP: Verdia Lombard, MD  [facility] Providers Cardiologist:  Aleene Passe, MD (Inactive)   History of Present Illness [name] is a 83 y.o. male with past medical history of coronary artery disease status post stent placement, congestive heart failure here for follow-up appointment.  He was initially seen February 17, 2022 and had a history of a stent placed several years ago.  Was on Repatha.  Was eating a lot of high sodium items like sausage and bologna several times a week.  He was having more shortness of breath and chest pressure with walking.  Also feeling presyncopal and unsteady on his feet.  He was asked to cut down on hot dogs and below-knee.  Started on torsemide  instead of furosemide .  September 19 he was seen and since he had not been breathing well and was still very short of breath with activity with episodes of hot sweats a right and left cardiac catheterization was ordered.  September 05, 2022 he was seen for CHF.  He had shockwave lithotripsy and stenting of his proximal LAD September 2023.  Was having some leg achiness on both Repatha and rosuvastatin .  He was told to hold his rosuvastatin  for 1 to 2 months to see if his leg aches improved.  August 22, 2023 was the last time he was seen.  Was waking up tired and could sleep until 3 PM if he had the chance.  LDL was 219 as of January 7974.  Was started on Repatha at that point.  Being treated for depression by his PCP.  Today, he ***  ROS: pertinent ROS in HPI  Studies Reviewed      *** Risk Assessment/Calculations {Does this patient have ATRIAL FIBRILLATION?:[phone]} No BP recorded.  {Refresh Note OR Click here to enter BP  :1}***   STOP-Bang Score:  5  { Consider Dx Sleep Disordered Breathing or Sleep Apnea  ICD G47.33          :1}    Physical Exam VS:  There were no vitals taken for this visit.        Wt Readings from Last 3 Encounters:  02/02/24 199 lb (90.3 kg)  12/01/23 215 lb 12.8 oz (97.9 kg)  11/29/23 215 lb (97.5 kg)    GEN: Well nourished, well developed in no acute distress NECK: No JVD; No carotid bruits CARDIAC: ***RRR, no murmurs, rubs, gallops RESPIRATORY:  Clear to auscultation without rales, wheezing or rhonchi  ABDOMEN: Soft, non-tender, non-distended EXTREMITIES:  No edema; No deformity   ASSESSMENT AND PLAN Coronary artery disease Hypertension Acute on chronic combined systolic and diastolic CHF Mixed hyperlipidemia Fatigue OSA    {Are you ordering a CV Procedure (e.g. stress test, cath, DCCV, TEE, etc)?   Press F2        :[id number]}  Dispo: ***  Signed, Orren LOISE Fabry, PA-C

## 2024-03-03 ENCOUNTER — Other Ambulatory Visit: Payer: Self-pay

## 2024-03-03 ENCOUNTER — Emergency Department (HOSPITAL_COMMUNITY)

## 2024-03-03 ENCOUNTER — Encounter (HOSPITAL_COMMUNITY): Payer: Self-pay

## 2024-03-03 ENCOUNTER — Emergency Department (HOSPITAL_COMMUNITY)
Admission: EM | Admit: 2024-03-03 | Discharge: 2024-03-04 | Disposition: A | Discharge status: Home / Self Care | Attending: Emergency Medicine | Admitting: Emergency Medicine

## 2024-03-03 DIAGNOSIS — R0789 Other chest pain: Secondary | ICD-10-CM | POA: Diagnosis not present

## 2024-03-03 DIAGNOSIS — J9811 Atelectasis: Secondary | ICD-10-CM | POA: Diagnosis not present

## 2024-03-03 DIAGNOSIS — K2100 Gastro-esophageal reflux disease with esophagitis, without bleeding: Secondary | ICD-10-CM | POA: Diagnosis not present

## 2024-03-03 DIAGNOSIS — R0989 Other specified symptoms and signs involving the circulatory and respiratory systems: Secondary | ICD-10-CM | POA: Diagnosis not present

## 2024-03-03 DIAGNOSIS — K219 Gastro-esophageal reflux disease without esophagitis: Secondary | ICD-10-CM

## 2024-03-03 LAB — CBC
HCT: 49.5 % (ref 39.0–52.0)
Hemoglobin: 15.7 g/dL (ref 13.0–17.0)
MCH: 30.5 pg (ref 26.0–34.0)
MCHC: 31.7 g/dL (ref 30.0–36.0)
MCV: 96.3 fL (ref 80.0–100.0)
Platelets: 248 K/uL (ref 150–400)
RBC: 5.14 MIL/uL (ref 4.22–5.81)
RDW: 14.2 % (ref 11.5–15.5)
WBC: 7 K/uL (ref 4.0–10.5)
nRBC: 0 % (ref 0.0–0.2)

## 2024-03-03 LAB — LIPASE, BLOOD: Lipase: 28 U/L (ref 11–51)

## 2024-03-03 LAB — COMPREHENSIVE METABOLIC PANEL WITH GFR
ALT: 14 U/L (ref 0–44)
AST: 21 U/L (ref 15–41)
Albumin: 3.5 g/dL (ref 3.5–5.0)
Alkaline Phosphatase: 57 U/L (ref 38–126)
Anion gap: 10 (ref 5–15)
BUN: 14 mg/dL (ref 8–23)
CO2: 24 mmol/L (ref 22–32)
Calcium: 8.8 mg/dL — ABNORMAL LOW (ref 8.9–10.3)
Chloride: 105 mmol/L (ref 98–111)
Creatinine, Ser: 1.31 mg/dL — ABNORMAL HIGH (ref 0.61–1.24)
GFR, Estimated: 55 mL/min — ABNORMAL LOW (ref >60)
Glucose, Bld: 100 mg/dL — ABNORMAL HIGH (ref 70–99)
Potassium: 4.1 mmol/L (ref 3.5–5.1)
Sodium: 139 mmol/L (ref 135–145)
Total Bilirubin: 1.2 mg/dL (ref 0.0–1.2)
Total Protein: 6.5 g/dL (ref 6.5–8.1)

## 2024-03-03 MED ORDER — PANTOPRAZOLE SODIUM 40 MG IV SOLR
40.0000 mg | Freq: Once | INTRAVENOUS | Status: AC
Start: 2024-03-03 — End: 2024-03-04
  Administered 2024-03-04: 40 mg via INTRAVENOUS
  Filled 2024-03-03: qty 10

## 2024-03-03 MED ORDER — LACTATED RINGERS IV BOLUS
1000.0000 mL | Freq: Once | INTRAVENOUS | Status: AC
Start: 2024-03-03 — End: 2024-03-04
  Administered 2024-03-04: 1000 mL via INTRAVENOUS

## 2024-03-03 MED ORDER — METOCLOPRAMIDE HCL 5 MG/ML IJ SOLN
10.0000 mg | Freq: Once | INTRAMUSCULAR | Status: AC
  Administered 2024-03-04: 10 mg via INTRAVENOUS
  Filled 2024-03-03: qty 2

## 2024-03-03 NOTE — ED Triage Notes (Signed)
 Pt  came in pov for indigestion that has been going on for 2 weeks. Pt stated it has been progressively getting worse. Able to keep the food, but feels indigested afterwards. Pt feels constipated. Denies abd pain. A&O X4.

## 2024-03-03 NOTE — ED Provider Notes (Signed)
 [place] EMERGENCY DEPARTMENT AT [facility] Provider Note   CSN: [id number] Arrival date & time: 03/03/24  1824     History Chief Complaint  Patient presents with   Gastroesophageal Reflux    HPI [name] is a 83 y.o. male presenting for chief complaint of chest discomfort. States that after he eats he gets fairly severe  chest discomfort.  Recently started on Monjaro about 3 days before the symptoms started. States that after meals he gets a bad burning and gas sensation in his chest and abdomen. Tried alka seltzer and pepto without any meaningful improvement.  States that it only bothers him around meals.  Recently saw gastroenterologists Patient's recorded medical, surgical, social, medication list and allergies were reviewed in the Snapshot window as part of the initial history.   Review of Systems   Review of Systems  Constitutional:  Negative for chills and fever.  HENT:  Negative for ear pain and sore throat.   Eyes:  Negative for pain and visual disturbance.  Respiratory:  Positive for chest tightness. Negative for cough and shortness of breath.   Cardiovascular:  Negative for chest pain and palpitations.  Gastrointestinal:  Positive for vomiting. Negative for abdominal pain.  Genitourinary:  Negative for dysuria and hematuria.  Musculoskeletal:  Negative for arthralgias and back pain.  Skin:  Negative for color change and rash.  Neurological:  Negative for seizures and syncope.  All other systems reviewed and are negative.   Physical Exam Updated Vital Signs BP 119/87 (BP Location: Right Arm)   Pulse 77   Temp 97.6 F (36.4 C) (Oral)   Resp 18   SpO2 99%  Physical Exam Vitals and nursing note reviewed.  Constitutional:      General: He is not in acute distress.    Appearance: He is well-developed.  HENT:     Head: Normocephalic and atraumatic.  Eyes:     Conjunctiva/sclera: Conjunctivae normal.  Cardiovascular:     Rate and Rhythm:  Normal rate and regular rhythm.     Heart sounds: No murmur heard. Pulmonary:     Effort: Pulmonary effort is normal. No respiratory distress.     Breath sounds: Normal breath sounds.  Abdominal:     Palpations: Abdomen is soft.     Tenderness: There is no abdominal tenderness.  Musculoskeletal:        General: No swelling.     Cervical back: Neck supple.  Skin:    General: Skin is warm and dry.     Capillary Refill: Capillary refill takes less than 2 seconds.  Neurological:     Mental Status: He is alert.  Psychiatric:        Mood and Affect: Mood normal.      ED Course/ Medical Decision Making/ A&P Clinical Course as of 03/04/24 0451  Sun Mar 03, 2024  2324 Creatinine(!): 1.31 Baseline 1.0 will give IVF. [CC]    Clinical Course User Index [CC] Jerral Meth, MD    Procedures Procedures   Medications Ordered in ED Medications  pantoprazole  (PROTONIX ) injection 40 mg (40 mg Intravenous Given 03/04/24 0039)  metoCLOPramide (REGLAN) injection 10 mg (10 mg Intravenous Given 03/04/24 0042)  lactated ringers bolus 1,000 mL (0 mLs Intravenous Stopped 03/04/24 0155)    Medical Decision Making:   This is an 83 year old male primarily requesting treatment for his gastroesophageal reflux disease.  States that he has a history of similar but that it has acutely worsened since he restarted Mounjaro.  Was seen by his gastroenterologist, they discussed having him prepared for colonoscopy and endoscopy but patient did not want a go through that again at the time as the symptoms were mild.  However over the last 2 weeks his symptoms have become much more persistent where he gets severe pain after each meal that bothers him late into the day. He states it goes away when he is not eating but comes back pretty quickly after eating.  He endorses early satiety and intermittent nausea. His history of present illness and physical exam findings are more consistent with worsening gastric  dysmotility from new medication changes likely worsening his gastroesophageal reflux disease. However given his age and cardiac history, cardiac abnormality would be atypical but not impossible. Serial troponins and EKG were performed and patient was observed for total of 10 hours in the ER with complete resolution of symptoms after medications.  This constellation of presentation makes cardiac disease considered much less likely.  X-ray without focal pneumonia pneumothorax or other clear pathology. He is grossly improving throughout his observation window and feels comfortable with outpatient follow-up with gastroenterology. Strict return precautions regarding recurrence reinforced and patient expressed understanding.  Will increase his Protonix  to twice daily as it appears this was the last gastroenterology recommendation though he has not taken action on it yet.  Clinical Impression:  1. Gastroesophageal reflux disease with esophagitis without hemorrhage   2. Gastroesophageal reflux disease, unspecified whether esophagitis present      Discharge   Final Clinical Impression(s) / ED Diagnoses Final diagnoses:  Gastroesophageal reflux disease with esophagitis without hemorrhage    Rx / DC Orders ED Discharge Orders          Ordered    pantoprazole  (PROTONIX ) 40 MG tablet  2 times daily        03/04/24 0356              Jerral Meth, MD 03/04/24 0451

## 2024-03-04 ENCOUNTER — Ambulatory Visit: Admitting: Physician Assistant

## 2024-03-04 DIAGNOSIS — K2100 Gastro-esophageal reflux disease with esophagitis, without bleeding: Secondary | ICD-10-CM | POA: Diagnosis not present

## 2024-03-04 DIAGNOSIS — I5042 Chronic combined systolic (congestive) and diastolic (congestive) heart failure: Secondary | ICD-10-CM

## 2024-03-04 DIAGNOSIS — R5383 Other fatigue: Secondary | ICD-10-CM

## 2024-03-04 DIAGNOSIS — E785 Hyperlipidemia, unspecified: Secondary | ICD-10-CM

## 2024-03-04 DIAGNOSIS — I25119 Atherosclerotic heart disease of native coronary artery with unspecified angina pectoris: Secondary | ICD-10-CM

## 2024-03-04 DIAGNOSIS — I10 Essential (primary) hypertension: Secondary | ICD-10-CM

## 2024-03-04 DIAGNOSIS — I5032 Chronic diastolic (congestive) heart failure: Secondary | ICD-10-CM

## 2024-03-04 LAB — TROPONIN I (HIGH SENSITIVITY)
Troponin I (High Sensitivity): 10 ng/L (ref <18)
Troponin I (High Sensitivity): 10 ng/L (ref <18)

## 2024-03-04 MED ORDER — PANTOPRAZOLE SODIUM 40 MG PO TBEC: 40.0000 mg | DELAYED_RELEASE_TABLET | Freq: Two times a day (BID) | ORAL | 2 refills | Status: AC

## 2024-03-07 DIAGNOSIS — R269 Unspecified abnormalities of gait and mobility: Secondary | ICD-10-CM | POA: Diagnosis not present

## 2024-03-07 DIAGNOSIS — R519 Headache, unspecified: Secondary | ICD-10-CM | POA: Diagnosis not present

## 2024-03-07 DIAGNOSIS — R270 Ataxia, unspecified: Secondary | ICD-10-CM | POA: Diagnosis not present

## 2024-03-07 DIAGNOSIS — K219 Gastro-esophageal reflux disease without esophagitis: Secondary | ICD-10-CM | POA: Diagnosis not present
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
## 2024-03-12 NOTE — Assessment & Plan Note (Signed)
 History of prior MI in 1996 and DES to LAD in 2018.  Status post OCT guided PCI with shockwave lithotripsy, orbital atherectomy and DES to the ostial LAD in September 2023.  Proximal LAD stent was patent and he had moderate nonobstructive disease elsewhere.  His chest symptoms are not like his previous angina.  He had chest pain for 2+ days when he went to the ED and his hsTrops were completely neg x 2. The symptoms are gastrointestinal in nature.  His EKG does not demonstrate any changes.  At this point, I do not think he needs ischemic evaluation. -Continue Plavix  75 mg daily, Repatha 140 mg every 2 weeks, nitroglycerin  as needed, Crestor  20 mg daily -Will need to stop Plavix  if he is showing signs of bleeding  -Obtain echocardiogram -Follow-up 8 to 10 weeks

## 2024-03-13 ENCOUNTER — Encounter: Payer: Self-pay | Admitting: Physician Assistant

## 2024-03-13 ENCOUNTER — Ambulatory Visit: Attending: Physician Assistant | Admitting: Physician Assistant

## 2024-03-13 ENCOUNTER — Other Ambulatory Visit

## 2024-03-13 ENCOUNTER — Other Ambulatory Visit: Payer: Self-pay | Admitting: Physician Assistant

## 2024-03-13 VITALS — BP 117/78 | HR 75 | Ht 69.0 in | Wt 195.0 lb

## 2024-03-13 DIAGNOSIS — I2510 Atherosclerotic heart disease of native coronary artery without angina pectoris: Secondary | ICD-10-CM

## 2024-03-13 DIAGNOSIS — R55 Syncope and collapse: Secondary | ICD-10-CM | POA: Insufficient documentation

## 2024-03-13 DIAGNOSIS — I10 Essential (primary) hypertension: Secondary | ICD-10-CM | POA: Insufficient documentation

## 2024-03-13 DIAGNOSIS — K2100 Gastro-esophageal reflux disease with esophagitis, without bleeding: Secondary | ICD-10-CM

## 2024-03-13 DIAGNOSIS — E782 Mixed hyperlipidemia: Secondary | ICD-10-CM | POA: Diagnosis not present

## 2024-03-13 DIAGNOSIS — I5032 Chronic diastolic (congestive) heart failure: Secondary | ICD-10-CM | POA: Diagnosis not present

## 2024-03-13 MED ORDER — SUCRALFATE 1 G PO TABS: 1.0000 g | ORAL_TABLET | Freq: Three times a day (TID) | ORAL | 1 refills | Status: AC

## 2024-03-13 MED ORDER — TORSEMIDE 40 MG PO TABS: 20.0000 mg | ORAL_TABLET | Freq: Every day | ORAL | Status: AC
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
## 2024-03-13 NOTE — Progress Notes (Unsigned)
 Enrolled for [employer] to mail a ZIO AT Live Telemetry monitor to patients address on file.   Dr. DOROTHA Calender to read.

## 2024-03-13 NOTE — Assessment & Plan Note (Signed)
 The patient's blood pressure is controlled on his current regimen.  Continue current therapy.  If he continues to have issues with orthostasis, we may need to decrease or discontinue his losartan .

## 2024-03-13 NOTE — Assessment & Plan Note (Signed)
 Continue Repatha 140 mg every 2 weeks, fenofibrate  145 mg daily, rosuvastatin  20 mg daily.  Will need to arrange fasting lipids at next visit.

## 2024-03-13 NOTE — Patient Instructions (Signed)
 Medication Instructions:  DECREASE Torsemide  to 20mg  Take 1 tablet once a day and can take a extra 20mg  for increased weight gain or swelling START Sucralfate 1gm four times a day 1 tablet with each meal and one tablet before bed *If you need a refill on your cardiac medications before your next appointment, please call your pharmacy*  Lab Work: TODAY-BMET & CBC If you have labs (blood work) drawn today and your tests are completely normal, you will receive your results only by: MyChart Message (if you have MyChart) OR A paper copy in the mail If you have any lab test that is abnormal or we need to change your treatment, we will call you to review the results.  Testing/Procedures: Your physician has requested that you have an echocardiogram. Echocardiography is a painless test that uses sound waves to create images of your heart. It provides your doctor with information about the size and shape of your heart and how well your heart's chambers and valves are working. This procedure takes approximately one hour. There are no restrictions for this procedure. Please do NOT wear cologne, perfume, aftershave, or lotions (deodorant is allowed). Please arrive 15 minutes prior to your appointment time.  Please note: We ask at that you not bring children with you during ultrasound (echo/ vascular) testing. Due to room size and safety concerns, children are not allowed in the ultrasound rooms during exams. Our front office staff cannot provide observation of children in our lobby area while testing is being conducted. An adult accompanying a patient to their appointment will only be allowed in the ultrasound room at the discretion of the ultrasound technician under special circumstances. We apologize for any inconvenience.  ZIO AT Long term monitor-Live Telemetry  Your physician has requested you wear a ZIO patch monitor for 14 days.  This is a single patch monitor. [employer] supplies one patch monitor per  enrollment. Additional  stickers are not available.  Please do not apply patch if you will be having a Nuclear Stress Test, Echocardiogram, Cardiac CT, MRI,  or Chest Xray during the period you would be wearing the monitor. The patch cannot be worn during  these tests. You cannot remove and re-apply the ZIO AT patch monitor.  Your ZIO patch monitor will be mailed 3 day USPS to your address on file. It may take 3-5 days to  receive your monitor after you have been enrolled.  Once you have received your monitor, please review the enclosed instructions. Your monitor has  already been registered assigning a specific monitor serial # to you.   Billing and Patient Assistance Program information  M[place]eth has been supplied with any insurance information on record for billing. [employer] offers a sliding scale Patient Assistance Program for patients without insurance, or whose  insurance does not completely cover the cost of the ZIO patch monitor. You must apply for the  Patient Assistance Program to qualify for the discounted rate. To apply, call [employer] at [phone],  select option 4, select option 2 , ask to apply for the Patient Assistance Program, (you can request an  interpreter if needed). [employer] will ask your household income and how many people are in your  household. [employer] will quote your out-of-pocket cost based on this information. They will also be able  to set up a 12 month interest free payment plan if needed.  Applying the monitor   Shave hair from upper left chest.  Hold the abrader disc by orange tab. Rub the  abrader in 40 strokes over left upper chest as indicated in  your monitor instructions.  Clean area with 4 enclosed alcohol pads. Use all pads to ensure the area is cleaned thoroughly. Let  dry.  Apply patch as indicated in monitor instructions. Patch will be placed under collarbone on left side of  chest with arrow pointing upward.  Rub patch adhesive wings for 2  minutes. Remove the white label marked 1. Remove the white label  marked 2. Rub patch adhesive wings for 2 additional minutes.  While looking in a mirror, press and release button in center of patch. A small green light will flash 3-4  times. This will be your only indicator that the monitor has been turned on.  Do not shower for the first 24 hours. You may shower after the first 24 hours.  Press the button if you feel a symptom. You will hear a small click. Record Date, Time and Symptom in  the Patient Log.   Starting the Gateway  In your kit there is a Audiological scientist box the size of a cellphone. This is Buyer, retail. It transmits all your  recorded data to [facility]. This box must always stay within 10 feet of you. Open the box and push the *  button. There will be a light that blinks orange and then green a few times. When the light stops  blinking, the Gateway is connected to the ZIO patch. Call [employer] at [phone] to confirm your monitor is transmitting.  Returning your monitor  Remove your patch and place it inside the Gateway. In the lower half of the Gateway there is a white  bag with prepaid postage on it. Place Gateway in bag and seal. Mail package back to [place] as soon as  possible. Your physician should have your final report approximately 7 days after you have mailed back  your monitor. Call [facility] Customer Care at [phone] if you have questions regarding your ZIO AT  patch monitor. Call them immediately if you see an orange light blinking on your monitor.  If your monitor falls off in less than 4 days, contact our Monitor department at [phone]. If your  monitor becomes loose or falls off after 4 days call [employer] at [phone] for suggestions on  securing your monitor   Follow-Up: At [facility], you and your health needs are our priority.  As part of our continuing mission to provide you with exceptional heart care, our  providers are all part of one team.  This team includes your primary Cardiologist (physician) and Advanced Practice Providers or APPs (Physician Assistants and Nurse Practitioners) who all work together to provide you with the care you need, when you need it.  Your next appointment:   8-10 week(s)  Provider:   Emeline Calender, MD or Glendia Ferrier, PA  We recommend signing up for the patient portal called MyChart.  Sign up information is provided on this After Visit Summary.  MyChart is used to connect with patients for Virtual Visits (Telemedicine).  Patients are able to view lab/test results, encounter notes, upcoming appointments, etc.  Non-urgent messages can be sent to your provider as well.   To learn more about what you can do with MyChart, go to [email or web address].   Other Instructions

## 2024-03-14 ENCOUNTER — Ambulatory Visit: Payer: Self-pay | Admitting: Physician Assistant

## 2024-03-14 DIAGNOSIS — I5032 Chronic diastolic (congestive) heart failure: Secondary | ICD-10-CM

## 2024-03-14 LAB — BASIC METABOLIC PANEL WITH GFR
BUN/Creatinine Ratio: 11 (ref 10–24)
BUN: 14 mg/dL (ref 8–27)
CO2: 26 mmol/L (ref 20–29)
Calcium: 9.7 mg/dL (ref 8.6–10.2)
Chloride: 103 mmol/L (ref 96–106)
Creatinine, Ser: 1.25 mg/dL (ref 0.76–1.27)
Glucose: 97 mg/dL (ref 70–99)
Potassium: 4.3 mmol/L (ref 3.5–5.2)
Sodium: 144 mmol/L (ref 134–144)
eGFR: 58 mL/min/1.73 — AB (ref >59)

## 2024-03-14 LAB — CBC
Hematocrit: 49.9 % (ref 37.5–51.0)
Hemoglobin: 16.1 g/dL (ref 13.0–17.7)
MCH: 31.3 pg (ref 26.6–33.0)
MCHC: 32.3 g/dL (ref 31.5–35.7)
MCV: 97 fL (ref 79–97)
Platelets: 254 x10E3/uL (ref 150–450)
RBC: 5.14 x10E6/uL (ref 4.14–5.80)
RDW: 13.1 % (ref 11.6–15.4)
WBC: 6.7 x10E3/uL (ref 3.4–10.8)

## 2024-03-15 NOTE — Progress Notes (Signed)
The patient has been notified of the result and verbalized understanding.  All questions (if any) were answered.     

## 2024-03-18 ENCOUNTER — Ambulatory Visit: Admitting: Gastroenterology

## 2024-03-18 ENCOUNTER — Telehealth: Payer: Self-pay

## 2024-03-18 VITALS — BP 118/78 | HR 78 | Ht 68.0 in | Wt 200.5 lb

## 2024-03-18 DIAGNOSIS — K5909 Other constipation: Secondary | ICD-10-CM | POA: Diagnosis not present

## 2024-03-18 DIAGNOSIS — R195 Other fecal abnormalities: Secondary | ICD-10-CM | POA: Diagnosis not present

## 2024-03-18 DIAGNOSIS — R1319 Other dysphagia: Secondary | ICD-10-CM

## 2024-03-18 DIAGNOSIS — K219 Gastro-esophageal reflux disease without esophagitis: Secondary | ICD-10-CM

## 2024-03-18 DIAGNOSIS — K921 Melena: Secondary | ICD-10-CM

## 2024-03-18 NOTE — Telephone Encounter (Signed)
 [facility] Pre-operative Risk Assessment     Request for surgical clearance:     Endoscopy Procedure  What type of surgery is being performed?     endoscopy  When is this surgery scheduled?     04/02/2024  What type of clearance is required ?   Pharmacy  Are there any medications that need to be held prior to surgery and how long? Plavix  - 5 days  Practice name and name of physician performing surgery?      [facility]  What is your office phone and fax number?      Phone- [phone]  Fax- [phone]  Anesthesia type (None, local, MAC, general) ?       MAC   Please route your response to [facility]

## 2024-03-18 NOTE — Progress Notes (Signed)
 Chief Complaint: follow-up constipation, discuss colon Primary GI Doctor: (previously Dr. Teressa) Dr. Charlanne  HPI:  Patient is a  83  year old male patient with past medical history of CAD s/p DES to pLAD 02/28/22, HFpEF, HLD, who was self referred to me by Glendia Ferrier, PA-C for worsening GERD and melena.  Last seen in GI office by myself on 02/02/24  12/01/2023 patient seen by cardiology for clearance for colonoscopy.  Patient considered moderate risk.  Okay to hold Plavix  for 5 days.  03/03/24 seen in ED for GERD and chest discomfort. Recently started on Monjaro about 3 days before the symptoms started. Serial troponins and EKG were performed and patient was observed for total of 10 hours in the ER with complete resolution of symptoms after medications. This constellation of presentation makes cardiac disease considered much less likely. X-ray without focal pneumonia pneumothorax or other clear pathology. Will increase his Protonix  to twice daily as it appears this was the last gastroenterology recommendation though he has not taken action on it yet.   03/13/24 visit with cardiology for follow up of CAD, CHF. He was seen in the ED 03/03/24 for chest pain associated with meals. Troponins were neg x 2. EKG showed Right Bundle Branch Block, LAFB and no change from prior. CXR was unremarkable. He was given GI meds and felt better. EDP notes reviewed. Plan is to follow up with GI. He experiences severe indigestion. The indigestion is persistent and severe. Eating sometimes worsens the symptoms, but cool liquid seems to help temporarily. He reports black stools occurring three to four days ago, which have since returned to normal color.   Interval History    Patient presents for follow-up for evaluation of worsening GERD and reported melena.  Patient reports he feels as if he has a lump in the back of his throat he cannot clear.  Patient has started the PPI therapy twice daily since he saw the cardiologist  last week.  Patient states he noticed some improvement.  Patient also complains of burning in his throat and chest.  Patient was also started on sucralfate 1 g 4 times a day which he is not sure if it is helping or not.  Patient states he also tried over-the-counter Pepto-Bismol with no improvement. Patient states since I last saw him he has had about 3 single episodes of dark stool.  Patient reports the last dark stools about a week ago.      Patient reports he has continued with having issues with chronic constipation.  Patient was taking daily MiraLAX  without much improvement.  Patient states he would have about 1 bowel movement per week was which was a small amount.  Patient reports when he is more constipated he has bloating with some nausea.  He stopped the Mounjaro about 3 weeks ago due to side effects.   He is currently taking Plavix  75mg  po daily.  Wt Readings from Last 3 Encounters:  03/18/24 200 lb 8 oz (90.9 kg)  03/13/24 195 lb (88.5 kg)  02/02/24 199 lb (90.3 kg)    Past Medical History:  Diagnosis Date   Adenomatous colon polyp    Arthritis    CAD (coronary artery disease)    a. MI in 1996 with stent placement b. cath 01/18/17 - S/p PCI of in-stent restenosis of pLAD with cutting ballon & DES; 20% ostial LAD; 40% pro Cx; 60% focal pRCA   Clostridium difficile infection    Depression    Diverticulosis  H. pylori infection    HTN (hypertension)    Hyperlipidemia    MI (myocardial infarction) [facility])     Past Surgical History:  Procedure Laterality Date   BRAIN TUMOR EXCISION  1954   benign tumor in back of head, done at  [facility]   CARDIAC CATHETERIZATION     COLONOSCOPY     hx of polyps   CORONARY ANGIOPLASTY WITH STENT PLACEMENT  1996; 1997; 01/18/2017   CORONARY ATHERECTOMY N/A 02/28/2022   Procedure: CORONARY ATHERECTOMY;  Surgeon: Wendel Lurena POUR, MD;  Location: [facility] INVASIVE CV LAB;  Service: Cardiovascular;  Laterality: N/A;   CORONARY IMAGING/OCT N/A 02/28/2022    Procedure: INTRAVASCULAR IMAGING/OCT;  Surgeon: Wendel Lurena POUR, MD;  Location: [facility] INVASIVE CV LAB;  Service: Cardiovascular;  Laterality: N/A;   CORONARY LITHOTRIPSY N/A 02/28/2022   Procedure: CORONARY LITHOTRIPSY;  Surgeon: Wendel Lurena POUR, MD;  Location: [facility] INVASIVE CV LAB;  Service: Cardiovascular;  Laterality: N/A;   CORONARY PRESSURE/FFR STUDY N/A 07/28/2020   Procedure: INTRAVASCULAR PRESSURE WIRE/FFR STUDY;  Surgeon: Anner Alm ORN, MD;  Location: [facility] INVASIVE CV LAB;  Service: Cardiovascular;  Laterality: N/A;   CORONARY PRESSURE/FFR STUDY N/A 02/28/2022   Procedure: INTRAVASCULAR PRESSURE WIRE/FFR STUDY;  Surgeon: Wendel Lurena POUR, MD;  Location: [facility] INVASIVE CV LAB;  Service: Cardiovascular;  Laterality: N/A;   CORONARY STENT INTERVENTION N/A 01/18/2017   Procedure: CORONARY STENT INTERVENTION;  Surgeon: Burnard Debby LABOR, MD;  Location: [facility] INVASIVE CV LAB;  Service: Cardiovascular;  Laterality: N/A;   CORONARY STENT INTERVENTION N/A 02/28/2022   Procedure: CORONARY STENT INTERVENTION;  Surgeon: Wendel Lurena POUR, MD;  Location: [facility] INVASIVE CV LAB;  Service: Cardiovascular;  Laterality: N/A;   ESOPHAGOGASTRODUODENOSCOPY (EGD) WITH PROPOFOL  N/A 06/14/2018   Procedure: ESOPHAGOGASTRODUODENOSCOPY (EGD) WITH PROPOFOL ;  Surgeon: Avram Lupita BRAVO, MD;  Location: [facility] ENDOSCOPY;  Service: Endoscopy;  Laterality: N/A;   JOINT REPLACEMENT     LEFT HEART CATH AND CORONARY ANGIOGRAPHY N/A 01/18/2017   Procedure: LEFT HEART CATH AND CORONARY ANGIOGRAPHY;  Surgeon: Burnard Debby LABOR, MD;  Location: [facility] INVASIVE CV LAB;  Service: Cardiovascular;  Laterality: N/A;   LEFT HEART CATH AND CORONARY ANGIOGRAPHY N/A 07/28/2020   Procedure: LEFT HEART CATH AND CORONARY ANGIOGRAPHY;  Surgeon: Anner Alm ORN, MD;  Location: [facility] INVASIVE CV LAB;  Service: Cardiovascular;  Laterality: N/A;   RIGHT/LEFT HEART CATH AND CORONARY ANGIOGRAPHY N/A 02/28/2022   Procedure: RIGHT/LEFT HEART CATH AND CORONARY ANGIOGRAPHY;  Surgeon:  Wendel Lurena POUR, MD;  Location: [facility] INVASIVE CV LAB;  Service: Cardiovascular;  Laterality: N/A;   stent x2 in the LAD  with intra-aortic balloon pump support  1996   TONSILLECTOMY     TOTAL KNEE ARTHROPLASTY Bilateral     Current Outpatient Medications  Medication Sig Dispense Refill   celecoxib (CELEBREX) 100 MG capsule Take 100 mg by mouth daily.     clobetasol (TEMOVATE) 0.05 % external solution Apply 1 Application topically daily.     clopidogrel  (PLAVIX ) 75 MG tablet TAKE 1 TABLET BY MOUTH ONCE DAILY 30 tablet 9   docusate sodium  (COLACE) 100 MG capsule Take 1 capsule (100 mg total) by mouth 2 (two) times daily. 10 capsule 0   doxycycline  (VIBRA -TABS) 100 MG tablet Take 1 tablet (100 mg total) by mouth 2 (two) times daily. 20 tablet 0   escitalopram  (LEXAPRO ) 20 MG tablet Take 20 mg by mouth daily.     fenofibrate  (TRICOR ) 145 MG tablet TAKE ONE TABLET BY MOUTH ONCE DAILY 90 tablet  2   ketoconazole (NIZORAL) 2 % cream Apply 1 Application topically daily.     losartan  (COZAAR ) 25 MG tablet Take 1 tablet (25 mg total) by mouth daily. 30 tablet 0   methocarbamol  (ROBAXIN ) 500 MG tablet Take 1 tablet (500 mg total) by mouth every 8 (eight) hours as needed for muscle spasms (pain after rib fracture). 30 tablet 0   nitroGLYCERIN  (NITROSTAT ) 0.4 MG SL tablet PLACE 1 TABLET UNDER THE TONGUE EVERY 5 MINTUES AS NEEDED FOR CHEST PAIN 25 tablet 11   ondansetron  (ZOFRAN ) 4 MG tablet TAKE 1 TABLET BY MOUTH EVERY 12 HOURS AS NEEDED FOR NAUSEA OR VOMITING. 30 tablet 3   oxyCODONE -acetaminophen  (PERCOCET) 5-325 MG tablet Take 1 tablet by mouth every other day. (Patient taking differently: Take 1 tablet by mouth as needed.)     pantoprazole  (PROTONIX ) 40 MG tablet Take 1 tablet (40 mg total) by mouth 2 (two) times daily. 60 tablet 2   polyethylene glycol (MIRALAX  / GLYCOLAX ) 17 g packet Take 17 g by mouth daily as needed (constipation).     Potassium Chloride ER 20 MEQ TBCR Take 1 tablet by mouth daily.      REPATHA SURECLICK 140 MG/ML SOAJ Inject 1 mL into the skin every 14 (fourteen) days.     rosuvastatin  (CRESTOR ) 20 MG tablet TAKE ONE TABLET BY MOUTH ONCE DAILY 90 tablet 0   sertraline (ZOLOFT) 50 MG tablet Take 50 mg by mouth daily.     sucralfate (CARAFATE) 1 g tablet Take 1 tablet (1 g total) by mouth 4 (four) times daily -  with meals and at bedtime. 120 tablet 1   terbinafine  (LAMISIL ) 250 MG tablet Take 1 tablet (250 mg total) by mouth daily. 30 tablet 0   Torsemide  40 MG TABS Take 20 mg by mouth daily. Ok to take extra 20 mg if weight increases more than 3 lbs in 1 day.     Vilazodone HCl 20 MG TABS 1 tablet with food Orally Once a day for 30 days     acetaminophen  (TYLENOL ) 500 MG tablet Take 2 tablets (1,000 mg total) by mouth every 6 (six) hours. (Patient not taking: Reported on 03/18/2024) 30 tablet 0   No current facility-administered medications for this visit.    Allergies as of 03/18/2024 - Review Complete 03/18/2024  Allergen Reaction Noted   Gabapentin Other (See Comments) 09/16/2020    Family History  Problem Relation Age of Onset   Heart attack Mother        65s   Heart attack Brother 6   Cancer Sister        unsure of type   Colon cancer Neg Hx    Stomach cancer Neg Hx    Rectal cancer Neg Hx    Esophageal cancer Neg Hx     Review of Systems:    Constitutional: No weight loss, fever, chills, weakness or fatigue HEENT: Eyes: No change in vision               Ears, Nose, Throat:  No change in hearing or congestion Skin: No rash or itching Cardiovascular: No chest pain, chest pressure or palpitations   Respiratory: No SOB or cough Gastrointestinal: See HPI and otherwise negative Genitourinary: No dysuria or change in urinary frequency Neurological: No headache, dizziness or syncope Musculoskeletal: No new muscle or joint pain Hematologic: No bleeding or bruising Psychiatric: No history of depression or anxiety    Physical Exam:  Vital signs: BP  118/78 (BP  Location: Left Arm)   Pulse 78   Ht 5' 8 (1.727 m)   Wt 200 lb 8 oz (90.9 kg)   BMI 30.49 kg/m   Constitutional:   Pleasant male appears to be in NAD, Well developed, Well nourished, alert and cooperative Throat: Oral cavity and pharynx without inflammation, swelling or lesion.  Respiratory: Respirations even and unlabored. Lungs clear to auscultation bilaterally.   No wheezes, crackles, or rhonchi.  Cardiovascular: Normal S1, S2. Regular rate and rhythm. No peripheral edema, cyanosis or pallor.  Gastrointestinal:  Soft, nondistended, nontender. No rebound or guarding. Normal bowel sounds. No appreciable masses or hepatomegaly. Rectal:  Not performed.  Msk:  Symmetrical without gross deformities. Without edema, no deformity or joint abnormality.  Neurologic:  Alert and  oriented x4;  grossly normal neurologically.  Skin:   Dry and intact without significant lesions or rashes. Psychiatric: Oriented to person, place and time. Demonstrates good judgement and reason without abnormal affect or behaviors.  RELEVANT LABS AND IMAGING: CBC    Latest Ref Rng & Units 03/13/2024    4:48 PM 03/03/2024    6:50 PM 09/12/2023    6:47 AM  CBC  WBC 3.4 - 10.8 x10E3/uL 6.7  7.0  6.8   Hemoglobin 13.0 - 17.7 g/dL 83.8  84.2  85.0   Hematocrit 37.5 - 51.0 % 49.9  49.5  45.0   Platelets 150 - 450 x10E3/uL 254  248  191      CMP     Latest Ref Rng & Units 03/13/2024    4:48 PM 03/03/2024    6:50 PM 09/12/2023    6:47 AM  CMP  Glucose 70 - 99 mg/dL 97  899  815   BUN 8 - 27 mg/dL 14  14  13    Creatinine 0.76 - 1.27 mg/dL 8.74  8.68  8.99   Sodium 134 - 144 mmol/L 144  139  139   Potassium 3.5 - 5.2 mmol/L 4.3  4.1  4.3   Chloride 96 - 106 mmol/L 103  105  99   CO2 20 - 29 mmol/L 26  24  30    Calcium  8.6 - 10.2 mg/dL 9.7  8.8  9.3   Total Protein 6.5 - 8.1 g/dL  6.5    Total Bilirubin 0.0 - 1.2 mg/dL  1.2    Alkaline Phos 38 - 126 U/L  57    AST 15 - 41 U/L  21    ALT 0 - 44 U/L  14        Lab Results  Component Value Date   TSH 2.020 03/28/2023  12/04/23 diathereix h pylori stool  negative 11/29/2023 HG A1c 9.5  09/12/2023 echo-Left ventricular ejection fraction, by estimation, is 45 to 50%.  09/12/23 labs show: D-Dimer 0.86  12/15/22 labs show Ha1c 8.7 06/14/2018 EGD with Dr. Avram for coffee-ground emesis, melena in hospital - Multiple non- bleeding duodenal ulcers with pigmented material. - Erythematous mucosa in the prepyloric region of the stomach. - The examination was otherwise normal.  - No specimens collected. 08/01/2014 EGD with Dr. Teressa for nausea, dyspepsia, weight loss There was mild to moderate non- specific pan gastritis. This was biopsied distally and sent to pathology. There was a small amount of retained solid and liquid food in stomach. The z- line was slightly irregular, but there were no nodules and it looked unchanged from 2013 at which time biopsies showed no Barrett' s, biopsies were not repeated today. The examination was otherwise normal  Path:Diagnosis Surgical [P], distal gastric - CHRONIC GASTRITIS. - WARTHIN-STARRY STAIN NEGATIVE FOR HELICOBACTER PYLORI. - NO DYSPLASIA OR MALIGNANCY. 12/27/2011 EGD with Dr. Teressa for sore throat, dyspepsia Moderate gastritis, biopsied to check for H. pylori Irregular Z-line, biopsied to check for Barrett's Otherwise normal examination no clear acid reflux damage 09/16/2011 colonoscopy with Dr. Teressa, recall 5 years Mild diverticulosis in the sigmoid colon and descending colon segments Otherwise normal.  Assessment: Encounter Diagnoses  Name Primary?   Gastroesophageal reflux disease, unspecified whether esophagitis present Yes   Melena    Esophageal dysphagia       83 year old male patient who presents with worsening GERD and reported dark stools on 3 occasions over the last 6 weeks.  Patient does have history of gastric ulcers found on endoscopy back in January 2020.  Will have patient continue PPI  therapy twice daily.  Educated on no NSAIDs.  Hemoglobin stable 16.1.  Will go ahead and proceed with EGD with possible dilatation in [facility] with Dr. Charlanne. Patient also having issues with chronic constipation not relieved with over-the-counter MiraLAX .  Patient is on multiple medications that can exacerbate constipation.  Will go ahead and provide samples of pro secretory agent Linzess.    We revisited patient having colon screening colonoscopy.  Patient's last colonoscopy was back in 2013 with recommendations to repeat in 5 years which unfortunately was never done.  At the last appointment patient had had some recent cardiac issues and therefore we wanted to get cardiac clearance before proceeding.  Patient did see cardiology and was cleared and considered moderate risk.  Patient informs me today that he would like to hold off on the colonoscopy for now.   Plan: - Continue pantoprazole  40 mg po twice daily -Recommend GERD diet, no late meals -No NSAIDs. -Schedule EGD with possible dilatation with Dr. Charlanne. The risks and benefits of EGD with possible biopsies and esophageal dilation were discussed with the patient who agrees to proceed. -cardiac clearance for Plavix  -if negative exam with need to do esophagram with tablet  - samples linzess 72 mcg po daily  -we discussed colonoscopy, he would like to hold off  Thank you for the courtesy of this consult. Please call me with any questions or concerns.   Fabiola Mudgett, FNP-C [facility] 03/18/2024, 2:48 PM  Cc: Verdia Lombard, MD

## 2024-03-18 NOTE — Patient Instructions (Addendum)
 GERD Continue pantoprazole  40 mg po twice daily Recommend GERD diet, no late meals  Constipation Samples Linzess 72 mcg po daily, take 1 tablet 30-45 mins before first meal of the day with full glass of water.  Linzess works best when taken once a day every day, on an empty stomach, at least 30 minutes before your first meal of the day.  When Linzess is taken daily as directed:  *Constipation relief is typically felt in about a week *IBS-C patients may begin to experience relief from belly pain and overall abdominal symptoms (pain, discomfort, and bloating) in about 1 week,   with symptoms typically improving over 12 weeks.  Diarrhea may occur in the first 2 weeks -keep taking it.  The diarrhea should go away and you should start having normal, complete, full bowel movements. It may be helpful to start treatment when you can be near the comfort of your own bathroom, such as a weekend.   You have been scheduled for an endoscopy. Please follow written instructions given to you at your visit today.  If you use inhalers (even only as needed), please bring them with you on the day of your procedure.  If you take any of the following medications, they will need to be adjusted prior to your procedure:   DO NOT TAKE 7 DAYS PRIOR TO TEST- Trulicity (dulaglutide) Ozempic, Wegovy (semaglutide) Mounjaro (tirzepatide) Bydureon Bcise (exanatide extended release)  DO NOT TAKE 1 DAY PRIOR TO YOUR TEST Rybelsus (semaglutide) Adlyxin (lixisenatide) Victoza (liraglutide) Byetta (exanatide) ___________________________________________________________________________  _______________________________________________________  If your blood pressure at your visit was 140/90 or greater, please contact your primary care physician to follow up on this.  _______________________________________________________  If you are age 51 or older, your body mass index should be between 23-30. Your Body mass  index is 30.49 kg/m. If this is out of the aforementioned range listed, please consider follow up with your Primary Care Provider.  If you are age 15 or younger, your body mass index should be between 19-25. Your Body mass index is 30.49 kg/m. If this is out of the aformentioned range listed, please consider follow up with your Primary Care Provider.   ________________________________________________________  The [facility] providers would like to encourage you to use MYCHART to communicate with providers for non-urgent requests or questions.  Due to long hold times on the telephone, sending your provider a message by [facility] may be a faster and more efficient way to get a response.  Please allow 48 business hours for a response.  Please remember that this is for non-urgent requests.  _______________________________________________________  [facility] is using a team-based approach to care.  Your team is made up of your doctor and two to three APPS. Our APPS (Nurse Practitioners and Physician Assistants) work with your physician to ensure care continuity for you. They are fully qualified to address your health concerns and develop a treatment plan. They communicate directly with your gastroenterologist to care for you. Seeing the Advanced Practice Practitioners on your physician's team can help you by facilitating care more promptly, often allowing for earlier appointments, access to diagnostic testing, procedures, and other specialty referrals.

## 2024-03-19 NOTE — Telephone Encounter (Signed)
   Patient Name: [name]  DOB: 09-09-1940 MRN: [id number]  Primary Cardiologist: Emeline FORBES Calender, MD  Chart reviewed as part of pre-operative protocol coverage. Pre-op  clearance already addressed by colleagues in earlier phone notes. To summarize recommendations:  -Ok to hold Clopidogrel  and proceed with EGD. He was having severe GI symptoms when I saw him. If ok with GI, would have him take ASA 81 mg once daily while off Clopidogrel  and stop ASA once Clopidogrel  resumed. Glendia Ferrier, PA-C    03/19/2024 1:28 PM   Will route this bundled recommendation to requesting provider via Epic fax function and remove from pre-op  pool. Please call with questions.  Orren LOISE Fabry, PA-C 03/19/2024, 3:50 PM

## 2024-03-19 NOTE — Telephone Encounter (Signed)
 Ok to hold Clopidogrel  and proceed with EGD. He was having severe GI symptoms when I saw him. If ok with GI, would have him take ASA 81 mg once daily while off Clopidogrel  and stop ASA once Clopidogrel  resumed. Glendia Ferrier, PA-C    03/19/2024 1:28 PM

## 2024-03-20 ENCOUNTER — Telehealth: Payer: Self-pay

## 2024-03-20 NOTE — Telephone Encounter (Signed)
 Spoke to patient and said that per cardiology he could hold his Plavix  for 5 days prior to his procedure.  They requested that he take Aspirin  81mg  daily for those 5 days and stop when he resumed the Plavix .  Patient agreed.

## 2024-03-21 DIAGNOSIS — M545 Low back pain, unspecified: Secondary | ICD-10-CM | POA: Diagnosis not present

## 2024-03-21 DIAGNOSIS — R55 Syncope and collapse: Secondary | ICD-10-CM | POA: Diagnosis not present

## 2024-03-21 DIAGNOSIS — Z5181 Encounter for therapeutic drug level monitoring: Secondary | ICD-10-CM | POA: Diagnosis not present

## 2024-03-21 DIAGNOSIS — I2510 Atherosclerotic heart disease of native coronary artery without angina pectoris: Secondary | ICD-10-CM | POA: Diagnosis not present

## 2024-03-26 DIAGNOSIS — K921 Melena: Secondary | ICD-10-CM | POA: Diagnosis not present

## 2024-03-26 DIAGNOSIS — Z79899 Other long term (current) drug therapy: Secondary | ICD-10-CM | POA: Diagnosis not present

## 2024-03-26 DIAGNOSIS — I5032 Chronic diastolic (congestive) heart failure: Secondary | ICD-10-CM | POA: Diagnosis not present

## 2024-03-28 DIAGNOSIS — E782 Mixed hyperlipidemia: Secondary | ICD-10-CM | POA: Diagnosis not present

## 2024-03-28 DIAGNOSIS — I25118 Atherosclerotic heart disease of native coronary artery with other forms of angina pectoris: Secondary | ICD-10-CM | POA: Diagnosis not present

## 2024-03-28 DIAGNOSIS — E1122 Type 2 diabetes mellitus with diabetic chronic kidney disease: Secondary | ICD-10-CM | POA: Diagnosis not present

## 2024-03-28 DIAGNOSIS — Z79899 Other long term (current) drug therapy: Secondary | ICD-10-CM | POA: Diagnosis not present

## 2024-03-28 DIAGNOSIS — K921 Melena: Secondary | ICD-10-CM | POA: Diagnosis not present

## 2024-03-28 DIAGNOSIS — F339 Major depressive disorder, recurrent, unspecified: Secondary | ICD-10-CM | POA: Diagnosis not present

## 2024-03-29 ENCOUNTER — Telehealth: Payer: Self-pay | Admitting: *Deleted

## 2024-03-29 NOTE — Telephone Encounter (Signed)
 Called pt to inform him that per Anesthesia, he would need to have the Echocardiogram completed prior to EGD. Echo ordered by Cardiology for two syncopal episodes. Given pt's age and cardiac history, anesthesia is requiring the Echo prior to sedation at [facility]. RN was going to attempt to go ahead and reschedule EGD to have the pt an appointment booked for when the Echo was completed, in case Dr. Ira schedule filled up. Pt did not answer. Left pt a message to call back to reschedule.   Also an RN spoke with pt earlier when he was contacted about medications that were listed on outside med reconciliation. Pt verified that he is not taking Mounjaro, this was removed from his med list. Pt reported he was taking Metformin, this was added to his med list.

## 2024-04-01 NOTE — Telephone Encounter (Signed)
 Noted in chart that pt was scheduled for 04/29/2024 for an office visit with Dr. Charlanne.  OV canceled  Left message for pt to call back

## 2024-04-01 NOTE — Telephone Encounter (Signed)
 Patient is returning call. Requesting a call back to discuss further. Please advise, thank you

## 2024-04-01 NOTE — Telephone Encounter (Signed)
 Pt made aware that OV on the 04/29/2024 has been canceled. Pt verbalized understanding with all questions answered.

## 2024-04-01 NOTE — Telephone Encounter (Addendum)
 Spoke with pt.  Chart reviewed and noted that pt recent Procedure had been canceled due to anesthesia requesting Echo. Pt made aware.  Pt was rescheduled for 04/30/2024 at 9:00 AM. Pt made aware.  Updated prep instructions were created and sent to pt via mail. Pt made aware.  Reminder placed in Epic to follow up on the Echo on Nov. 19th. Pt made aware.  Pt verbalized understanding with all questions answered.

## 2024-04-02 ENCOUNTER — Ambulatory Visit: Admitting: Neurology

## 2024-04-02 ENCOUNTER — Encounter: Admitting: Gastroenterology

## 2024-04-03 DIAGNOSIS — Z961 Presence of intraocular lens: Secondary | ICD-10-CM | POA: Diagnosis not present

## 2024-04-03 DIAGNOSIS — G514 Facial myokymia: Secondary | ICD-10-CM | POA: Diagnosis not present

## 2024-04-03 DIAGNOSIS — H02889 Meibomian gland dysfunction of unspecified eye, unspecified eyelid: Secondary | ICD-10-CM | POA: Diagnosis not present

## 2024-04-04 DIAGNOSIS — E1122 Type 2 diabetes mellitus with diabetic chronic kidney disease: Secondary | ICD-10-CM | POA: Diagnosis not present

## 2024-04-04 DIAGNOSIS — I25118 Atherosclerotic heart disease of native coronary artery with other forms of angina pectoris: Secondary | ICD-10-CM | POA: Diagnosis not present

## 2024-04-04 DIAGNOSIS — E782 Mixed hyperlipidemia: Secondary | ICD-10-CM | POA: Diagnosis not present

## 2024-04-04 DIAGNOSIS — I5022 Chronic systolic (congestive) heart failure: Secondary | ICD-10-CM | POA: Diagnosis not present

## 2024-04-04 DIAGNOSIS — N1831 Chronic kidney disease, stage 3a: Secondary | ICD-10-CM | POA: Diagnosis not present

## 2024-04-04 DIAGNOSIS — I130 Hypertensive heart and chronic kidney disease with heart failure and stage 1 through stage 4 chronic kidney disease, or unspecified chronic kidney disease: Secondary | ICD-10-CM | POA: Diagnosis not present

## 2024-04-05 ENCOUNTER — Ambulatory Visit: Admitting: Physician Assistant

## 2024-04-10 ENCOUNTER — Encounter: Payer: Self-pay | Admitting: Physician Assistant

## 2024-04-10 ENCOUNTER — Ambulatory Visit: Payer: Self-pay | Admitting: Physician Assistant

## 2024-04-10 DIAGNOSIS — I2510 Atherosclerotic heart disease of native coronary artery without angina pectoris: Secondary | ICD-10-CM

## 2024-04-10 DIAGNOSIS — R55 Syncope and collapse: Secondary | ICD-10-CM

## 2024-04-10 DIAGNOSIS — I5032 Chronic diastolic (congestive) heart failure: Secondary | ICD-10-CM

## 2024-04-11 DIAGNOSIS — I5022 Chronic systolic (congestive) heart failure: Secondary | ICD-10-CM | POA: Diagnosis not present

## 2024-04-11 DIAGNOSIS — E782 Mixed hyperlipidemia: Secondary | ICD-10-CM | POA: Diagnosis not present

## 2024-04-11 DIAGNOSIS — N1831 Chronic kidney disease, stage 3a: Secondary | ICD-10-CM | POA: Diagnosis not present

## 2024-04-11 DIAGNOSIS — F323 Major depressive disorder, single episode, severe with psychotic features: Secondary | ICD-10-CM | POA: Diagnosis not present

## 2024-04-11 DIAGNOSIS — E1122 Type 2 diabetes mellitus with diabetic chronic kidney disease: Secondary | ICD-10-CM | POA: Diagnosis not present

## 2024-04-11 DIAGNOSIS — I130 Hypertensive heart and chronic kidney disease with heart failure and stage 1 through stage 4 chronic kidney disease, or unspecified chronic kidney disease: Secondary | ICD-10-CM | POA: Diagnosis not present

## 2024-04-11 DIAGNOSIS — I25118 Atherosclerotic heart disease of native coronary artery with other forms of angina pectoris: Secondary | ICD-10-CM | POA: Diagnosis not present

## 2024-04-12 ENCOUNTER — Encounter: Payer: Self-pay | Admitting: Neurology

## 2024-04-12 ENCOUNTER — Ambulatory Visit: Admitting: Neurology

## 2024-04-24 ENCOUNTER — Ambulatory Visit (HOSPITAL_COMMUNITY)
Admission: RE | Admit: 2024-04-24 | Discharge: 2024-04-24 | Disposition: A | Discharge status: Home / Self Care | Source: Ambulatory Visit | Attending: Cardiology | Admitting: Cardiology

## 2024-04-24 ENCOUNTER — Telehealth: Payer: Self-pay

## 2024-04-24 DIAGNOSIS — I2510 Atherosclerotic heart disease of native coronary artery without angina pectoris: Secondary | ICD-10-CM | POA: Diagnosis not present

## 2024-04-24 DIAGNOSIS — R55 Syncope and collapse: Secondary | ICD-10-CM | POA: Diagnosis not present

## 2024-04-24 DIAGNOSIS — I5032 Chronic diastolic (congestive) heart failure: Secondary | ICD-10-CM | POA: Diagnosis not present

## 2024-04-24 LAB — ECHOCARDIOGRAM COMPLETE
Area-P 1/2: 3.03 cm2
S' Lateral: 2.9 cm

## 2024-04-24 NOTE — Telephone Encounter (Signed)
 Chart reviewed. Pt scheduled for Echo today.  Medical/ Pharmacy clearance sent via epic.

## 2024-04-24 NOTE — Telephone Encounter (Signed)
 Received fax from pt cardiologist Dr. Kriste with [facility] stating that the pt was cleared for the EGD and recommendations for holding plavix .  Pt was left a detail voice message to hold plavix  for 5 days prior to procedure and take Asprin 81 mg while holding Plavix .  Requested pt to call back.

## 2024-04-24 NOTE — Telephone Encounter (Signed)
 [facility] Pre-operative Risk Assessment     Request for surgical clearance:     Endoscopy Procedure  What type of surgery is being performed?     Upper Endoscopy  When is this surgery scheduled?     04/30/2024   What type of clearance is required ?   Pharmacy/Medical  Are there any medications that need to be held prior to surgery and how long? Plavix  5 days  Practice name and name of physician performing surgery?      [facility]/ Dr. Charlanne  What is your office phone and fax number?      Phone- [phone]  Fax- [phone]  Anesthesia type (None, local, MAC, general) ?       MAC   Please route your response to Elspeth Munroe RN

## 2024-04-25 NOTE — Telephone Encounter (Signed)
 Fax received from cardiology. Pt is aware of medication hold. Refer to phone note 04/24/24.

## 2024-04-25 NOTE — Telephone Encounter (Signed)
 Spoke with patient & he received Steven's message yesterday and has no further questions.

## 2024-04-29 ENCOUNTER — Ambulatory Visit: Admitting: Gastroenterology

## 2024-04-30 ENCOUNTER — Encounter: Payer: Self-pay | Admitting: Gastroenterology

## 2024-04-30 ENCOUNTER — Ambulatory Visit: Admitting: Gastroenterology

## 2024-04-30 VITALS — BP 129/83 | HR 59 | Temp 97.7°F | Resp 13 | Ht 68.0 in | Wt 200.0 lb

## 2024-04-30 DIAGNOSIS — K2950 Unspecified chronic gastritis without bleeding: Secondary | ICD-10-CM

## 2024-04-30 DIAGNOSIS — K219 Gastro-esophageal reflux disease without esophagitis: Secondary | ICD-10-CM | POA: Diagnosis not present

## 2024-04-30 DIAGNOSIS — K2970 Gastritis, unspecified, without bleeding: Secondary | ICD-10-CM

## 2024-04-30 DIAGNOSIS — K5710 Diverticulosis of small intestine without perforation or abscess without bleeding: Secondary | ICD-10-CM

## 2024-04-30 DIAGNOSIS — K2100 Gastro-esophageal reflux disease with esophagitis, without bleeding: Secondary | ICD-10-CM | POA: Diagnosis not present

## 2024-04-30 DIAGNOSIS — I2510 Atherosclerotic heart disease of native coronary artery without angina pectoris: Secondary | ICD-10-CM | POA: Diagnosis not present

## 2024-04-30 DIAGNOSIS — K2951 Unspecified chronic gastritis with bleeding: Secondary | ICD-10-CM | POA: Diagnosis not present

## 2024-04-30 DIAGNOSIS — K921 Melena: Secondary | ICD-10-CM | POA: Diagnosis not present

## 2024-04-30 DIAGNOSIS — I252 Old myocardial infarction: Secondary | ICD-10-CM | POA: Diagnosis not present

## 2024-04-30 DIAGNOSIS — K2990 Gastroduodenitis, unspecified, without bleeding: Secondary | ICD-10-CM

## 2024-04-30 DIAGNOSIS — I10 Essential (primary) hypertension: Secondary | ICD-10-CM | POA: Diagnosis not present

## 2024-04-30 DIAGNOSIS — R1319 Other dysphagia: Secondary | ICD-10-CM

## 2024-04-30 DIAGNOSIS — F32A Depression, unspecified: Secondary | ICD-10-CM | POA: Diagnosis not present

## 2024-04-30 DIAGNOSIS — R1013 Epigastric pain: Secondary | ICD-10-CM | POA: Diagnosis not present

## 2024-04-30 MED ORDER — SODIUM CHLORIDE 0.9 % IV SOLN: 500.0000 mL | INTRAVENOUS | Status: DC

## 2024-04-30 MED ORDER — DEXTROSE 5 % IV SOLN: INTRAVENOUS | Status: AC

## 2024-04-30 NOTE — Patient Instructions (Addendum)
 Handouts provided on gastritis and esophagitis.  Resume previous diet.  Continue present medications. Must take Protonix  (pantoprazole ) 40mg  by mouth twice daily.  Await pathology results.  If still with globus sensation, would recommend ENT consultation.  Can resume Plavix  on 05/02/24 at previous does.  No aspirin , ibuprofen, naproxen, or other non-steriodal anti-inflammatory drugs (NSAIDs). You may use Tylenol  if needed for mild pain or fever.    YOU HAD AN ENDOSCOPIC PROCEDURE TODAY AT THE [facility]:   Refer to the procedure report that was given to you for any specific questions about what was found during the examination.  If the procedure report does not answer your questions, please call your gastroenterologist to clarify.  If you requested that your care partner not be given the details of your procedure findings, then the procedure report has been included in a sealed envelope for you to review at your convenience later.  YOU SHOULD EXPECT: Some feelings of bloating in the abdomen. Passage of more gas than usual.  Walking can help get rid of the air that was put into your GI tract during the procedure and reduce the bloating. If you had a lower endoscopy (such as a colonoscopy or flexible sigmoidoscopy) you may notice spotting of blood in your stool or on the toilet paper. If you underwent a bowel prep for your procedure, you may not have a normal bowel movement for a few days.  Please Note:  You might notice some irritation and congestion in your nose or some drainage.  This is from the oxygen used during your procedure.  There is no need for concern and it should clear up in a day or so.  SYMPTOMS TO REPORT IMMEDIATELY:  Following upper endoscopy (EGD)  Vomiting of blood or coffee ground material  New chest pain or pain under the shoulder blades  Painful or persistently difficult swallowing  New shortness of breath  Fever of 100F or higher  Black, tarry-looking  stools  For urgent or emergent issues, a gastroenterologist can be reached at any hour by calling [phone]. Do not use MyChart messaging for urgent concerns.    DIET:  We do recommend a small meal at first, but then you may proceed to your regular diet.  Drink plenty of fluids but you should avoid alcoholic beverages for 24 hours.  ACTIVITY:  You should plan to take it easy for the rest of today and you should NOT DRIVE or use heavy machinery until tomorrow (because of the sedation medicines used during the test).    FOLLOW UP: Our staff will call the number listed on your records the next business day following your procedure.  We will call around 7:15- 8:00 am to check on you and address any questions or concerns that you may have regarding the information given to you following your procedure. If we do not reach you, we will leave a message.     If any biopsies were taken you will be contacted by phone or by letter within the next 1-3 weeks.  Please call us  at [phone] if you have not heard about the biopsies in 3 weeks.    SIGNATURES/CONFIDENTIALITY: You and/or your care partner have signed paperwork which will be entered into your electronic medical record.  These signatures attest to the fact that that the information above on your After Visit Summary has been reviewed and is understood.  Full responsibility of the confidentiality of this discharge information lies with you and/or your  care-partner.

## 2024-04-30 NOTE — Progress Notes (Signed)
 Pt's states no medical or surgical changes since previsit or office visit.

## 2024-04-30 NOTE — Progress Notes (Signed)
 Sedate, gd SR, tolerated procedure well, VSS, report to RN

## 2024-04-30 NOTE — Op Note (Signed)
 [facility] Patient Name: [name] Procedure Date: 04/30/2024 9:44 AM MRN: [id number] Endoscopist: Lynnie Bring , MD, [id number] Age: 83 Referring MD:  Date of Birth: 05-18-1941 Gender: Male Account #: [id number] Procedure:                Upper GI endoscopy Indications:              Epigastric abdominal pain. GERD. H/O Globus                            senstaion. ?melena. Medicines:                Monitored Anesthesia Care Procedure:                Pre-Anesthesia Assessment:                           - Prior to the procedure, a History and Physical                            was performed, and patient medications and                            allergies were reviewed. The patient's tolerance of                            previous anesthesia was also reviewed. The risks                            and benefits of the procedure and the sedation                            options and risks were discussed with the patient.                            All questions were answered, and informed consent                            was obtained. Prior Anticoagulants: The patient has                            taken Plavix  (clopidogrel ), last dose was 5 days                            prior to procedure. ASA Grade Assessment: III - A                            patient with severe systemic disease. After                            reviewing the risks and benefits, the patient was                            deemed in satisfactory condition to undergo the  procedure.                           After obtaining informed consent, the endoscope was                            passed under direct vision. Throughout the                            procedure, the patient's blood pressure, pulse, and                            oxygen saturations were monitored continuously. The                            GIF [id number] #[id number] was introduced through the                             mouth, and advanced to the second part of duodenum.                            The upper GI endoscopy was accomplished without                            difficulty. The patient tolerated the procedure                            well. Scope In: Scope Out: Findings:                 LA Grade B (one or more mucosal breaks greater than                            5 mm, not extending between the tops of two mucosal                            folds) esophagitis with no bleeding was found 40 cm                            from the incisors.                           Diffuse moderate inflammation characterized by                            congestion (edema) and erythema was found in the                            gastric body and in the gastric antrum. Biopsies                            were taken with a cold forceps for histology.                           A medium amount of  food (residue) was found in the                            cardia and in the gastric fundus.                           Localized mild inflammation characterized by                            erosions and erythema was found in the duodenal                            bulb and in the first portion of the duodenum.                           A medium non-bleeding diverticulum was found in the                            second portion of the duodenum. Complications:            No immediate complications. Estimated Blood Loss:     Estimated blood loss: none. Impression:               - LA Grade B reflux esophagitis with no bleeding.                           - Mod gastroduodenitis.                           - A medium amount of food (residue) in the stomach.                           - Non-bleeding duodenal diverticulum.                           - No active bleeding or any etiology of melena. Recommendation:           - Patient has a contact number available for                            emergencies. The signs and symptoms  of potential                            delayed complications were discussed with the                            patient. Return to normal activities tomorrow.                            Written discharge instructions were provided to the                            patient.                           - Resume previous diet.                           -  Continue present medications. Must take protonix                             40mg  po BID.                           - Await pathology results.                           - If still with globus sensation, would recommend                            ENT consultation.                           - Can resume Plavix  for 11/27                           - No aspirin , ibuprofen, naproxen, or other                            non-steroidal anti-inflammatory drugs.                           - The findings and recommendations were discussed                            with the patient's family. Lynnie Bring, MD 04/30/2024 10:21:41 AM This report has been signed electronically.

## 2024-04-30 NOTE — Progress Notes (Signed)
 Chief Complaint: follow-up constipation, discuss colon Primary GI Doctor: (previously Dr. Teressa) Dr. Charlanne   HPI:  Patient is a  83  year old male patient with past medical history of CAD s/p DES to pLAD 02/28/22, HFpEF, HLD, who was self referred to me by Glendia Ferrier, PA-C for worsening GERD and melena.  Last seen in GI office by myself on 02/02/24   12/01/2023 patient seen by cardiology for clearance for colonoscopy.  Patient considered moderate risk.  Okay to hold Plavix  for 5 days.   03/03/24 seen in ED for GERD and chest discomfort. Recently started on Monjaro about 3 days before the symptoms started. Serial troponins and EKG were performed and patient was observed for total of 10 hours in the ER with complete resolution of symptoms after medications. This constellation of presentation makes cardiac disease considered much less likely. X-ray without focal pneumonia pneumothorax or other clear pathology. Will increase his Protonix  to twice daily as it appears this was the last gastroenterology recommendation though he has not taken action on it yet.    03/13/24 visit with cardiology for follow up of CAD, CHF. He was seen in the ED 03/03/24 for chest pain associated with meals. Troponins were neg x 2. EKG showed Right Bundle Branch Block, LAFB and no change from prior. CXR was unremarkable. He was given GI meds and felt better. EDP notes reviewed. Plan is to follow up with GI. He experiences severe indigestion. The indigestion is persistent and severe. Eating sometimes worsens the symptoms, but cool liquid seems to help temporarily. He reports black stools occurring three to four days ago, which have since returned to normal color.    Interval History    Patient presents for follow-up for evaluation of worsening GERD and reported melena.  Patient reports he feels as if he has a lump in the back of his throat he cannot clear.  Patient has started the PPI therapy twice daily since he saw the  cardiologist last week.  Patient states he noticed some improvement.  Patient also complains of burning in his throat and chest.  Patient was also started on sucralfate  1 g 4 times a day which he is not sure if it is helping or not.  Patient states he also tried over-the-counter Pepto-Bismol with no improvement. Patient states since I last saw him he has had about 3 single episodes of dark stool.  Patient reports the last dark stools about a week ago.      Patient reports he has continued with having issues with chronic constipation.  Patient was taking daily MiraLAX  without much improvement.  Patient states he would have about 1 bowel movement per week was which was a small amount.  Patient reports when he is more constipated he has bloating with some nausea.   He stopped the Mounjaro about 3 weeks ago due to side effects.    He is currently taking Plavix  75mg  po daily.      Wt Readings from Last 3 Encounters:  03/18/24 200 lb 8 oz (90.9 kg)  03/13/24 195 lb (88.5 kg)  02/02/24 199 lb (90.3 kg)        Past Medical History:  Diagnosis Date   Adenomatous colon polyp     Arthritis     CAD (coronary artery disease)      a. MI in 1996 with stent placement b. cath 01/18/17 - S/p PCI of in-stent restenosis of pLAD with cutting ballon & DES; 20% ostial LAD;  40% pro Cx; 60% focal pRCA   Clostridium difficile infection     Depression     Diverticulosis     H. pylori infection     HTN (hypertension)     Hyperlipidemia     MI (myocardial infarction) [facility])                 Past Surgical History:  Procedure Laterality Date   BRAIN TUMOR EXCISION   1954    benign tumor in back of head, done at  [facility]   CARDIAC CATHETERIZATION       COLONOSCOPY        hx of polyps   CORONARY ANGIOPLASTY WITH STENT PLACEMENT   1996; 1997; 01/18/2017   CORONARY ATHERECTOMY N/A 02/28/2022    Procedure: CORONARY ATHERECTOMY;  Surgeon: Wendel Lurena POUR, MD;  Location: [facility] INVASIVE CV LAB;  Service: Cardiovascular;   Laterality: N/A;   CORONARY IMAGING/OCT N/A 02/28/2022    Procedure: INTRAVASCULAR IMAGING/OCT;  Surgeon: Wendel Lurena POUR, MD;  Location: [facility] INVASIVE CV LAB;  Service: Cardiovascular;  Laterality: N/A;   CORONARY LITHOTRIPSY N/A 02/28/2022    Procedure: CORONARY LITHOTRIPSY;  Surgeon: Wendel Lurena POUR, MD;  Location: [facility] INVASIVE CV LAB;  Service: Cardiovascular;  Laterality: N/A;   CORONARY PRESSURE/FFR STUDY N/A 07/28/2020    Procedure: INTRAVASCULAR PRESSURE WIRE/FFR STUDY;  Surgeon: Anner Alm ORN, MD;  Location: [facility] INVASIVE CV LAB;  Service: Cardiovascular;  Laterality: N/A;   CORONARY PRESSURE/FFR STUDY N/A 02/28/2022    Procedure: INTRAVASCULAR PRESSURE WIRE/FFR STUDY;  Surgeon: Wendel Lurena POUR, MD;  Location: [facility] CV [facility];  Service: Cardiovascular;  Laterality: N/A;   CORONARY STENT INTERVENTION N/A 01/18/2017    Procedure: CORONARY STENT INTERVENTION;  Surgeon: Burnard Debby LABOR, MD;  Location: [facility];  Service: Cardiovascular;  Laterality: N/A;   CORONARY STENT INTERVENTION N/A 02/28/2022    Procedure: CORONARY STENT INTERVENTION;  Surgeon: Wendel Lurena POUR, MD;  Location: [facility] CV [facility];  Service: Cardiovascular;  Laterality: N/A;   ESOPHAGOGASTRODUODENOSCOPY (EGD) WITH PROPOFOL  N/A 06/14/2018    Procedure: ESOPHAGOGASTRODUODENOSCOPY (EGD) WITH PROPOFOL ;  Surgeon: Avram Lupita BRAVO, MD;  Location: [facility] ENDOSCOPY;  Service: Endoscopy;  Laterality: N/A;   JOINT REPLACEMENT       LEFT HEART CATH AND CORONARY ANGIOGRAPHY N/A 01/18/2017    Procedure: LEFT HEART CATH AND CORONARY ANGIOGRAPHY;  Surgeon: Burnard Debby LABOR, MD;  Location: [facility] CV [facility];  Service: Cardiovascular;  Laterality: N/A;   LEFT HEART CATH AND CORONARY ANGIOGRAPHY N/A 07/28/2020    Procedure: LEFT HEART CATH AND CORONARY ANGIOGRAPHY;  Surgeon: Anner Alm ORN, MD;  Location: [facility] INVASIVE CV LAB;  Service: Cardiovascular;  Laterality: N/A;   RIGHT/LEFT HEART CATH AND CORONARY ANGIOGRAPHY N/A 02/28/2022     Procedure: RIGHT/LEFT HEART CATH AND CORONARY ANGIOGRAPHY;  Surgeon: Wendel Lurena POUR, MD;  Location: [facility] INVASIVE CV LAB;  Service: Cardiovascular;  Laterality: N/A;   stent x2 in the LAD  with intra-aortic balloon pump support   1996   TONSILLECTOMY       TOTAL KNEE ARTHROPLASTY Bilateral                  Current Outpatient Medications  Medication Sig Dispense Refill   celecoxib (CELEBREX) 100 MG capsule Take 100 mg by mouth daily.       clobetasol (TEMOVATE) 0.05 % external solution Apply 1 Application topically daily.       clopidogrel  (PLAVIX ) 75 MG tablet TAKE 1 TABLET BY MOUTH  ONCE DAILY 30 tablet 9   docusate sodium  (COLACE) 100 MG capsule Take 1 capsule (100 mg total) by mouth 2 (two) times daily. 10 capsule 0   doxycycline  (VIBRA -TABS) 100 MG tablet Take 1 tablet (100 mg total) by mouth 2 (two) times daily. 20 tablet 0   escitalopram  (LEXAPRO ) 20 MG tablet Take 20 mg by mouth daily.       fenofibrate  (TRICOR ) 145 MG tablet TAKE ONE TABLET BY MOUTH ONCE DAILY 90 tablet 2   ketoconazole (NIZORAL) 2 % cream Apply 1 Application topically daily.       losartan  (COZAAR ) 25 MG tablet Take 1 tablet (25 mg total) by mouth daily. 30 tablet 0   methocarbamol  (ROBAXIN ) 500 MG tablet Take 1 tablet (500 mg total) by mouth every 8 (eight) hours as needed for muscle spasms (pain after rib fracture). 30 tablet 0   nitroGLYCERIN  (NITROSTAT ) 0.4 MG SL tablet PLACE 1 TABLET UNDER THE TONGUE EVERY 5 MINTUES AS NEEDED FOR CHEST PAIN 25 tablet 11   ondansetron  (ZOFRAN ) 4 MG tablet TAKE 1 TABLET BY MOUTH EVERY 12 HOURS AS NEEDED FOR NAUSEA OR VOMITING. 30 tablet 3   oxyCODONE -acetaminophen  (PERCOCET) 5-325 MG tablet Take 1 tablet by mouth every other day. (Patient taking differently: Take 1 tablet by mouth as needed.)       pantoprazole  (PROTONIX ) 40 MG tablet Take 1 tablet (40 mg total) by mouth 2 (two) times daily. 60 tablet 2   polyethylene glycol (MIRALAX  / GLYCOLAX ) 17 g packet Take 17 g by mouth  daily as needed (constipation).       Potassium Chloride ER 20 MEQ TBCR Take 1 tablet by mouth daily.       REPATHA SURECLICK 140 MG/ML SOAJ Inject 1 mL into the skin every 14 (fourteen) days.       rosuvastatin  (CRESTOR ) 20 MG tablet TAKE ONE TABLET BY MOUTH ONCE DAILY 90 tablet 0   sertraline (ZOLOFT) 50 MG tablet Take 50 mg by mouth daily.       sucralfate  (CARAFATE ) 1 g tablet Take 1 tablet (1 g total) by mouth 4 (four) times daily -  with meals and at bedtime. 120 tablet 1   terbinafine  (LAMISIL ) 250 MG tablet Take 1 tablet (250 mg total) by mouth daily. 30 tablet 0   Torsemide  40 MG TABS Take 20 mg by mouth daily. Ok to take extra 20 mg if weight increases more than 3 lbs in 1 day.       Vilazodone HCl 20 MG TABS 1 tablet with food Orally Once a day for 30 days       acetaminophen  (TYLENOL ) 500 MG tablet Take 2 tablets (1,000 mg total) by mouth every 6 (six) hours. (Patient not taking: Reported on 03/18/2024) 30 tablet 0      No current facility-administered medications for this visit.             Allergies as of 03/18/2024 - Review Complete 03/18/2024  Allergen Reaction Noted   Gabapentin Other (See Comments) 09/16/2020           Family History  Problem Relation Age of Onset   Heart attack Mother          83s   Heart attack Brother 35   Cancer Sister          unsure of type   Colon cancer Neg Hx     Stomach cancer Neg Hx     Rectal cancer Neg Hx  Esophageal cancer Neg Hx            Review of Systems:    Constitutional: No weight loss, fever, chills, weakness or fatigue HEENT: Eyes: No change in vision               Ears, Nose, Throat:  No change in hearing or congestion Skin: No rash or itching Cardiovascular: No chest pain, chest pressure or palpitations   Respiratory: No SOB or cough Gastrointestinal: See HPI and otherwise negative Genitourinary: No dysuria or change in urinary frequency Neurological: No headache, dizziness or syncope Musculoskeletal: No  new muscle or joint pain Hematologic: No bleeding or bruising Psychiatric: No history of depression or anxiety      Physical Exam:  Vital signs: BP 118/78 (BP Location: Left Arm)   Pulse 78   Ht 5' 8 (1.727 m)   Wt 200 lb 8 oz (90.9 kg)   BMI 30.49 kg/m    Constitutional:   Pleasant male appears to be in NAD, Well developed, Well nourished, alert and cooperative Throat: Oral cavity and pharynx without inflammation, swelling or lesion.  Respiratory: Respirations even and unlabored. Lungs clear to auscultation bilaterally.   No wheezes, crackles, or rhonchi.  Cardiovascular: Normal S1, S2. Regular rate and rhythm. No peripheral edema, cyanosis or pallor.  Gastrointestinal:  Soft, nondistended, nontender. No rebound or guarding. Normal bowel sounds. No appreciable masses or hepatomegaly. Rectal:  Not performed.  Msk:  Symmetrical without gross deformities. Without edema, no deformity or joint abnormality.  Neurologic:  Alert and  oriented x4;  grossly normal neurologically.  Skin:   Dry and intact without significant lesions or rashes. Psychiatric: Oriented to person, place and time. Demonstrates good judgement and reason without abnormal affect or behaviors.   RELEVANT LABS AND IMAGING: CBC     Latest Ref Rng & Units 03/13/2024    4:48 PM 03/03/2024    6:50 PM 09/12/2023    6:47 AM  CBC  WBC 3.4 - 10.8 x10E3/uL 6.7  7.0  6.8   Hemoglobin 13.0 - 17.7 g/dL 83.8  84.2  85.0   Hematocrit 37.5 - 51.0 % 49.9  49.5  45.0   Platelets 150 - 450 x10E3/uL 254  248  191       CMP         Latest Ref Rng & Units 03/13/2024    4:48 PM 03/03/2024    6:50 PM 09/12/2023    6:47 AM  CMP  Glucose 70 - 99 mg/dL 97  899  815   BUN 8 - 27 mg/dL 14  14  13    Creatinine 0.76 - 1.27 mg/dL 8.74  8.68  8.99   Sodium 134 - 144 mmol/L 144  139  139   Potassium 3.5 - 5.2 mmol/L 4.3  4.1  4.3   Chloride 96 - 106 mmol/L 103  105  99   CO2 20 - 29 mmol/L 26  24  30    Calcium  8.6 - 10.2 mg/dL 9.7  8.8  9.3    Total Protein 6.5 - 8.1 g/dL   6.5     Total Bilirubin 0.0 - 1.2 mg/dL   1.2     Alkaline Phos 38 - 126 U/L   57     AST 15 - 41 U/L   21     ALT 0 - 44 U/L   14         Recent Labs       Lab Results  Component Value Date    TSH 2.020 03/28/2023    12/04/23 diathereix h pylori stool  negative 11/29/2023 HG A1c 9.5   09/12/2023 echo-Left ventricular ejection fraction, by estimation, is 45 to 50%.  09/12/23 labs show: D-Dimer 0.86  12/15/22 labs show Ha1c 8.7 06/14/2018 EGD with Dr. Avram for coffee-ground emesis, melena in hospital - Multiple non- bleeding duodenal ulcers with pigmented material. - Erythematous mucosa in the prepyloric region of the stomach. - The examination was otherwise normal.  - No specimens collected. 08/01/2014 EGD with Dr. Teressa for nausea, dyspepsia, weight loss There was mild to moderate non- specific pan gastritis. This was biopsied distally and sent to pathology. There was a small amount of retained solid and liquid food in stomach. The z- line was slightly irregular, but there were no nodules and it looked unchanged from 2013 at which time biopsies showed no Barrett' s, biopsies were not repeated today. The examination was otherwise normal Path:Diagnosis Surgical [P], distal gastric - CHRONIC GASTRITIS. - WARTHIN-STARRY STAIN NEGATIVE FOR HELICOBACTER PYLORI. - NO DYSPLASIA OR MALIGNANCY. 12/27/2011 EGD with Dr. Teressa for sore throat, dyspepsia Moderate gastritis, biopsied to check for H. pylori Irregular Z-line, biopsied to check for Barrett's Otherwise normal examination no clear acid reflux damage 09/16/2011 colonoscopy with Dr. Teressa, recall 5 years Mild diverticulosis in the sigmoid colon and descending colon segments Otherwise normal.   Assessment:     Encounter Diagnoses  Name Primary?   Gastroesophageal reflux disease, unspecified whether esophagitis present Yes   Melena     Esophageal dysphagia        83 year old male patient who presents  with worsening GERD and reported dark stools on 3 occasions over the last 6 weeks.  Patient does have history of gastric ulcers found on endoscopy back in January 2020.  Will have patient continue PPI therapy twice daily.  Educated on no NSAIDs.  Hemoglobin stable 16.1.  Will go ahead and proceed with EGD with possible dilatation in [facility] with Dr. Charlanne. Patient also having issues with chronic constipation not relieved with over-the-counter MiraLAX .  Patient is on multiple medications that can exacerbate constipation.  Will go ahead and provide samples of pro secretory agent Linzess.    We revisited patient having colon screening colonoscopy.  Patient's last colonoscopy was back in 2013 with recommendations to repeat in 5 years which unfortunately was never done.  At the last appointment patient had had some recent cardiac issues and therefore we wanted to get cardiac clearance before proceeding.  Patient did see cardiology and was cleared and considered moderate risk.  Patient informs me today that he would like to hold off on the colonoscopy for now.   Plan: - Continue pantoprazole  40 mg po twice daily -Recommend GERD diet, no late meals -No NSAIDs. -Schedule EGD with possible dilatation with Dr. Charlanne. The risks and benefits of EGD with possible biopsies and esophageal dilation were discussed with the patient who agrees to proceed. -cardiac clearance for Plavix  -if negative exam with need to do esophagram with tablet  - samples linzess 72 mcg po daily  -we discussed colonoscopy, he would like to hold off   Thank you for the courtesy of this consult. Please call me with any questions or concerns.    Deanna May, FNP-C

## 2024-04-30 NOTE — Progress Notes (Signed)
 Called to room to assist during endoscopic procedure.  Patient ID and intended procedure confirmed with present staff. Received instructions for my participation in the procedure from the performing physician.

## 2024-05-01 ENCOUNTER — Telehealth: Payer: Self-pay

## 2024-05-01 NOTE — Telephone Encounter (Signed)
  Follow up Call-     04/30/2024    9:17 AM  Call back number  Post procedure Call Back phone  # [phone]  Permission to leave phone message Yes     Patient questions:  Do you have a fever, pain , or abdominal swelling? No. Pain Score  0 *  Have you tolerated food without any problems? Yes.    Have you been able to return to your normal activities? Yes.    Do you have any questions about your discharge instructions: Diet   No. Medications  No. Follow up visit  No.  Do you have questions or concerns about your Care? No.  Actions: * If pain score is 4 or above: No action needed, pain <4.

## 2024-05-03 LAB — SURGICAL PATHOLOGY

## 2024-05-06 ENCOUNTER — Ambulatory Visit: Payer: Self-pay | Admitting: Gastroenterology

## 2024-05-10 DIAGNOSIS — J029 Acute pharyngitis, unspecified: Secondary | ICD-10-CM | POA: Diagnosis not present

## 2024-05-13 NOTE — Progress Notes (Deleted)
 {This patient may be at risk for Amyloid. He has one or more dx on the prob list or PMH from the following list -  Abnormal EKG, HFpEF/Diastolic CHF, Aortic Stenosis, LVH, Bilateral Carpal Tunnel Syndrome, Biceps Tendon Rupture, Spinal Stenosis, Pericardial Effusion, Left Atrial Enlargement, Conduction System Disorder. See list below or review PMH.  Diagnoses From Problem List           Noted     Acute on chronic combined systolic and diastolic CHF (congestive heart failure) (HCC) Unknown     Chronic diastolic CHF (congestive heart failure) (HCC) Unknown     Chronic heart failure with preserved ejection fraction (HFpEF) (HCC) 09/12/2023    Click HERE to open Cardiac Amyloid Screening SmartSet to order screening OR Click HERE to defer testing for 1 year or permanently :1}     OFFICE NOTE:    Date:  05/13/2024  ID:  [name], DOB 1940/06/25, MRN [id number] PCP: Verdia Lombard, MD  [facility] Providers Cardiologist:  Emeline FORBES Calender, DO Cardiology APP:  Lelon Glendia DASEN, PA-C { Click to update primary MD,subspecialty MD or APP then REFRESH:1}      *** Coronary artery disease  S/p MI in 1996 tx with stent S/p DES to LAD in 2018 Cath 07/2020: LAD stent patent; mod non-obs dz in LCx, RCA, dLAD (neg RFR) >> Med Rx S/p PCI 02/28/22 (OCT guided PCI w shockwave lithotripsy, orbital atherectomy, 3 x 12 mm DES) to ostial LAD LHC 02/28/2022: Proximal LAD stent patent, mid to distal LAD 45, distal LAD 55, 50 (suspect microvascular ischemia in apical LAD), proximal LCx 55, proximal RCA 50, mid to distal RCA 15 (HFpEF) heart failure with preserved ejection fraction  Echocardiogram 08/24/2020: EF 55-60, no RWMA, GR 1 DD, normal RVSF, moderate LAE, trivial MR, AV sclerosis without stenosis  TTE 09/12/2023: EF 45-50, mild LVH, G1 DD, normal RVSF TTE 04/24/2024: EF 45-50, GR 1 DD, normal RVSF, trivial MR Supraventricular Tachycardia Monitor 01/2021: 21 SVT runs (longest 11 beats), PACs/PVCs  <1% Syncope Monitor 03/2024: NSR, 23 SVT runs (asymptomatic), longest 19 beats, 2.3% PVCs Hypertension  Hyperlipidemia Hx of GI bleed GERD         Discussed the use of AI scribe software for clinical note transcription with the patient, who gave verbal consent to proceed. History of Present Illness [name] is a 83 y.o. male for follow up of CAD, CHF. Last seen in 03/2024. He had been to the ED in 02/2024 for chest pain assoc w meals. When last seen, he continued to have severe, persistent acid reflux disease symptoms. Pt also noted symptoms of orthostatic syncope. I recommended compression hose. Hgb was normal on CBC.  EGD performed by gastroenterology demonstrated reflux esophagitis and gastroduodenitis.  He was continued on PPI therapy.  Echocardiogram demonstrated stable ejection fraction of 45-50.  Follow-up monitor demonstrated 23 brief runs of supraventricular tachycardia, 2.3% PVCs.  There were no significant arrhythmias that would contribute to syncope.    ROS-See HPI***    Studies Reviewed:       LABS 03/13/2024: K 4.3, creatinine 1.25, eGFR 58, Hgb 16.1, PLT 254K,  Results  Risk Assessment/Calculations: {Does this patient have ATRIAL FIBRILLATION?:[id number]} No BP recorded.  {Refresh Note OR Click here to enter BP  :1}***   STOP-Bang Score:  5  { Consider Dx Sleep Disordered Breathing or Sleep Apnea  ICD G47.33          :1}   Physical Exam:  VS:  There were no vitals taken for this visit.       Wt Readings from Last 3 Encounters:  04/30/24 200 lb (90.7 kg)  03/18/24 200 lb 8 oz (90.9 kg)  03/13/24 195 lb (88.5 kg)    Physical Exam***     Assessment and Plan:    Assessment & Plan Chronic heart failure with preserved ejection fraction (HFpEF) (HCC)  Coronary artery disease involving native coronary artery of native heart without angina pectoris History of prior MI in 1996 and DES to LAD in 2018.  Status post OCT guided PCI with shockwave lithotripsy, orbital  atherectomy and DES to the ostial LAD in September 2023.  Proximal LAD stent was patent and he had moderate nonobstructive disease elsewhere.  *** Syncope and collapse Recent echo with no significant change in LV function and event monitor without significant arrhythmias.*** Benign hypertension  Mixed hyperlipidemia  Assessment and Plan Assessment & Plan    {      :1}    {Are you ordering a CV Procedure (e.g. stress test, cath, DCCV, TEE, etc)?   Press F2        :[id number]}  Dispo:  No follow-ups on file.  Signed, Glendia Ferrier, PA-C

## 2024-05-13 NOTE — Assessment & Plan Note (Signed)
 Recent echo with no significant change in LV function and event monitor without significant arrhythmias.***

## 2024-05-13 NOTE — Assessment & Plan Note (Signed)
 History of prior MI in 1996 and DES to LAD in 2018.  Status post OCT guided PCI with shockwave lithotripsy, orbital atherectomy and DES to the ostial LAD in September 2023.  Proximal LAD stent was patent and he had moderate nonobstructive disease elsewhere.  ***

## 2024-05-14 ENCOUNTER — Ambulatory Visit: Admitting: Physician Assistant

## 2024-05-22 ENCOUNTER — Ambulatory Visit: Admitting: Podiatry

## 2024-05-22 ENCOUNTER — Encounter: Payer: Self-pay | Admitting: Podiatry

## 2024-05-22 DIAGNOSIS — M79609 Pain in unspecified limb: Secondary | ICD-10-CM | POA: Diagnosis not present

## 2024-05-22 DIAGNOSIS — D689 Coagulation defect, unspecified: Secondary | ICD-10-CM | POA: Diagnosis not present

## 2024-05-22 DIAGNOSIS — B351 Tinea unguium: Secondary | ICD-10-CM | POA: Diagnosis not present

## 2024-05-22 NOTE — Progress Notes (Signed)
 This patient returns to my office for at risk foot care.  This patient requires this care by a professional since this patient will be at risk due to having coagulation defect.  Patient is taking plavix.  This patient is unable to cut nails himself since the patient cannot reach his nails.These nails are painful walking and wearing shoes.  This patient presents for at risk foot care today.  General Appearance  Alert, conversant and in no acute stress.  Vascular  Dorsalis pedis and posterior tibial  pulses are palpable  bilaterally.  Capillary return is within normal limits  bilaterally. Temperature is within normal limits  bilaterally.  Neurologic  Senn-Weinstein monofilament wire test within normal limits  bilaterally. Muscle power within normal limits bilaterally.  Nails Thick disfigured discolored nails with subungual debris  from hallux to fifth toes bilaterally. No evidence of bacterial infection or drainage bilaterally.  Orthopedic  No limitations of motion  feet .  No crepitus or effusions noted.  No bony pathology or digital deformities noted.  Skin  normotropic skin with no porokeratosis noted bilaterally.  No signs of infections or ulcers noted.     Onychomycosis  Pain in right toes  Pain in left toes  Consent was obtained for treatment procedures.   Mechanical debridement of nails 1-5  bilaterally performed with a nail nipper.  Filed with dremel without incident.    Return office visit    3   months                  Told patient to return for periodic foot care and evaluation due to potential at risk complications.   Helane Gunther DPM

## 2024-05-23 ENCOUNTER — Ambulatory Visit: Admitting: Neurology

## 2024-06-10 ENCOUNTER — Encounter: Payer: Self-pay | Admitting: Neurology

## 2024-06-10 ENCOUNTER — Ambulatory Visit: Admitting: Neurology

## 2024-06-10 VITALS — BP 129/80 | HR 70 | Ht 68.0 in | Wt 198.6 lb

## 2024-06-10 DIAGNOSIS — R2689 Other abnormalities of gait and mobility: Secondary | ICD-10-CM

## 2024-06-10 DIAGNOSIS — R269 Unspecified abnormalities of gait and mobility: Secondary | ICD-10-CM | POA: Diagnosis not present

## 2024-06-10 DIAGNOSIS — G4440 Drug-induced headache, not elsewhere classified, not intractable: Secondary | ICD-10-CM

## 2024-06-10 DIAGNOSIS — R519 Headache, unspecified: Secondary | ICD-10-CM

## 2024-06-10 DIAGNOSIS — Z9189 Other specified personal risk factors, not elsewhere classified: Secondary | ICD-10-CM

## 2024-06-10 DIAGNOSIS — G939 Disorder of brain, unspecified: Secondary | ICD-10-CM

## 2024-06-10 NOTE — Progress Notes (Signed)
 Subjective:    Patient ID: [name] is a 84 y.o. male.  HPI    True Mar, MD, PhD [facility] [address], [address] [address] [place], [place] [address]  Dear Dr. Verdia,  I saw your patient, [name], upon your kind request in my neurologic clinic today for initial consultation of his recurrent headaches.  The patient is unaccompanied today.  He missed an appointment on 04/12/24. As you know, [name] is an 84 year old male with an underlying complex medical history of coronary artery disease with history of brain lesion removal in the distant past as a child, MI, status post stent placement, chronic systolic congestive heart failure, hypertension, hyperlipidemia, diverticulosis, reflux disease, type 2 diabetes, depression, history of syncope, chronic kidney disease, arthritis with status post bilateral knee replacements, history of GI bleed in 2020 secondary to duodenal ulcers and overweight state, who reports recurrent headaches for the past 3 years.  He had similar headaches when he had his brain lesion he recalls.  He had surgery at age 7 for this.  He reports a fleeting or short-lived headache sometimes which goes through the entire head.  He denies any associated neurological accompaniments, no migraine-like headaches, no sudden onset of one-sided weakness or numbness or tingling or droopy face or slurring of speech.  He does not always hydrate well, admits to drinking maybe 2 bottles of water per day, 1 can of Pepsi or cola per day, occasional coffee in the morning.  He lives alone, he is widowed for 5 years.  He has not had good balance he reports.  He is not sure about all of his medications, not sure if he is still taking methocarbamol , not sure why he is taking sertraline, it was started originally it looks like in July but then restarted in August.  He is also apparently on Viibryd, not sure why he is on 2 different antidepressant medications.   He is unsure if he is still taking Viibryd but it is on the list of his medications on the paper chart from your office.  He takes ibuprofen over-the-counter 1 pill 3 times a day nearly daily, typically every other day or so.  I reviewed your office note from 02/01/2024.  Of note, he is on several medications including torsemide , aspirin , Plavix , Crestor , Imdur , metformin, Mounjaro, Novolin, potassium, sertraline, vilazodone and pantoprazole .  He was started on sertraline recently.  I also reviewed his visit note with Izetta Cork, NP from 01/22/2024.  He reported recent scalp swelling and recurrent morning headaches at the time.  A home sleep test was recently ordered by his cardiologist but it does not look like he had it done yet.  He had a head CT without contrast through [facility] on 01/24/2024 for indication of left posterior headaches, localized swelling and lump in aforementioned area.  I reviewed the results:   IMPRESSION: 1. No acute intracranial abnormality. 2. Stable postoperative changes in the posterior fossa.  He had a brain MRI without contrast on 09/17/2022 with indication of bilateral tinnitus.  I reviewed the results:  IMPRESSION: 1. No acute intracranial abnormality. 2. Inferior cerebellar and left medullary encephalomalacia. 3. Findings of chronic small vessel ischemia and volume loss.   In addition, I personally and independently reviewed images through the PACS system.  He had a brain MRI with and without contrast on 05/27/2019 and I reviewed the results:  IMPRESSION: Postoperative changes in the posterior fossa. Associated chronic blood products with possible superimposed  cavernous malformation given reported history of radiation. There is inferior cerebellar and left inferior medulla encephalomalacia. Associated chronic blood products.  He had blood work in the recent past and I reviewed test results in his electronic chart as well as paper chart.  Hemoglobin  A1c on 11/29/2023 was elevated at 9.5.  On 01/22/2024 his ESR was normal at 8, CMP showed potassium elevated at 5.3, CRP was normal at 8, anti-GAD 65 antibodies were normal, A1c on 12/11/2023 was elevated at 9.0, total cholesterol was 205, LDL elevated at 138. He had a recent heart monitor through cardiology.  He had a Zio patch for 9 days and had episodes of SVT and isolated PVCs with predominant rhythm being sinus rhythm. He is scheduled for a repeat echocardiogram.  I reviewed the report of his last echocardiogram from 09/12/2023.  EF was 45 to 50%, left ventricle had mildly decreased function, he had grade 1 diastolic dysfunction, no evidence of mitral stenosis or regurgitation, aortic valve was not well-visualized and aortic valve regurgitation was not visualized.  He had mild concentric left ventricular hypertrophy.  He feels that he sleeps fairly well, he did not proceed with home sleep testing, he would like to avoid coming in for sleep study and is willing to proceed with a home sleep test.  He is unsure about snoring as he lives alone.  He has an upcoming eye examination this month.  He is usually up-to-date with his eye appointments.   His Past Medical History Is Significant For: Past Medical History:  Diagnosis Date   Adenomatous colon polyp    Arthritis    CAD (coronary artery disease)    a. MI in 1996 with stent placement b. cath 01/18/17 - S/p PCI of in-stent restenosis of pLAD with cutting ballon & DES; 20% ostial LAD; 40% pro Cx; 60% focal pRCA   Clostridium difficile infection    Depression    Diverticulosis    H. pylori infection    HTN (hypertension)    Hyperlipidemia    MI (myocardial infarction) (HCC)    Syncope and collapse 04/10/2024   Monitor 03/2024: NSR, 23 SVT runs (asymptomatic), longest 19 beats, 2.3% PVCs     His Past Surgical History Is Significant For: Past Surgical History:  Procedure Laterality Date   BRAIN TUMOR EXCISION  1954   benign tumor in back of  head, done at  [facility]   CARDIAC CATHETERIZATION     COLONOSCOPY     hx of polyps   CORONARY ANGIOPLASTY WITH STENT PLACEMENT  1996; 1997; 01/18/2017   CORONARY ATHERECTOMY N/A 02/28/2022   Procedure: CORONARY ATHERECTOMY;  Surgeon: Wendel Lurena POUR, MD;  Location: [facility] INVASIVE CV LAB;  Service: Cardiovascular;  Laterality: N/A;   CORONARY IMAGING/OCT N/A 02/28/2022   Procedure: INTRAVASCULAR IMAGING/OCT;  Surgeon: Wendel Lurena POUR, MD;  Location: [facility] INVASIVE CV LAB;  Service: Cardiovascular;  Laterality: N/A;   CORONARY LITHOTRIPSY N/A 02/28/2022   Procedure: CORONARY LITHOTRIPSY;  Surgeon: Wendel Lurena POUR, MD;  Location: [facility] INVASIVE CV LAB;  Service: Cardiovascular;  Laterality: N/A;   CORONARY PRESSURE/FFR STUDY N/A 07/28/2020   Procedure: INTRAVASCULAR PRESSURE WIRE/FFR STUDY;  Surgeon: Anner Alm ORN, MD;  Location: [facility] INVASIVE CV LAB;  Service: Cardiovascular;  Laterality: N/A;   CORONARY PRESSURE/FFR STUDY N/A 02/28/2022   Procedure: INTRAVASCULAR PRESSURE WIRE/FFR STUDY;  Surgeon: Wendel Lurena POUR, MD;  Location: [facility]AS[facility] CV LAB;  Service: Cardiovascular;  Laterality: N/A;   CORONARY STENT INTERVENTION N/A 01/18/2017   Procedure: CORONARY  STENT INTERVENTION;  Surgeon: Burnard Debby LABOR, MD;  Location: [facility] INVASIVE CV LAB;  Service: Cardiovascular;  Laterality: N/A;   CORONARY STENT INTERVENTION N/A 02/28/2022   Procedure: CORONARY STENT INTERVENTION;  Surgeon: Wendel Lurena POUR, MD;  Location: [facility]VAS[facility] CV LAB;  Service: Cardiovascular;  Laterality: N/A;   ESOPHAGOGASTRODUODENOSCOPY (EGD) WITH PROPOFOL  N/A 06/14/2018   Procedure: ESOPHAGOGASTRODUODENOSCOPY (EGD) WITH PROPOFOL ;  Surgeon: Avram Lupita BRAVO, MD;  Location: [facility] ENDOSCOPY;  Service: Endoscopy;  Laterality: N/A;   JOINT REPLACEMENT     LEFT HEART CATH AND CORONARY ANGIOGRAPHY N/A 01/18/2017   Procedure: LEFT HEART CATH AND CORONARY ANGIOGRAPHY;  Surgeon: Burnard Debby LABOR, MD;  Location: [facility] INVASIVE CV LAB;  Service: Cardiovascular;   Laterality: N/A;   LEFT HEART CATH AND CORONARY ANGIOGRAPHY N/A 07/28/2020   Procedure: LEFT HEART CATH AND CORONARY ANGIOGRAPHY;  Surgeon: Anner Alm ORN, MD;  Location: [facility] INVASIVE CV LAB;  Service: Cardiovascular;  Laterality: N/A;   RIGHT/LEFT HEART CATH AND CORONARY ANGIOGRAPHY N/A 02/28/2022   Procedure: RIGHT/LEFT HEART CATH AND CORONARY ANGIOGRAPHY;  Surgeon: Wendel Lurena POUR, MD;  Location: [facility] INVASIVE CV LAB;  Service: Cardiovascular;  Laterality: N/A;   stent x2 in the LAD  with intra-aortic balloon pump support  1996   TONSILLECTOMY     TOTAL KNEE ARTHROPLASTY Bilateral     His Family History Is Significant For: Family History  Problem Relation Age of Onset   Heart attack Mother        27s   Cancer Sister        unsure of type   Heart attack Brother 59   Colon cancer Neg Hx    Stomach cancer Neg Hx    Rectal cancer Neg Hx    Esophageal cancer Neg Hx    Migraines Neg Hx    Seizures Neg Hx    Stroke Neg Hx     His Social History Is Significant For: Social History   Socioeconomic History   Marital status: Widowed    Spouse name: Not on file   Number of children: 1   Years of education: Not on file   Highest education level: Not on file  Occupational History   Occupation: Retired  Tobacco Use   Smoking status: Former    Current packs/day: 0.00    Types: Cigarettes    Quit date: 06/06/1994    Years since quitting: 30.0   Smokeless tobacco: Never  Vaping Use   Vaping status: Never Used  Substance and Sexual Activity   Alcohol use: No   Drug use: No   Sexual activity: Yes  Other Topics Concern   Not on file  Social History Narrative   Some soda every other day    Social Drivers of Health   Tobacco Use: Medium Risk (05/22/2024)   Patient History    Smoking Tobacco Use: Former    Smokeless Tobacco Use: Never    Passive Exposure: Not on Actuary Strain: Not on file  Food Insecurity: No Food Insecurity (09/11/2023)   Hunger Vital Sign     Worried About Running Out of Food in the Last Year: Never true    Ran Out of Food in the Last Year: Never true  Transportation Needs: No Transportation Needs (09/11/2023)   PRAPARE - Administrator, Civil Service (Medical): No    Lack of Transportation (Non-Medical): No  Physical Activity: Not on file  Stress: Not on file  Social Connections: Socially Isolated (09/11/2023)  Social Advertising Account Executive    Frequency of Communication with Friends and Family: Once a week    Frequency of Social Gatherings with Friends and Family: Once a week    Attends Religious Services: More than 4 times per year    Active Member of [employer] or Organizations: No    Attends Banker Meetings: Never    Marital Status: Widowed  Depression (PHQ2-9): Not on file  Alcohol Screen: Not on file  Housing: Low Risk (09/11/2023)   Housing Stability Vital Sign    Unable to Pay for Housing in the Last Year: No    Number of Times Moved in the Last Year: 0    Homeless in the Last Year: No  Utilities: Not At Risk (09/11/2023)   AHC Utilities    Threatened with loss of utilities: No  Health Literacy: Not on file    His Allergies Are:  Allergies[1]:   His Current Medications Are:  Outpatient Encounter Medications as of 06/10/2024  Medication Sig   acetaminophen  (TYLENOL ) 500 MG tablet Take 2 tablets (1,000 mg total) by mouth every 6 (six) hours.   celecoxib (CELEBREX) 100 MG capsule Take 100 mg by mouth daily.   clobetasol (TEMOVATE) 0.05 % external solution Apply 1 Application topically daily.   clopidogrel  (PLAVIX ) 75 MG tablet TAKE 1 TABLET BY MOUTH ONCE DAILY   docusate sodium  (COLACE) 100 MG capsule Take 1 capsule (100 mg total) by mouth 2 (two) times daily.   doxycycline  (VIBRA -TABS) 100 MG tablet Take 1 tablet (100 mg total) by mouth 2 (two) times daily.   escitalopram  (LEXAPRO ) 20 MG tablet Take 20 mg by mouth daily.   fenofibrate  (TRICOR ) 145 MG tablet TAKE ONE TABLET BY MOUTH ONCE  DAILY   ketoconazole (NIZORAL) 2 % cream Apply 1 Application topically daily.   losartan  (COZAAR ) 25 MG tablet Take 1 tablet (25 mg total) by mouth daily.   metFORMIN (GLUCOPHAGE-XR) 500 MG 24 hr tablet Take 1,000 mg by mouth daily with supper.   methocarbamol  (ROBAXIN ) 500 MG tablet Take 1 tablet (500 mg total) by mouth every 8 (eight) hours as needed for muscle spasms (pain after rib fracture).   nitroGLYCERIN  (NITROSTAT ) 0.4 MG SL tablet PLACE 1 TABLET UNDER THE TONGUE EVERY 5 MINTUES AS NEEDED FOR CHEST PAIN   ondansetron  (ZOFRAN ) 4 MG tablet TAKE 1 TABLET BY MOUTH EVERY 12 HOURS AS NEEDED FOR NAUSEA OR VOMITING.   oxyCODONE -acetaminophen  (PERCOCET) 5-325 MG tablet Take 1 tablet by mouth every other day.   pantoprazole  (PROTONIX ) 40 MG tablet Take 1 tablet (40 mg total) by mouth 2 (two) times daily.   polyethylene glycol (MIRALAX  / GLYCOLAX ) 17 g packet Take 17 g by mouth daily as needed (constipation).   Potassium Chloride ER 20 MEQ TBCR Take 1 tablet by mouth daily.   REPATHA SURECLICK 140 MG/ML SOAJ Inject 1 mL into the skin every 14 (fourteen) days.   rosuvastatin  (CRESTOR ) 20 MG tablet TAKE ONE TABLET BY MOUTH ONCE DAILY   sertraline (ZOLOFT) 50 MG tablet Take 50 mg by mouth daily.   sucralfate  (CARAFATE ) 1 g tablet Take 1 tablet (1 g total) by mouth 4 (four) times daily -  with meals and at bedtime.   terbinafine  (LAMISIL ) 250 MG tablet Take 1 tablet (250 mg total) by mouth daily.   Torsemide  40 MG TABS Take 20 mg by mouth daily. Ok to take extra 20 mg if weight increases more than 3 lbs in 1 day.  Vilazodone HCl 20 MG TABS 1 tablet with food Orally Once a day for 30 days   No facility-administered encounter medications on file as of 06/10/2024.  :   Review of Systems:  Out of a complete 14 point review of systems, all are reviewed and negative with the exception of these symptoms as listed below:  Review of Systems  Objective:  Neurological Exam  Physical Exam Physical  Examination:   Vitals:   06/10/24 0746  BP: 129/80  Pulse: 70    General Examination: The patient is a very pleasant 84 y.o. male in no acute distress.  He appears frail and deconditioned, well-groomed.    HEENT: Normocephalic, atraumatic, pupils are equal, round and reactive to light, extraocular tracking is good without limitation to gaze excursion or nystagmus noted. No photophobia, status post cataract repairs, funduscopic exam benign. Hearing is grossly intact.  Face is symmetric with normal facial animation. Speech is clear without dysarthria. There is no hypophonia. There is no lip, neck/head, jaw or voice tremor. Neck is supple with full range of passive and active motion. There are no carotid bruits on auscultation.  Airway examination and oropharynx exam reveals full dentures on top, implants on the bottom, small airway, Mallampati class III, tonsils possibly absent bilaterally.  He reports that he had 1 tonsil removed as a child.  Neck circumference 16 1/8 inches.  Thicker tongue noted, tongue protrudes centrally and palate elevates symmetrically.    Chest: Clear to auscultation without wheezing, rhonchi or crackles noted.  Heart: S1+S2+0, regular and normal without murmurs, rubs or gallops noted.   Abdomen: Soft, non-tender and non-distended.  Extremities: There is no pitting edema in the distal lower extremities bilaterally.   Skin: Warm and dry without trophic changes noted.   Musculoskeletal: exam reveals no obvious joint deformities.   Neurologically:  Mental status: The patient is awake, pays attention but unable to provide a detailed history and unsure about his current medication regimen.    Cranial nerves II - XII are as described above under HEENT exam.   Motor exam: Normal bulk, strength and tone is noted. There is no obvious action or resting tremor.  Fine motor skills and coordination: Intact grossly for age.  Cerebellar testing: No dysmetria or intention  tremor. There is no truncal or gait ataxia.  Sensory exam: intact to light touch in the upper and lower extremities.  Gait, station and balance: He stands slowly and stand slightly wider base, he has no walking aid but walks cautiously and slowly, no shuffling, preserved armswing noted.   Assessment and Plan:   In summary, NICHOLAI WILLETTE is a very pleasant 84 y.o.-year old male with an underlying complex medical history of coronary artery disease with history of brain lesion removal in the distant past as a child, MI, status post stent placement, chronic systolic congestive heart failure, hypertension, hyperlipidemia, diverticulosis, reflux disease, type 2 diabetes, depression, history of syncope, chronic kidney disease, arthritis with status post bilateral knee replacements, history of GI bleed in 2020 secondary to duodenal ulcers and overweight state, who presents for evaluation of his recurrent headaches of several years duration, approximately 3 years, with recurrence noted.  He reports a history of headaches when he had his brain tumor and would like to proceed with a brain MRI even though he had 1 in 2024.  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  We talked about his headache condition, likely a combination type headache including  medication overuse headache, suboptimal hydration related headache, caffeine related headache, sleep disordered breathing not excluded.   We talked about his balance as well.  He is advised to talk to you about his medication regimen.  He is advised that it is unusual to take Viibryd and sertraline together.  He is advised to increase his water intake, certain medications can exacerbate headaches as well including Imdur .    This was an extended visit of over 60 minutes of high complexity with copious record review involved in considerable counseling and coordination of care, addressing multiple issues.    I recommend a home sleep  test at this time.  He was advised to proceed with a laboratory attended sleep study but would prefer a test at home.   Below is a summary of my recommendations and instructions to the patient today, he was given these verbally during the visit and also in writing in his after visit summary.  <<It was nice to meet you today.   As discussed, your headaches are likely due to a combination of factors.   Here is what we discussed today and my recommendations for you:   Please remember, common headache triggers are: sleep deprivation, dehydration, overheating, stress, hypoglycemia or skipping meals and blood sugar fluctuations, excessive pain medications or excessive alcohol use or caffeine withdrawal. Some people have food triggers such as aged cheese, orange juice or chocolate, especially dark chocolate, or MSG (monosodium glutamate). Try to avoid these headache triggers as much possible. It may be helpful to keep a headache diary to figure out what makes your headaches worse or brings them on and what alleviates them. Some people report headache onset after exercise but studies have shown that regular exercise may actually prevent headaches from coming. If you have exercise-induced headaches, please make sure that you drink plenty of fluid before and after exercising and that you do not over do it and do not overheat. Please stop taking ibuprofen, as it can perpetuate headaches and increase your risk for bleeding ulcers.  Reduce your caffeine to 1 serving or less per day, as caffeine can drive headaches. Avoid sodas altogether. Increase your water intake to about 6-8 cups or 3-4 bottles/day, 16.9 oz size. Avoid all alcohol. We will do a brain scan, called MRI and call you with the test results. We will have to schedule you for this on a separate date. This test requires authorization from your insurance, and we will take care of the insurance process. I will order a home sleep test to look for signs  of obstructive sleep apnea (aka OSA). As explained, the long-term risks and ramifications of untreated moderate to severe obstructive sleep apnea may include (but are not limited to): increased risk for cardiovascular disease, including congestive heart failure, stroke, difficult to control hypertension, treatment resistant obesity, arrhythmias, especially irregular heartbeat commonly known as A. Fib. (atrial fibrillation); even type 2 diabetes has been linked to untreated OSA.  Stay up to date on your eye exam.  Talk to your primary care about why you are on 2 antidepressant medications namely vilazodone and sertraline? We will plan a follow up after your sleep test. >>  Thank you very much for allowing me to participate in the care of this nice patient. If I can be of any further assistance to you please do not hesitate to call me at [phone].  Sincerely,   True Mar, MD, PhD     [1]  Allergies Allergen Reactions   Gabapentin Other (  See Comments)    Patient said while taking this medication it caused nightmares and loss of sleep

## 2024-06-10 NOTE — Patient Instructions (Addendum)
 It was nice to meet you today.   As discussed, your headaches are likely due to a combination of factors.   Here is what we discussed today and my recommendations for you:   Please remember, common headache triggers are: sleep deprivation, dehydration, overheating, stress, hypoglycemia or skipping meals and blood sugar fluctuations, excessive pain medications or excessive alcohol use or caffeine withdrawal. Some people have food triggers such as aged cheese, orange juice or chocolate, especially dark chocolate, or MSG (monosodium glutamate). Try to avoid these headache triggers as much possible. It may be helpful to keep a headache diary to figure out what makes your headaches worse or brings them on and what alleviates them. Some people report headache onset after exercise but studies have shown that regular exercise may actually prevent headaches from coming. If you have exercise-induced headaches, please make sure that you drink plenty of fluid before and after exercising and that you do not over do it and do not overheat. Please stop taking ibuprofen, as it can perpetuate headaches and increase your risk for bleeding ulcers.  Reduce your caffeine to 1 serving or less per day, as caffeine can drive headaches. Avoid sodas altogether. Increase your water intake to about 6-8 cups or 3-4 bottles/day, 16.9 oz size. Avoid all alcohol. We will do a brain scan, called MRI and call you with the test results. We will have to schedule you for this on a separate date. This test requires authorization from your insurance, and we will take care of the insurance process. I will order a home sleep test to look for signs of obstructive sleep apnea (aka OSA). As explained, the long-term risks and ramifications of untreated moderate to severe obstructive sleep apnea may include (but are not limited to): increased risk for cardiovascular disease, including congestive heart failure, stroke, difficult to control  hypertension, treatment resistant obesity, arrhythmias, especially irregular heartbeat commonly known as A. Fib. (atrial fibrillation); even type 2 diabetes has been linked to untreated OSA.  Stay up to date on your eye exam.  Talk to your primary care about why you are on 2 antidepressant medications namely vilazodone and sertraline? We will plan a follow up after your sleep test.

## 2024-06-11 ENCOUNTER — Telehealth: Payer: Self-pay | Admitting: Neurology

## 2024-06-11 NOTE — Telephone Encounter (Signed)
 no auth required sent to GI [phone]

## 2024-07-09 ENCOUNTER — Telehealth: Payer: Self-pay | Admitting: Podiatry

## 2024-07-09 ENCOUNTER — Ambulatory Visit
Admission: RE | Admit: 2024-07-09 | Discharge: 2024-07-09 | Disposition: A | Source: Ambulatory Visit | Attending: Neurology | Admitting: Neurology

## 2024-07-09 DIAGNOSIS — Z9189 Other specified personal risk factors, not elsewhere classified: Secondary | ICD-10-CM

## 2024-07-09 DIAGNOSIS — R519 Headache, unspecified: Secondary | ICD-10-CM

## 2024-07-09 DIAGNOSIS — G939 Disorder of brain, unspecified: Secondary | ICD-10-CM

## 2024-07-09 DIAGNOSIS — G4440 Drug-induced headache, not elsewhere classified, not intractable: Secondary | ICD-10-CM

## 2024-07-09 MED ORDER — GADOPICLENOL 0.5 MMOL/ML IV SOLN
9.0000 mL | Freq: Once | INTRAVENOUS | Status: AC | PRN
  Administered 2024-07-09: 9 mL via INTRAVENOUS

## 2024-07-09 NOTE — Telephone Encounter (Signed)
 Patient called back at 3:43 today and decided to tell provider to disregard message sent earlier, he will schedule an appointment

## 2024-07-10 ENCOUNTER — Ambulatory Visit: Admitting: Podiatry

## 2024-07-10 ENCOUNTER — Telehealth: Payer: Self-pay | Admitting: Lab

## 2024-07-10 DIAGNOSIS — M7751 Other enthesopathy of right foot: Secondary | ICD-10-CM

## 2024-07-10 DIAGNOSIS — M10071 Idiopathic gout, right ankle and foot: Secondary | ICD-10-CM

## 2024-07-10 MED ORDER — COLCHICINE 0.6 MG PO TABS: 0.6000 mg | ORAL_TABLET | Freq: Every day | ORAL | 0 refills | Status: AC

## 2024-07-10 MED ORDER — METHYLPREDNISOLONE 4 MG PO TBPK: ORAL_TABLET | ORAL | 0 refills | Status: AC

## 2024-07-10 NOTE — Telephone Encounter (Signed)
 Patient advised

## 2024-07-10 NOTE — Telephone Encounter (Signed)
 Patient at pharmacy nothing called in please review.

## 2024-07-10 NOTE — Progress Notes (Signed)
 "  Subjective:  Patient ID: [name], male    DOB: [birth year],  MRN: [id number]  Chief Complaint  Patient presents with   Foot Pain    Pt stated that he has been having some discomfort in his right foot he is concerned that he may have gout     84 y.o. male presents with the above complaint.  Patient presents with new complaint of right first metatarsal phalangeal joint capsulitis with underlying gout flareup.  He has a history of gout.  He has a high intake and red meat diet.  He wanted to discuss treatment options for this.  He states that started getting flared up over the last week is progressing and worse and painful would like to discuss treatment options for it.  He is currently not taking any medication.   Review of Systems: Negative except as noted in the HPI. Denies N/V/F/Ch.  Past Medical History:  Diagnosis Date   Adenomatous colon polyp    Arthritis    CAD (coronary artery disease)    a. MI in 1996 with stent placement b. cath 01/18/17 - S/p PCI of in-stent restenosis of pLAD with cutting ballon & DES; 20% ostial LAD; 40% pro Cx; 60% focal pRCA   Clostridium difficile infection    Depression    Diverticulosis    H. pylori infection    HTN (hypertension)    Hyperlipidemia    MI (myocardial infarction) (HCC)    Syncope and collapse 04/10/2024   Monitor 03/2024: NSR, 23 SVT runs (asymptomatic), longest 19 beats, 2.3% PVCs    Current Medications[1]  Tobacco Use History[2]  Allergies[3] Objective:  There were no vitals filed for this visit. There is no height or weight on file to calculate BMI. Constitutional Well developed. Well nourished.  Vascular Dorsalis pedis pulses palpable bilaterally. Posterior tibial pulses palpable bilaterally. Capillary refill normal to all digits.  No cyanosis or clubbing noted. Pedal hair growth normal.  Neurologic Normal speech. Oriented to person, place, and time. Epicritic sensation to light touch grossly present  bilaterally.  Dermatologic Nails well groomed and normal in appearance. No open wounds. No skin lesions.  Orthopedic: Pain on palpation to the right hallux interphalangeal joint pain.  Red hot swollen joint noted.  Pain on palpation to the joint pain with range of motion of the interphalangeal joint no pain with range of motion of the metatarsophalangeal joint.  Some mild swelling noted at the ankle joint   Radiographs: None Assessment:   1. Acute idiopathic gout involving toe of right foot   2. Capsulitis of toe, right    Plan:  Patient was evaluated and treated and all questions answered.  Right hallux interphalangeal joint capsulitis with underlying exacerbation of chronic gout  disease - All questions and concerns were discussed with the patient - I discussed with the patient that he will benefit from a steroid injection to help decrease of inflammatory compensation of pain.  Patient agrees with plan to proceed with steroid injection -A steroid injection was performed at right hallux interphalangeal joint capsulitis using 1% plain Lidocaine  and 10 mg of Kenalog . This was well tolerated. - I extensively discussed gout management and including reduction of red meat diet - Medrol  Dosepak and meloxicam was prescribed   No follow-ups on file.     [1]  Current Outpatient Medications:    colchicine  0.6 MG tablet, Take 1 tablet (0.6 mg total) by mouth daily., Disp: 30 tablet, Rfl: 0   methylPREDNISolone  (MEDROL  DOSEPAK)  4 MG TBPK tablet, Take as directed, Disp: 21 each, Rfl: 0   acetaminophen  (TYLENOL ) 500 MG tablet, Take 2 tablets (1,000 mg total) by mouth every 6 (six) hours., Disp: 30 tablet, Rfl: 0   celecoxib (CELEBREX) 100 MG capsule, Take 100 mg by mouth daily., Disp: , Rfl:    clobetasol (TEMOVATE) 0.05 % external solution, Apply 1 Application topically daily., Disp: , Rfl:    clopidogrel  (PLAVIX ) 75 MG tablet, TAKE 1 TABLET BY MOUTH ONCE DAILY, Disp: 30 tablet, Rfl: 9    docusate sodium  (COLACE) 100 MG capsule, Take 1 capsule (100 mg total) by mouth 2 (two) times daily., Disp: 10 capsule, Rfl: 0   doxycycline  (VIBRA -TABS) 100 MG tablet, Take 1 tablet (100 mg total) by mouth 2 (two) times daily., Disp: 20 tablet, Rfl: 0   escitalopram  (LEXAPRO ) 20 MG tablet, Take 20 mg by mouth daily., Disp: , Rfl:    fenofibrate  (TRICOR ) 145 MG tablet, TAKE ONE TABLET BY MOUTH ONCE DAILY, Disp: 90 tablet, Rfl: 2   ketoconazole (NIZORAL) 2 % cream, Apply 1 Application topically daily., Disp: , Rfl:    losartan  (COZAAR ) 25 MG tablet, Take 1 tablet (25 mg total) by mouth daily., Disp: 30 tablet, Rfl: 0   metFORMIN (GLUCOPHAGE-XR) 500 MG 24 hr tablet, Take 1,000 mg by mouth daily with supper., Disp: , Rfl:    methocarbamol  (ROBAXIN ) 500 MG tablet, Take 1 tablet (500 mg total) by mouth every 8 (eight) hours as needed for muscle spasms (pain after rib fracture)., Disp: 30 tablet, Rfl: 0   nitroGLYCERIN  (NITROSTAT ) 0.4 MG SL tablet, PLACE 1 TABLET UNDER THE TONGUE EVERY 5 MINTUES AS NEEDED FOR CHEST PAIN, Disp: 25 tablet, Rfl: 11   ondansetron  (ZOFRAN ) 4 MG tablet, TAKE 1 TABLET BY MOUTH EVERY 12 HOURS AS NEEDED FOR NAUSEA OR VOMITING., Disp: 30 tablet, Rfl: 3   oxyCODONE -acetaminophen  (PERCOCET) 5-325 MG tablet, Take 1 tablet by mouth every other day., Disp: , Rfl:    pantoprazole  (PROTONIX ) 40 MG tablet, Take 1 tablet (40 mg total) by mouth 2 (two) times daily., Disp: 60 tablet, Rfl: 2   polyethylene glycol (MIRALAX  / GLYCOLAX ) 17 g packet, Take 17 g by mouth daily as needed (constipation)., Disp: , Rfl:    Potassium Chloride ER 20 MEQ TBCR, Take 1 tablet by mouth daily., Disp: , Rfl:    REPATHA SURECLICK 140 MG/ML SOAJ, Inject 1 mL into the skin every 14 (fourteen) days., Disp: , Rfl:    rosuvastatin  (CRESTOR ) 20 MG tablet, TAKE ONE TABLET BY MOUTH ONCE DAILY, Disp: 90 tablet, Rfl: 0   sertraline (ZOLOFT) 50 MG tablet, Take 50 mg by mouth daily., Disp: , Rfl:    sucralfate  (CARAFATE ) 1  g tablet, Take 1 tablet (1 g total) by mouth 4 (four) times daily -  with meals and at bedtime., Disp: 120 tablet, Rfl: 1   terbinafine  (LAMISIL ) 250 MG tablet, Take 1 tablet (250 mg total) by mouth daily., Disp: 30 tablet, Rfl: 0   Torsemide  40 MG TABS, Take 20 mg by mouth daily. Ok to take extra 20 mg if weight increases more than 3 lbs in 1 day., Disp: , Rfl:    Vilazodone HCl 20 MG TABS, 1 tablet with food Orally Once a day for 30 days, Disp: , Rfl:  [2]  Social History Tobacco Use  Smoking Status Former   Current packs/day: 0.00   Types: Cigarettes   Quit date: 06/06/1994   Years since quitting: 30.1  Smokeless Tobacco  Never  [3]  Allergies Allergen Reactions   Gabapentin Other (See Comments)    Patient said while taking this medication it caused nightmares and loss of sleep   "

## 2024-08-20 ENCOUNTER — Ambulatory Visit: Admitting: Podiatry
